# Patient Record
Sex: Male | Born: 1940 | Race: White | Hispanic: No | Marital: Married | Smoking: Never smoker
Health system: Southern US, Community
[De-identification: ages and names within clinical notes are randomized; demographics above are authoritative.]

## PROBLEM LIST (undated history)

## (undated) DIAGNOSIS — R350 Frequency of micturition: Secondary | ICD-10-CM

## (undated) DIAGNOSIS — T7840XA Allergy, unspecified, initial encounter: Secondary | ICD-10-CM

## (undated) DIAGNOSIS — M545 Low back pain, unspecified: Secondary | ICD-10-CM

## (undated) DIAGNOSIS — K219 Gastro-esophageal reflux disease without esophagitis: Secondary | ICD-10-CM

## (undated) DIAGNOSIS — K760 Fatty (change of) liver, not elsewhere classified: Secondary | ICD-10-CM

## (undated) DIAGNOSIS — G629 Polyneuropathy, unspecified: Secondary | ICD-10-CM

## (undated) DIAGNOSIS — C182 Malignant neoplasm of ascending colon: Principal | ICD-10-CM

## (undated) DIAGNOSIS — K5730 Diverticulosis of large intestine without perforation or abscess without bleeding: Secondary | ICD-10-CM

## (undated) DIAGNOSIS — J302 Other seasonal allergic rhinitis: Secondary | ICD-10-CM

## (undated) DIAGNOSIS — M1990 Unspecified osteoarthritis, unspecified site: Secondary | ICD-10-CM

## (undated) DIAGNOSIS — K8020 Calculus of gallbladder without cholecystitis without obstruction: Secondary | ICD-10-CM

## (undated) DIAGNOSIS — E291 Testicular hypofunction: Secondary | ICD-10-CM

## (undated) DIAGNOSIS — C801 Malignant (primary) neoplasm, unspecified: Secondary | ICD-10-CM

## (undated) DIAGNOSIS — H269 Unspecified cataract: Secondary | ICD-10-CM

## (undated) HISTORY — DX: Fatty (change of) liver, not elsewhere classified: K76.0

## (undated) HISTORY — PX: APPENDECTOMY: SHX54

## (undated) HISTORY — DX: Allergy, unspecified, initial encounter: T78.40XA

## (undated) HISTORY — DX: Malignant neoplasm of ascending colon: C18.2

## (undated) HISTORY — PX: COLON SURGERY: SHX602

## (undated) HISTORY — PX: LUMBAR LAMINECTOMY: SHX95

## (undated) HISTORY — PX: EYE SURGERY: SHX253

## (undated) HISTORY — DX: Diverticulosis of large intestine without perforation or abscess without bleeding: K57.30

## (undated) HISTORY — DX: Low back pain, unspecified: M54.50

## (undated) HISTORY — DX: Low back pain: M54.5

## (undated) HISTORY — DX: Gastro-esophageal reflux disease without esophagitis: K21.9

## (undated) HISTORY — PX: POLYPECTOMY: SHX149

## (undated) HISTORY — PX: CATARACT EXTRACTION: SUR2

## (undated) HISTORY — DX: Calculus of gallbladder without cholecystitis without obstruction: K80.20

## (undated) HISTORY — DX: Unspecified osteoarthritis, unspecified site: M19.90

## (undated) HISTORY — DX: Unspecified cataract: H26.9

## (undated) HISTORY — DX: Testicular hypofunction: E29.1

## (undated) HISTORY — DX: Polyneuropathy, unspecified: G62.9

## (undated) HISTORY — PX: TONSILLECTOMY: SUR1361

---

## 1964-07-18 HISTORY — PX: FOOT SURGERY: SHX648

## 1998-08-31 ENCOUNTER — Emergency Department (HOSPITAL_COMMUNITY): Admission: EM | Admit: 1998-08-31 | Discharge: 1998-08-31 | Payer: Self-pay | Admitting: Emergency Medicine

## 1998-09-01 ENCOUNTER — Encounter: Payer: Self-pay | Admitting: Emergency Medicine

## 1998-09-09 ENCOUNTER — Ambulatory Visit (HOSPITAL_COMMUNITY): Admission: RE | Admit: 1998-09-09 | Discharge: 1998-09-09 | Payer: Self-pay | Admitting: Gastroenterology

## 1998-09-09 ENCOUNTER — Encounter: Payer: Self-pay | Admitting: Gastroenterology

## 2000-02-24 ENCOUNTER — Encounter: Payer: Self-pay | Admitting: Internal Medicine

## 2000-02-24 ENCOUNTER — Ambulatory Visit (HOSPITAL_COMMUNITY): Admission: RE | Admit: 2000-02-24 | Discharge: 2000-02-24 | Payer: Self-pay | Admitting: Internal Medicine

## 2004-05-26 ENCOUNTER — Ambulatory Visit: Payer: Self-pay | Admitting: Internal Medicine

## 2004-06-23 ENCOUNTER — Ambulatory Visit: Payer: Self-pay | Admitting: Internal Medicine

## 2004-06-28 ENCOUNTER — Ambulatory Visit: Payer: Self-pay | Admitting: Internal Medicine

## 2004-07-26 ENCOUNTER — Ambulatory Visit: Payer: Self-pay | Admitting: Internal Medicine

## 2004-09-28 ENCOUNTER — Ambulatory Visit: Payer: Self-pay | Admitting: Internal Medicine

## 2004-10-05 ENCOUNTER — Ambulatory Visit: Payer: Self-pay | Admitting: Internal Medicine

## 2004-12-09 ENCOUNTER — Ambulatory Visit: Payer: Self-pay | Admitting: Internal Medicine

## 2004-12-29 ENCOUNTER — Ambulatory Visit: Payer: Self-pay | Admitting: Internal Medicine

## 2005-01-05 ENCOUNTER — Ambulatory Visit: Payer: Self-pay | Admitting: Internal Medicine

## 2005-03-10 ENCOUNTER — Ambulatory Visit: Payer: Self-pay | Admitting: Internal Medicine

## 2005-04-08 ENCOUNTER — Ambulatory Visit: Payer: Self-pay | Admitting: Internal Medicine

## 2005-05-10 ENCOUNTER — Ambulatory Visit: Payer: Self-pay | Admitting: Internal Medicine

## 2005-07-04 ENCOUNTER — Ambulatory Visit: Payer: Self-pay | Admitting: Internal Medicine

## 2005-07-19 ENCOUNTER — Ambulatory Visit: Payer: Self-pay | Admitting: Internal Medicine

## 2005-09-08 ENCOUNTER — Ambulatory Visit: Payer: Self-pay | Admitting: Internal Medicine

## 2005-10-25 ENCOUNTER — Ambulatory Visit: Payer: Self-pay | Admitting: Internal Medicine

## 2005-10-31 ENCOUNTER — Ambulatory Visit: Payer: Self-pay | Admitting: Internal Medicine

## 2005-11-21 ENCOUNTER — Ambulatory Visit: Payer: Self-pay | Admitting: Gastroenterology

## 2005-11-30 ENCOUNTER — Ambulatory Visit: Payer: Self-pay | Admitting: Gastroenterology

## 2006-03-02 ENCOUNTER — Ambulatory Visit: Payer: Self-pay | Admitting: Internal Medicine

## 2006-03-16 ENCOUNTER — Ambulatory Visit: Payer: Self-pay | Admitting: Internal Medicine

## 2006-04-04 ENCOUNTER — Ambulatory Visit: Payer: Self-pay | Admitting: Internal Medicine

## 2006-04-13 ENCOUNTER — Ambulatory Visit: Payer: Self-pay | Admitting: Internal Medicine

## 2006-05-19 ENCOUNTER — Ambulatory Visit: Payer: Self-pay | Admitting: Internal Medicine

## 2006-07-26 ENCOUNTER — Ambulatory Visit: Payer: Self-pay | Admitting: Internal Medicine

## 2006-10-27 ENCOUNTER — Ambulatory Visit: Payer: Self-pay | Admitting: Internal Medicine

## 2006-10-27 LAB — CONVERTED CEMR LAB
HCT: 43.1 % (ref 39.0–52.0)
Hemoglobin: 14.8 g/dL (ref 13.0–17.0)

## 2006-12-29 ENCOUNTER — Ambulatory Visit: Payer: Self-pay | Admitting: Internal Medicine

## 2006-12-29 LAB — CONVERTED CEMR LAB
ALT: 33 units/L (ref 0–40)
AST: 25 units/L (ref 0–37)
Bilirubin, Direct: 0.1 mg/dL (ref 0.0–0.3)
CO2: 31 meq/L (ref 19–32)
Calcium: 9.6 mg/dL (ref 8.4–10.5)
Chloride: 104 meq/L (ref 96–112)
Creatinine, Ser: 0.8 mg/dL (ref 0.4–1.5)
GFR calc non Af Amer: 104 mL/min
Glucose, Bld: 187 mg/dL — ABNORMAL HIGH (ref 70–99)
Hemoglobin: 15.2 g/dL (ref 13.0–17.0)
Hgb A1c MFr Bld: 7.5 % — ABNORMAL HIGH (ref 4.6–6.0)
TSH: 0.67 microintl units/mL (ref 0.35–5.50)
Total Bilirubin: 0.7 mg/dL (ref 0.3–1.2)
Vitamin B-12: 431 pg/mL (ref 211–911)

## 2007-02-16 DIAGNOSIS — E119 Type 2 diabetes mellitus without complications: Secondary | ICD-10-CM | POA: Insufficient documentation

## 2007-02-16 DIAGNOSIS — K5730 Diverticulosis of large intestine without perforation or abscess without bleeding: Secondary | ICD-10-CM | POA: Insufficient documentation

## 2007-02-16 DIAGNOSIS — K219 Gastro-esophageal reflux disease without esophagitis: Secondary | ICD-10-CM | POA: Insufficient documentation

## 2007-03-05 ENCOUNTER — Ambulatory Visit: Payer: Self-pay | Admitting: Internal Medicine

## 2007-03-05 LAB — CONVERTED CEMR LAB
Basophils Absolute: 0 10*3/uL (ref 0.0–0.1)
Eosinophils Absolute: 0.1 10*3/uL (ref 0.0–0.6)
Eosinophils Relative: 1.6 % (ref 0.0–5.0)
MCHC: 35.7 g/dL (ref 30.0–36.0)
MCV: 94.7 fL (ref 78.0–100.0)
Platelets: 242 10*3/uL (ref 150–400)
RBC: 4.54 M/uL (ref 4.22–5.81)
WBC: 5.9 10*3/uL (ref 4.5–10.5)

## 2007-05-07 ENCOUNTER — Encounter: Payer: Self-pay | Admitting: Internal Medicine

## 2007-05-07 ENCOUNTER — Ambulatory Visit: Payer: Self-pay | Admitting: Internal Medicine

## 2007-05-07 DIAGNOSIS — M545 Low back pain, unspecified: Secondary | ICD-10-CM | POA: Insufficient documentation

## 2007-05-07 DIAGNOSIS — M1990 Unspecified osteoarthritis, unspecified site: Secondary | ICD-10-CM | POA: Insufficient documentation

## 2007-05-07 LAB — CONVERTED CEMR LAB: Hemoglobin: 15.3 g/dL (ref 13.0–17.0)

## 2007-07-09 ENCOUNTER — Ambulatory Visit: Payer: Self-pay | Admitting: Internal Medicine

## 2007-07-09 DIAGNOSIS — G609 Hereditary and idiopathic neuropathy, unspecified: Secondary | ICD-10-CM | POA: Insufficient documentation

## 2007-07-10 LAB — CONVERTED CEMR LAB
Basophils Relative: 0.6 % (ref 0.0–1.0)
CO2: 32 meq/L (ref 19–32)
Calcium: 9.9 mg/dL (ref 8.4–10.5)
Chloride: 97 meq/L (ref 96–112)
Creatinine, Ser: 0.7 mg/dL (ref 0.4–1.5)
Eosinophils Relative: 1.1 % (ref 0.0–5.0)
GFR calc non Af Amer: 121 mL/min
Glucose, Bld: 98 mg/dL (ref 70–99)
Platelets: 243 10*3/uL (ref 150–400)
RBC: 4.91 M/uL (ref 4.22–5.81)
WBC: 7.5 10*3/uL (ref 4.5–10.5)

## 2007-10-09 ENCOUNTER — Ambulatory Visit: Payer: Self-pay | Admitting: Internal Medicine

## 2007-10-09 LAB — CONVERTED CEMR LAB: HCT: 47.1 % (ref 39.0–52.0)

## 2007-12-12 ENCOUNTER — Ambulatory Visit: Payer: Self-pay | Admitting: Internal Medicine

## 2007-12-14 LAB — CONVERTED CEMR LAB
BUN: 9 mg/dL (ref 6–23)
Eosinophils Relative: 0.6 % (ref 0.0–5.0)
GFR calc Af Amer: 125 mL/min
Glucose, Bld: 149 mg/dL — ABNORMAL HIGH (ref 70–99)
HCT: 48.6 % (ref 39.0–52.0)
Hemoglobin: 16.3 g/dL (ref 13.0–17.0)
Monocytes Absolute: 0.7 10*3/uL (ref 0.1–1.0)
Monocytes Relative: 11 % (ref 3.0–12.0)
Platelets: 280 10*3/uL (ref 150–400)
Potassium: 4.1 meq/L (ref 3.5–5.1)
WBC: 6.1 10*3/uL (ref 4.5–10.5)

## 2008-01-25 ENCOUNTER — Ambulatory Visit: Payer: Self-pay | Admitting: Internal Medicine

## 2008-01-25 LAB — CONVERTED CEMR LAB: HCT: 45.7 % (ref 39.0–52.0)

## 2008-05-02 ENCOUNTER — Ambulatory Visit: Payer: Self-pay | Admitting: Internal Medicine

## 2008-05-05 LAB — CONVERTED CEMR LAB
BUN: 7 mg/dL (ref 6–23)
Basophils Relative: 0.7 % (ref 0.0–3.0)
Calcium: 9.4 mg/dL (ref 8.4–10.5)
Creatinine, Ser: 0.6 mg/dL (ref 0.4–1.5)
Eosinophils Relative: 1.5 % (ref 0.0–5.0)
GFR calc Af Amer: 174 mL/min
Glucose, Bld: 177 mg/dL — ABNORMAL HIGH (ref 70–99)
HCT: 44.5 % (ref 39.0–52.0)
Hemoglobin: 15.8 g/dL (ref 13.0–17.0)
Monocytes Absolute: 0.6 10*3/uL (ref 0.1–1.0)
Monocytes Relative: 12.5 % — ABNORMAL HIGH (ref 3.0–12.0)
Neutro Abs: 2.5 10*3/uL (ref 1.4–7.7)
RBC: 4.63 M/uL (ref 4.22–5.81)
RDW: 11.5 % (ref 11.5–14.6)
TSH: 1.13 microintl units/mL (ref 0.35–5.50)
WBC: 5 10*3/uL (ref 4.5–10.5)

## 2008-07-21 ENCOUNTER — Ambulatory Visit: Payer: Self-pay | Admitting: Internal Medicine

## 2008-07-21 DIAGNOSIS — N529 Male erectile dysfunction, unspecified: Secondary | ICD-10-CM

## 2008-07-21 DIAGNOSIS — R109 Unspecified abdominal pain: Secondary | ICD-10-CM | POA: Insufficient documentation

## 2008-07-21 DIAGNOSIS — R21 Rash and other nonspecific skin eruption: Secondary | ICD-10-CM | POA: Insufficient documentation

## 2008-07-21 DIAGNOSIS — K5732 Diverticulitis of large intestine without perforation or abscess without bleeding: Secondary | ICD-10-CM | POA: Insufficient documentation

## 2008-07-22 ENCOUNTER — Ambulatory Visit: Payer: Self-pay | Admitting: Internal Medicine

## 2008-07-23 LAB — CONVERTED CEMR LAB
ALT: 102 units/L — ABNORMAL HIGH (ref 0–53)
AST: 79 units/L — ABNORMAL HIGH (ref 0–37)
Albumin: 4.6 g/dL (ref 3.5–5.2)
Alkaline Phosphatase: 60 units/L (ref 39–117)
BUN: 7 mg/dL (ref 6–23)
Bilirubin, Direct: 0.1 mg/dL (ref 0.0–0.3)
CO2: 29 meq/L (ref 19–32)
Eosinophils Relative: 0.6 % (ref 0.0–5.0)
GFR calc Af Amer: 146 mL/min
Glucose, Bld: 137 mg/dL — ABNORMAL HIGH (ref 70–99)
HCT: 47.8 % (ref 39.0–52.0)
Hemoglobin, Urine: NEGATIVE
Hemoglobin: 16.6 g/dL (ref 13.0–17.0)
Leukocytes, UA: NEGATIVE
Lymphocytes Relative: 27.3 % (ref 12.0–46.0)
Monocytes Absolute: 0.7 10*3/uL (ref 0.1–1.0)
Monocytes Relative: 9.9 % (ref 3.0–12.0)
Nitrite: NEGATIVE
Platelets: 229 10*3/uL (ref 150–400)
Potassium: 3.8 meq/L (ref 3.5–5.1)
Sodium: 139 meq/L (ref 135–145)
Total Protein, Urine: NEGATIVE mg/dL
Total Protein: 7.9 g/dL (ref 6.0–8.3)
WBC: 6.9 10*3/uL (ref 4.5–10.5)
pH: 5.5 (ref 5.0–8.0)

## 2008-07-28 ENCOUNTER — Telehealth: Payer: Self-pay | Admitting: Internal Medicine

## 2008-08-06 ENCOUNTER — Ambulatory Visit: Payer: Self-pay | Admitting: Endocrinology

## 2008-08-06 DIAGNOSIS — R945 Abnormal results of liver function studies: Secondary | ICD-10-CM

## 2008-08-06 LAB — CONVERTED CEMR LAB
ALT: 64 units/L — ABNORMAL HIGH (ref 0–53)
AST: 39 units/L — ABNORMAL HIGH (ref 0–37)
Albumin: 3.9 g/dL (ref 3.5–5.2)
Total Bilirubin: 1 mg/dL (ref 0.3–1.2)
Total Protein: 7.1 g/dL (ref 6.0–8.3)

## 2008-08-11 DIAGNOSIS — K589 Irritable bowel syndrome without diarrhea: Secondary | ICD-10-CM

## 2008-08-13 DIAGNOSIS — K760 Fatty (change of) liver, not elsewhere classified: Secondary | ICD-10-CM

## 2008-08-14 ENCOUNTER — Ambulatory Visit: Payer: Self-pay | Admitting: Gastroenterology

## 2008-08-15 ENCOUNTER — Encounter: Payer: Self-pay | Admitting: Gastroenterology

## 2008-08-22 ENCOUNTER — Ambulatory Visit (HOSPITAL_COMMUNITY): Admission: RE | Admit: 2008-08-22 | Discharge: 2008-08-22 | Payer: Self-pay | Admitting: Gastroenterology

## 2008-08-22 LAB — CONVERTED CEMR LAB
Amylase: 55 units/L (ref 27–131)
Ferritin: 615.4 ng/mL — ABNORMAL HIGH (ref 22.0–322.0)
Lipase: 20 units/L (ref 11.0–59.0)
Saturation Ratios: 62.3 % — ABNORMAL HIGH (ref 20.0–50.0)
Vitamin B-12: 840 pg/mL (ref 211–911)

## 2008-08-26 ENCOUNTER — Ambulatory Visit: Payer: Self-pay | Admitting: Internal Medicine

## 2008-08-26 ENCOUNTER — Ambulatory Visit: Payer: Self-pay | Admitting: Gastroenterology

## 2008-08-26 LAB — CONVERTED CEMR LAB: HCT: 47 % (ref 39.0–52.0)

## 2008-09-01 ENCOUNTER — Telehealth: Payer: Self-pay | Admitting: Internal Medicine

## 2008-09-24 ENCOUNTER — Telehealth: Payer: Self-pay | Admitting: Internal Medicine

## 2008-10-10 ENCOUNTER — Ambulatory Visit: Payer: Self-pay | Admitting: Internal Medicine

## 2008-10-14 LAB — CONVERTED CEMR LAB
ALT: 74 units/L — ABNORMAL HIGH (ref 0–53)
AST: 54 units/L — ABNORMAL HIGH (ref 0–37)
Albumin: 4 g/dL (ref 3.5–5.2)
Alkaline Phosphatase: 72 units/L (ref 39–117)
CO2: 33 meq/L — ABNORMAL HIGH (ref 19–32)
Chloride: 100 meq/L (ref 96–112)
GFR calc non Af Amer: 120.11 mL/min (ref >60)
Glucose, Bld: 174 mg/dL — ABNORMAL HIGH (ref 70–99)
Hemoglobin: 15.9 g/dL (ref 13.0–17.0)
Potassium: 4.4 meq/L (ref 3.5–5.1)
Sodium: 138 meq/L (ref 135–145)

## 2008-10-28 ENCOUNTER — Telehealth: Payer: Self-pay | Admitting: Internal Medicine

## 2008-11-20 ENCOUNTER — Encounter: Payer: Self-pay | Admitting: Gastroenterology

## 2009-01-13 ENCOUNTER — Encounter: Payer: Self-pay | Admitting: Internal Medicine

## 2009-01-14 ENCOUNTER — Ambulatory Visit: Payer: Self-pay | Admitting: Internal Medicine

## 2009-01-20 LAB — CONVERTED CEMR LAB
Basophils Absolute: 0 10*3/uL (ref 0.0–0.1)
Eosinophils Relative: 1.9 % (ref 0.0–5.0)
HCT: 45.5 % (ref 39.0–52.0)
Hemoglobin: 15.7 g/dL (ref 13.0–17.0)
Lymphs Abs: 1.8 10*3/uL (ref 0.7–4.0)
Monocytes Relative: 13.2 % — ABNORMAL HIGH (ref 3.0–12.0)
Neutro Abs: 2.5 10*3/uL (ref 1.4–7.7)
RDW: 11.8 % (ref 11.5–14.6)

## 2009-02-16 ENCOUNTER — Encounter: Payer: Self-pay | Admitting: Internal Medicine

## 2009-03-27 ENCOUNTER — Ambulatory Visit: Payer: Self-pay | Admitting: Internal Medicine

## 2009-03-27 LAB — CONVERTED CEMR LAB: Hemoglobin: 16.1 g/dL (ref 13.0–17.0)

## 2009-06-19 ENCOUNTER — Ambulatory Visit: Payer: Self-pay | Admitting: Internal Medicine

## 2009-06-19 LAB — CONVERTED CEMR LAB
AST: 24 units/L (ref 0–37)
Albumin: 3.9 g/dL (ref 3.5–5.2)
CO2: 32 meq/L (ref 19–32)
Chloride: 101 meq/L (ref 96–112)
Cholesterol: 190 mg/dL (ref 0–200)
HDL: 33.5 mg/dL — ABNORMAL LOW (ref >39.00)
LDL Cholesterol: 129 mg/dL — ABNORMAL HIGH (ref 0–99)
Sodium: 140 meq/L (ref 135–145)
Triglycerides: 140 mg/dL (ref 0.0–149.0)
VLDL: 28 mg/dL (ref 0.0–40.0)

## 2009-06-26 ENCOUNTER — Ambulatory Visit: Payer: Self-pay | Admitting: Internal Medicine

## 2009-06-26 LAB — CONVERTED CEMR LAB
Eosinophils Absolute: 0.1 10*3/uL (ref 0.0–0.7)
Eosinophils Relative: 2 % (ref 0.0–5.0)
HCT: 45.8 % (ref 39.0–52.0)
Lymphs Abs: 1.4 10*3/uL (ref 0.7–4.0)
MCHC: 34.4 g/dL (ref 30.0–36.0)
MCV: 97.5 fL (ref 78.0–100.0)
Monocytes Absolute: 0.7 10*3/uL (ref 0.1–1.0)
Neutrophils Relative %: 54.8 % (ref 43.0–77.0)
Platelets: 220 10*3/uL (ref 150.0–400.0)
WBC: 5 10*3/uL (ref 4.5–10.5)

## 2009-07-18 HISTORY — PX: HEMORRHOID SURGERY: SHX153

## 2009-09-10 ENCOUNTER — Telehealth: Payer: Self-pay | Admitting: Internal Medicine

## 2009-09-23 ENCOUNTER — Ambulatory Visit: Payer: Self-pay | Admitting: Internal Medicine

## 2009-09-23 LAB — CONVERTED CEMR LAB
Albumin: 4 g/dL (ref 3.5–5.2)
Alkaline Phosphatase: 76 units/L (ref 39–117)
BUN: 7 mg/dL (ref 6–23)
Calcium: 9.4 mg/dL (ref 8.4–10.5)
Eosinophils Relative: 1.2 % (ref 0.0–5.0)
GFR calc non Af Amer: 102.66 mL/min (ref >60)
Glucose, Bld: 150 mg/dL — ABNORMAL HIGH (ref 70–99)
HCT: 49.4 % (ref 39.0–52.0)
Hemoglobin: 16.3 g/dL (ref 13.0–17.0)
Lymphs Abs: 1.5 10*3/uL (ref 0.7–4.0)
MCV: 98.6 fL (ref 78.0–100.0)
Monocytes Relative: 10.2 % (ref 3.0–12.0)
Neutro Abs: 2.9 10*3/uL (ref 1.4–7.7)
Sodium: 140 meq/L (ref 135–145)
Total Bilirubin: 0.7 mg/dL (ref 0.3–1.2)
WBC: 5 10*3/uL (ref 4.5–10.5)

## 2009-09-24 ENCOUNTER — Ambulatory Visit: Payer: Self-pay | Admitting: Internal Medicine

## 2009-09-24 DIAGNOSIS — M26629 Arthralgia of temporomandibular joint, unspecified side: Secondary | ICD-10-CM | POA: Insufficient documentation

## 2009-11-09 ENCOUNTER — Telehealth: Payer: Self-pay | Admitting: Internal Medicine

## 2009-11-26 ENCOUNTER — Encounter: Payer: Self-pay | Admitting: Internal Medicine

## 2009-12-02 ENCOUNTER — Encounter: Payer: Self-pay | Admitting: Internal Medicine

## 2009-12-02 ENCOUNTER — Ambulatory Visit (HOSPITAL_COMMUNITY): Admission: RE | Admit: 2009-12-02 | Discharge: 2009-12-02 | Payer: Self-pay | Admitting: General Surgery

## 2009-12-10 ENCOUNTER — Encounter: Payer: Self-pay | Admitting: Internal Medicine

## 2009-12-16 ENCOUNTER — Encounter: Payer: Self-pay | Admitting: Internal Medicine

## 2009-12-17 ENCOUNTER — Telehealth: Payer: Self-pay | Admitting: Internal Medicine

## 2009-12-18 ENCOUNTER — Encounter: Payer: Self-pay | Admitting: Internal Medicine

## 2009-12-18 ENCOUNTER — Telehealth: Payer: Self-pay | Admitting: Internal Medicine

## 2010-01-08 ENCOUNTER — Ambulatory Visit: Payer: Self-pay | Admitting: Internal Medicine

## 2010-01-08 DIAGNOSIS — K649 Unspecified hemorrhoids: Secondary | ICD-10-CM | POA: Insufficient documentation

## 2010-01-08 LAB — CONVERTED CEMR LAB
Basophils Absolute: 0 10*3/uL (ref 0.0–0.1)
Eosinophils Absolute: 0.1 10*3/uL (ref 0.0–0.7)
HCT: 43.2 % (ref 39.0–52.0)
Hemoglobin: 15.1 g/dL (ref 13.0–17.0)
Lymphs Abs: 2.1 10*3/uL (ref 0.7–4.0)
MCHC: 35 g/dL (ref 30.0–36.0)
MCV: 95.4 fL (ref 78.0–100.0)
Monocytes Absolute: 0.5 10*3/uL (ref 0.1–1.0)
Monocytes Relative: 8.6 % (ref 3.0–12.0)
Neutro Abs: 3.1 10*3/uL (ref 1.4–7.7)
RDW: 12.2 % (ref 11.5–14.6)

## 2010-01-19 ENCOUNTER — Encounter: Payer: Self-pay | Admitting: Internal Medicine

## 2010-04-12 ENCOUNTER — Ambulatory Visit: Payer: Self-pay | Admitting: Internal Medicine

## 2010-04-13 LAB — CONVERTED CEMR LAB
ALT: 22 units/L (ref 0–53)
BUN: 8 mg/dL (ref 6–23)
Basophils Relative: 0.5 % (ref 0.0–3.0)
Bilirubin Urine: NEGATIVE
Chloride: 101 meq/L (ref 96–112)
Eosinophils Relative: 1.3 % (ref 0.0–5.0)
HCT: 46.2 % (ref 39.0–52.0)
Hemoglobin, Urine: NEGATIVE
Hgb A1c MFr Bld: 6.5 % (ref 4.6–6.5)
Ketones, ur: NEGATIVE mg/dL
Lymphs Abs: 2.2 10*3/uL (ref 0.7–4.0)
MCV: 96.9 fL (ref 78.0–100.0)
Monocytes Absolute: 0.7 10*3/uL (ref 0.1–1.0)
Platelets: 248 10*3/uL (ref 150.0–400.0)
Potassium: 5.6 meq/L — ABNORMAL HIGH (ref 3.5–5.1)
TSH: 0.56 microintl units/mL (ref 0.35–5.50)
Total Bilirubin: 0.8 mg/dL (ref 0.3–1.2)
Total Protein: 7.2 g/dL (ref 6.0–8.3)
Urine Glucose: NEGATIVE mg/dL
Urobilinogen, UA: 0.2 (ref 0.0–1.0)
VLDL: 30.2 mg/dL (ref 0.0–40.0)
WBC: 6 10*3/uL (ref 4.5–10.5)

## 2010-05-29 ENCOUNTER — Ambulatory Visit: Payer: Self-pay | Admitting: Family Medicine

## 2010-05-29 DIAGNOSIS — J018 Other acute sinusitis: Secondary | ICD-10-CM

## 2010-07-01 ENCOUNTER — Ambulatory Visit: Payer: Self-pay | Admitting: Internal Medicine

## 2010-07-14 ENCOUNTER — Ambulatory Visit: Payer: Self-pay | Admitting: Internal Medicine

## 2010-07-14 DIAGNOSIS — F4321 Adjustment disorder with depressed mood: Secondary | ICD-10-CM

## 2010-07-14 LAB — CONVERTED CEMR LAB
CO2: 32 meq/L (ref 19–32)
Calcium: 9.7 mg/dL (ref 8.4–10.5)
Creatinine, Ser: 0.7 mg/dL (ref 0.4–1.5)
Eosinophils Absolute: 0.1 10*3/uL (ref 0.0–0.7)
Glucose, Bld: 127 mg/dL — ABNORMAL HIGH (ref 70–99)
MCHC: 34.6 g/dL (ref 30.0–36.0)
MCV: 96.8 fL (ref 78.0–100.0)
Monocytes Absolute: 0.7 10*3/uL (ref 0.1–1.0)
Neutrophils Relative %: 51 % (ref 43.0–77.0)
Platelets: 239 10*3/uL (ref 150.0–400.0)
RDW: 12.3 % (ref 11.5–14.6)

## 2010-08-17 NOTE — Progress Notes (Signed)
Summary: test strip pa  Phone Note From Pharmacy   Summary of Call: PA request---test strips. LM with spouse to call back to office. Initial call taken by: Gabriel Erickson,  November 09, 2009 4:41 PM  Follow-up for Phone Call        Spoke with patient and insurance tells him that we need to do prior authorization. I am aware that they never let me do prior authorization for these. Patient is aware that I will put a log up front for pick up/completion and we will submit it with letter of appeal. Follow-up by: Gabriel Erickson,  November 10, 2009 11:26 AM

## 2010-08-17 NOTE — Progress Notes (Signed)
Summary: Test strip limitation  Phone Note From Pharmacy   Caller: [employer] [phone] Call For: ID:  [id number]  Summary of Call: Spoke with [employer] in regards to patient test strips. A PA cannot be done, the patient plan only covers 153 per 90 days. Any additional qty should be paid for out of pocket unless MD wants to try to appeal this decision. Please advise. Initial call taken by: Lucious Groves,  September 10, 2009 8:31 AM  Follow-up for Phone Call        Noted. Check CBG less often 1-2 times a day as needed Follow-up by: Tresa Garter MD,  September 10, 2009 12:26 PM  Additional Follow-up for Phone Call Additional follow up Details #1::        left message on machine to call back to office. Additional Follow-up by: Lucious Groves,  September 11, 2009 9:49 AM    Additional Follow-up for Phone Call Additional follow up Details #2::    [phone]. Pt returning [facility] call. Please call him back. Follow-up by: Verdell Face,  September 11, 2009 3:54 PM  Additional Follow-up for Phone Call Additional follow up Details #3:: Details for Additional Follow-up Action Taken: Patient notified. Patient tests two times a day and gets 50 strips for 25 days. Additional Follow-up by: Lucious Groves,  September 11, 2009 4:00 PM

## 2010-08-17 NOTE — Assessment & Plan Note (Signed)
Summary: COUGH/CONGESTION/RCD   Vital Signs:  Patient profile:   70 year old male Weight:      191 pounds Temp:     98.5 degrees F Pulse rate:   73 / minute BP sitting:   140 / 70  (left arm) Cuff size:   regular  Vitals Entered By: Lamar Sprinkles, CMA (May 29, 2010 11:50 AM) CC: sinus congestion, slight cough /SD   History of Present Illness: 70 yo with 2 days of nasal congestion, slight cough(non productive). No fevers or chills. No wheezing or SOB.  Throat a little irritated. Using Mucinex D but it causes urinary retention. Also using nasal sprays.    Current Medications (verified): 1)  Methadone Hcl 10 Mg  Tabs (Methadone Hcl) .... 2 Tabs By Mouth Qid As Needed Pain Please,  Fill On or After 07/08/10 2)  Diazepam 5 Mg Tabs (Diazepam) .Marland Kitchen.. 1 By Mouth By Mouth Three Times A Day As Needed Cramps or Insomnia 3)  Glimepiride 4 Mg Tabs (Glimepiride) .Marland Kitchen.. 1 By Mouth Bid 4)  Vitamin-B Complex   Tabs (B Complex Vitamins) .Marland Kitchen.. 1 Qd 5)  Vitamin D3 1000 Unit  Tabs (Cholecalciferol) .Marland Kitchen.. 1 Qd 6)  Onetouch Ultra Test  Strp (Glucose Blood) .Marland Kitchen.. 1 Two Times A Day Dx 250.00 Flucuating Cbgs 7)  Accu-Chek Softclix Lancets  Misc (Lancets) .... Two Times A Day 8)  Zantac 75 75 Mg Tabs (Ranitidine Hcl) .... Once Daily 9)  Triamcinolone Acetonide 0.5 % Crea (Triamcinolone Acetonide) .... Apply Bid To Affected Area 10)  Allegra-D Allergy & Congestion 180-240 Mg Xr24h-Tab (Fexofenadine-Pseudoephedrine) .Marland Kitchen.. 1 By Mouth Once Daily As Needed Allergy 11)  Azithromycin 250 Mg  Tabs (Azithromycin) .... 2 By  Mouth Today and Then 1 Daily For 4 Days 12)  Fluticasone Propionate 50 Mcg/act  Susp (Fluticasone Propionate) .... 2 Sprays Each Nostril Once Daily  Allergies (verified): 1)  ! * Viagria 2)  ! * Crestor 3)  ! * Duragesic 4)  ! 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 hypotension Diabetes mellitus, type II Diverticulosis, colon GERD hypogonalism Low back pain Osteoarthritis Peripheral neuropathy Constipation  Past Surgical History: Last updated: 01/08/2010 Lumbar laminectomy Appendectomy Surgery on left foot 1966 Tonsillectomy Hemorrhoidectomy 2011 Dr Purnell Shoemaker  Family History: Last updated: 06/26/2009 Family History Diabetes 1st degree relative Family History Hypertension Family History of Colon Cancer: uncle Sister cancer of biliary duct and tube he has a sister with hemochromatosis S liver ca  Social History: Last updated: 08/26/2008 Retired Married Never Smoked 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 ENT:  Complains of nasal congestion, postnasal drainage, sinus pressure, and sore throat. CV:  Denies chest pain or discomfort. Resp:  Complains of cough; denies shortness of breath, sputum productive, and wheezing.  Physical Exam  General:  Well developed, well nourished, no acute distress VSS, non toxic appearing. Ears:  wax B Nose:  slight erythema and edema. Sinuses NTTP Mouth:  Oral mucosa and oropharynx without lesions or exudates.  Teeth in good repair. Lungs:  Clear throughout to auscultation. Heart:  Regular rate and rhythm; no murmurs, rubs,  or bruits. Psych:  Alert and cooperative. Normal  mood and affect.   Impression & Recommendations:  Problem # 1:  OTHER ACUTE SINUSITIS (ICD-461.8) Assessment New Likely viral. Advised supportive care as per pt instructions. Refilled his flonas. Given printed rx for Zpack to fill if symptoms do not improve in next several days. His updated medication list for this problem includes:    Allegra-d Allergy & Congestion 180-240  Mg Xr24h-tab (Fexofenadine-pseudoephedrine) .Marland Kitchen... 1 by mouth once daily as needed allergy    Azithromycin 250 Mg Tabs (Azithromycin) .Marland Kitchen... 2 by  mouth today and then 1 daily for 4 days    Fluticasone Propionate 50 Mcg/act Susp (Fluticasone propionate) .Marland Kitchen... 2 sprays each nostril once daily  Complete Medication List: 1)  Methadone Hcl 10 Mg Tabs (Methadone hcl) .... 2 tabs by mouth qid as needed pain please,  fill on or after 07/08/10 2)  Diazepam 5 Mg Tabs (Diazepam) .Marland Kitchen.. 1 by mouth by mouth three times a day as needed cramps or insomnia 3)  Glimepiride 4 Mg Tabs (Glimepiride) .Marland Kitchen.. 1 by mouth bid 4)  Vitamin-b Complex Tabs (B complex vitamins) .Marland Kitchen.. 1 qd 5)  Vitamin D3 1000 Unit Tabs (Cholecalciferol) .Marland Kitchen.. 1 qd 6)  Onetouch Ultra Test Strp (Glucose blood) .Marland Kitchen.. 1 two times a day dx 250.00 flucuating cbgs 7)  Accu-chek Softclix Lancets Misc (Lancets) .... Two times a day 8)  Zantac 75 75 Mg Tabs (Ranitidine hcl) .... Once daily 9)  Triamcinolone Acetonide 0.5 % Crea (Triamcinolone acetonide) .... Apply bid to affected area 10)  Allegra-d Allergy & Congestion 180-240 Mg Xr24h-tab (Fexofenadine-pseudoephedrine) .Marland Kitchen.. 1 by mouth once daily as needed allergy 11)  Azithromycin 250 Mg Tabs (Azithromycin) .... 2 by  mouth today and then 1 daily for 4 days 12)  Fluticasone Propionate 50 Mcg/act Susp (Fluticasone propionate) .... 2 sprays each nostril once daily  Patient Instructions: 1)  Acute sinusitis symptoms for less than 7 days are not helped by antibiotics. Use warm moist compresses, saline rinses.  Fill your Zpack if no improvement in 5-7 days, sooner if increasing pain, fever, or new symptoms.  Prescriptions: FLUTICASONE PROPIONATE 50 MCG/ACT  SUSP (FLUTICASONE PROPIONATE) 2 sprays each nostril once daily  #1 vial x 3   Entered and Authorized by:   Ruthe Mannan MD   Signed by:   Ruthe Mannan MD on 05/29/2010   Method used:   [facility] to        [facility]  [id number] (retail)       [address]       [place], [place]  [address]       Ph: [phone] or [phone]       Fax: [phone]   RxID:   [id number] AZITHROMYCIN 250 MG  TABS (AZITHROMYCIN) 2 by  mouth today and then 1 daily for 4 days  #6 x 0   Entered and Authorized by:   Ruthe Mannan MD   Signed by:   Ruthe Mannan MD on 05/29/2010   Method used:   Print then Give to Patient   RxID:   [id number]    Orders Added: 1)  Est. Patient Level III [87564]

## 2010-08-17 NOTE — Assessment & Plan Note (Signed)
Summary: 3 MO ROV /NWS  #   Vital Signs:  Patient profile:   70 year old male Weight:      190 pounds BMI:     25.86 Temp:     97.6 degrees F oral Pulse rate:   93 / minute BP sitting:   156 / 80  (left arm)  Vitals Entered By: Tora Perches (September 24, 2009 10:35 AM) CC: f/u Is Patient Diabetic? Yes   Primary Care Provider:  Sula Soda, MD  CC:  f/u.  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 * Viagria 2)  ! * Crestor 3)  ! * Duragesic 4)  ! 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         Stressed, LBP.  Physical Exam  General:  Well developed, well nourished, no acute distress Head:  R TMJ tender Ears:  wax B Nose:  External nasal examination shows no deformity or inflammation. Nasal mucosa are pink and moist without lesions or exudates. Mouth:  Oral mucosa and oropharynx without lesions or exudates.  Teeth in good repair. Lungs:  Clear throughout to auscultation. Heart:  Regular rate and rhythm; no murmurs, rubs,  or bruits. Abdomen:  Soft, nontender and nondistended. No masses, hepatosplenomegaly or  hernias noted. Normal bowel sounds. Msk:  Lumbar-sacral spine is tender to palpation over paraspinal muscles and painfull with the ROM  Stiff LS spine Weak LE B Extremities:  No edema Neurologic:  Alert and  oriented x4 Skin:  7 mm erythematous papule on R prox buttock Psych:  Alert and cooperative. Normal mood and affect.   Impression & Recommendations:  Problem # 1:  TMJ PAIN (ICD-524.62) R Assessment New R tooth was broken. Soft food  Problem # 2:  DIABETES MELLITUS, TYPE II (ICD-250.00) Assessment: Unchanged  His updated medication list for this problem includes:    Glimepiride 4 Mg Tabs (Glimepiride) .Marland Kitchen... 1 by mouth bid  Labs Reviewed: Creat: 0.8 (09/23/2009)    Reviewed HgBA1c results: 7.3 (09/23/2009)  7.5 (06/19/2009)  Problem # 3:  OSTEOARTHRITIS (ICD-715.90) Assessment: Unchanged  His updated medication list for this problem includes:    Methadone Hcl 10 Mg Tabs (Methadone hcl) .Marland Kitchen... 2 tabs by mouth qid as needed pain please,  fill on or after 01/06/10  Problem # 4:  LOW BACK PAIN (ICD-724.2) Assessment: Unchanged  His updated medication list for this problem includes:    Methadone Hcl 10 Mg Tabs (Methadone hcl) .Marland Kitchen... 2 tabs by mouth qid as needed pain please,  fill on or after 01/06/10  Complete Medication List: 1)  Methadone Hcl 10 Mg Tabs (Methadone hcl) .... 2 tabs by mouth qid as needed pain please,  fill on or after 01/06/10 2)  Diazepam 5 Mg Tabs (Diazepam) .Marland KitchenMarland KitchenMarland Kitchen  1 by mouth by mouth three times a day as needed cramps or insomnia 3)  Glimepiride 4 Mg Tabs (Glimepiride) .Marland Kitchen.. 1 by mouth bid 4)  Vitamin-b Complex Tabs (B complex vitamins) .Marland Kitchen.. 1 qd 5)  Vitamin D3 1000 Unit Tabs (Cholecalciferol) .Marland Kitchen.. 1 qd 6)  Onetouch Ultra Test Strp (Glucose blood) .Marland Kitchen.. 1a day prn 7)  Accu-chek Softclix Lancets Misc (Lancets) .... Two times a day 8)  Zantac 75 75 Mg Tabs (Ranitidine hcl) .... Once daily 9)  Triamcinolone Acetonide 0.5 % Crea (Triamcinolone acetonide) ....  Apply bid to affected area  Patient Instructions: 1)  Please schedule a follow-up appointment in 3 months. 2)  BMP prior to visit, ICD-9: 3)  Hepatic Panel prior to visit, ICD-9: 4)  CBC w/ Diff prior to visit, ICD-9: 5)  Phlebotomy  995.20  250.00 6)  HbgA1C prior to visit, ICD-9: 7)  Irrigate ears Prescriptions: METHADONE HCL 10 MG  TABS (METHADONE HCL) 2 tabs by mouth qid as needed pain Please,  fill on or after 01/06/10  #240 x 0   Entered and Authorized by:   Tresa Garter MD   Signed by:   Tresa Garter MD on 09/24/2009   Method used:   Print then Give to Patient   RxID:   [id number] METHADONE HCL 10 MG  TABS (METHADONE HCL) 2 tabs by mouth qid as needed pain Please,  fill on or after 12/06/09  #240 x 0   Entered and Authorized by:   Tresa Garter MD   Signed by:   Tresa Garter MD on 09/24/2009   Method used:   Print then Give to Patient   RxID:   [id number] METHADONE HCL 10 MG  TABS (METHADONE HCL) 2 tabs by mouth qid as needed pain Please,  fill on or after 11/06/09  #240 x 0   Entered and Authorized by:   Tresa Garter MD   Signed by:   Tresa Garter MD on 09/24/2009   Method used:   Print then Give to Patient   RxID:   [id number]

## 2010-08-17 NOTE — Assessment & Plan Note (Signed)
Summary: 3 mo rov /nws  #   Vital Signs:  Patient profile:   70 year old male Height:      72 inches Weight:      180 pounds BMI:     24.50 O2 Sat:      95 % on Room air Temp:     97.5 degrees F oral Pulse rate:   70 / minute Resp:     16 per minute BP sitting:   160 / 88  (right arm) Cuff size:   regular  Vitals Entered By: Lanier Prude, CMA(AAMA) (January 08, 2010 11:19 AM)  O2 Flow:  Room air CC: 3 mo f/u Is Patient Diabetic? Yes Comments pt states he recently had hemorrhoidectomy by Dr. Purnell Shoemaker in May 2011.   Primary Care Provider:  Sula Soda, MD  CC:  3 mo f/u.  History of Present Illness: The patient presents for a follow up of hypertension, diabetes, hyperlipidemia, hemorroids - s/p surgery. C/o wt loss after hemorrhoid surgery  Current Medications (verified): 1)  Methadone Hcl 10 Mg  Tabs (Methadone Hcl) .... 2 Tabs By Mouth Qid As Needed Pain Please,  Fill On or After 01/06/10 2)  Diazepam 5 Mg Tabs (Diazepam) .Marland Kitchen.. 1 By Mouth By Mouth Three Times A Day As Needed Cramps or Insomnia 3)  Glimepiride 4 Mg Tabs (Glimepiride) .Marland Kitchen.. 1 By Mouth Bid 4)  Vitamin-B Complex   Tabs (B Complex Vitamins) .Marland Kitchen.. 1 Qd 5)  Vitamin D3 1000 Unit  Tabs (Cholecalciferol) .Marland Kitchen.. 1 Qd 6)  Onetouch Ultra Test  Strp (Glucose Blood) .Marland Kitchen.. 1 Two Times A Day Dx 250.00 Flucuating Cbgs 7)  Accu-Chek Softclix Lancets  Misc (Lancets) .... Two Times A Day 8)  Zantac 75 75 Mg Tabs (Ranitidine Hcl) .... Once Daily 9)  Triamcinolone Acetonide 0.5 % Crea (Triamcinolone Acetonide) .... Apply Bid To Affected Area  Allergies (verified): 1)  ! * Viagria 2)  ! * Crestor 3)  ! * Duragesic 4)  ! Penicillin 5)  Cipro  Past History:  Past Medical History: Last updated: 08/06/2008 hemochreuato-heterozygote Fatty liver Poss GS vs sludge hypotension Diabetes mellitus, type II Diverticulosis, colon GERD hypogonalism Low back pain Osteoarthritis Peripheral neuropathy Constipation  Past  Surgical History: Lumbar laminectomy Appendectomy Surgery on left foot 1966 Tonsillectomy Hemorrhoidectomy 2011 Dr Purnell Shoemaker  Review of Systems       The patient complains of weight loss.  The patient denies fever, hoarseness, peripheral edema, abdominal pain, and hematochezia.    Physical Exam  General:  Well developed, well nourished, no acute distress Nose:  External nasal examination shows no deformity or inflammation. Nasal mucosa are pink and moist without lesions or exudates. Mouth:  Oral mucosa and oropharynx without lesions or exudates.  Teeth in good repair. Lungs:  Clear throughout to auscultation. Heart:  Regular rate and rhythm; no murmurs, rubs,  or bruits. Abdomen:  Soft, nontender and nondistended. No masses, hepatosplenomegaly or hernias noted. Normal bowel sounds. Msk:  Lumbar-sacral spine is tender to palpation over paraspinal muscles and painfull with the ROM  Stiff LS spine Weak LE B Extremities:  No edema Neurologic:  Alert and  oriented x4 Skin:  7 mm erythematous papule on R prox buttock Psych:  Alert and cooperative. Normal mood and affect.   Impression & Recommendations:  Problem # 1:  HEMORRHOIDS (ICD-455.6) Assessment Comment Only s/p surgery - recovering  Problem # 2:  ABNORMAL LIVER FUNCTION TESTS (ICD-794.8) Assessment: Comment Only The labs were reviewed with the patient.  Problem # 3:  DIABETES MELLITUS, TYPE II (ICD-250.00) Assessment: Comment Only  His updated medication list for this problem includes:    Glimepiride 4 Mg Tabs (Glimepiride) .Marland Kitchen... 1 by mouth bid He had labs incl A1c w/Dr Purnell Shoemaker  Problem # 4:  LOW BACK PAIN (ICD-724.2) Assessment: Unchanged  His updated medication list for this problem includes:    Methadone Hcl 10 Mg Tabs (Methadone hcl) .Marland Kitchen... 2 tabs by mouth qid as needed pain please,  fill on or after 04/08/10  Complete Medication List: 1)  Methadone Hcl 10 Mg Tabs (Methadone hcl) .... 2 tabs by mouth qid as  needed pain please,  fill on or after 04/08/10 2)  Diazepam 5 Mg Tabs (Diazepam) .Marland Kitchen.. 1 by mouth by mouth three times a day as needed cramps or insomnia 3)  Glimepiride 4 Mg Tabs (Glimepiride) .Marland Kitchen.. 1 by mouth bid 4)  Vitamin-b Complex Tabs (B complex vitamins) .Marland Kitchen.. 1 qd 5)  Vitamin D3 1000 Unit Tabs (Cholecalciferol) .Marland Kitchen.. 1 qd 6)  Onetouch Ultra Test Strp (Glucose blood) .Marland Kitchen.. 1 two times a day dx 250.00 flucuating cbgs 7)  Accu-chek Softclix Lancets Misc (Lancets) .... Two times a day 8)  Zantac 75 75 Mg Tabs (Ranitidine hcl) .... Once daily 9)  Triamcinolone Acetonide 0.5 % Crea (Triamcinolone acetonide) .... Apply bid to affected area  Other Orders: Phlebotomy,Therapuetic ([address]) T-Phlebotomy, Therapuetic ([address]) TLB-CBC Platelet - w/Differential (85025-CBCD)  Patient Instructions: 1)  Please schedule a follow-up appointment in 3 months well w/labs v70.0  995.20 250.00 . 2)  HbgA1C prior to visit, ICD-9: 3)  Therapeutic phlebotomy Prescriptions: GLIMEPIRIDE 4 MG TABS (GLIMEPIRIDE) 1 by mouth bid  #60 Tablet x 12   Entered and Authorized by:   Tresa Garter MD   Signed by:   Tresa Garter MD on 01/08/2010   Method used:   Print then Give to Patient   RxID:   [id number] METHADONE HCL 10 MG  TABS (METHADONE HCL) 2 tabs by mouth qid as needed pain Please,  fill on or after 04/08/10  #240 x 0   Entered and Authorized by:   Tresa Garter MD   Signed by:   Tresa Garter MD on 01/08/2010   Method used:   Print then Give to Patient   RxID:   [id number] METHADONE HCL 10 MG  TABS (METHADONE HCL) 2 tabs by mouth qid as needed pain Please,  fill on or after 03/08/10  #240 x 0   Entered and Authorized by:   Tresa Garter MD   Signed by:   Tresa Garter MD on 01/08/2010   Method used:   Print then Give to Patient   RxID:   [id number] DIAZEPAM 5 MG TABS (DIAZEPAM) 1 by mouth by mouth three times a day as needed cramps or insomnia  #90 x 5    Entered and Authorized by:   Tresa Garter MD   Signed by:   Tresa Garter MD on 01/08/2010   Method used:   Print then Give to Patient   RxID:   [id number] METHADONE HCL 10 MG  TABS (METHADONE HCL) 2 tabs by mouth qid as needed pain Please,  fill on or after 02/05/10  #240 x 0   Entered and Authorized by:   Tresa Garter MD   Signed by:   Tresa Garter MD on 01/08/2010   Method used:   Print then Give to Patient   RxID:   [id number]

## 2010-08-17 NOTE — Letter (Signed)
Summary: [facility]   Imported By: Sherian Rein 02/16/2010 14:15:18  _____________________________________________________________________  External Attachment:    Type:   Image     Comment:   External Document

## 2010-08-17 NOTE — Letter (Signed)
Summary: [facility]   Imported By: Sherian Rein 12/18/2009 15:06:42  _____________________________________________________________________  External Attachment:    Type:   Image     Comment:   External Document

## 2010-08-17 NOTE — Progress Notes (Signed)
Summary: REFILL   Phone Note Call from Patient   Summary of Call: Patient is requesting a call regarding rx.  Initial call taken by: Lamar Sprinkles, CMA,  December 17, 2009 4:46 PM  Follow-up for Phone Call        Sent in rx  Follow-up by: Lamar Sprinkles, CMA,  December 17, 2009 4:46 PM    New/Updated Medications: ONETOUCH ULTRA TEST  STRP (GLUCOSE BLOOD) 1 two times a day Dx 250.00 flucuating cbgs Prescriptions: ONETOUCH ULTRA TEST  STRP (GLUCOSE BLOOD) 1 two times a day Dx 250.00 flucuating cbgs  #3 mth x 3   Entered by:   Lamar Sprinkles, CMA   Authorized by:   Tresa Garter MD   Signed by:   Lamar Sprinkles, CMA on 12/17/2009   Method used:   Electronically to        [facility]* (mail-order)       [address], [address] [place]  [address]       Ph: [phone]       Fax: [phone]   RxID:   [id number]

## 2010-08-17 NOTE — Medication Information (Signed)
Summary: [facility] / [name]        [facility] / [name]        Imported By: Lennie Odor 12/21/2009 17:09:28  _____________________________________________________________________  External Attachment:    Type:   Image     Comment:   External Document  Appended Document: [facility] / [name]       issue corrected.

## 2010-08-17 NOTE — Medication Information (Signed)
[facility]: [name]   Imported By: Lester [place] 12/24/2009 08:35:03  _____________________________________________________________________  External Attachment:    Type:   Image     Comment:   External Document

## 2010-08-17 NOTE — Progress Notes (Signed)
Summary: [name]  Phone Note Other Incoming   Summary of Call: Spoke with patient and confirmed that he does want to use [facility] for diabetes testing supplies. Initial call taken by: Lucious Groves,  December 18, 2009 3:41 PM

## 2010-08-17 NOTE — Assessment & Plan Note (Signed)
Summary: 3 MO ROV /NWS   Vital Signs:  Patient profile:   70 year old male Height:      72 inches Weight:      186 pounds BMI:     25.32 Temp:     97.3 degrees F oral Pulse rate:   88 / minute Pulse rhythm:   regular Resp:     16 per minute BP sitting:   140 / 78  (left arm) Cuff size:   regular  Vitals Entered By: Lanier Prude, CMA(AAMA) (April 12, 2010 1:18 PM) CC: 3 mo f/u Is Patient Diabetic? No   Primary Care Provider:  Sula Soda, MD  CC:  3 mo f/u.  History of Present Illness: The patient presents for a follow up of DM, hemochromatosis, back pain, anxiety, depression. The patient presents for a preventive health examination  Patient past medical history, social history, and family history reviewed in detail no significant changes.  Patient is physically active. Depression is negative and mood is good. Hearing is normal, and able to perform activities of daily living. Risk of falling is negligible and home safety has been reviewed and is appropriate. Patient has normal height, weight, and visual acuity. Patient has been counseled on age-appropriate routine health concerns for screening and prevention. Education, counseling done.   Preventive Screening-Counseling & Management  Alcohol-Tobacco     Alcohol type: occasional     Alcohol Counseling: not indicated; patient does not drink     Smoking Status: never  Caffeine-Diet-Exercise     Caffeine Counseling: not indicated; caffeine use is not excessive or problematic     Diet Counseling: not indicated; diet is assessed to be healthy     Nutrition Referrals: no     Does Patient Exercise: yes     Depression Counseling: not indicated; screening negative for depression  Hep-HIV-STD-Contraception     Hepatitis Risk: no risk noted  Safety-Violence-Falls     Seat Belt Use: yes      Sexual History:  currently monogamous.    Current Medications (verified): 1)  Methadone Hcl 10 Mg  Tabs (Methadone Hcl) .... 2  Tabs By Mouth Qid As Needed Pain Please,  Fill On or After 04/08/10 2)  Diazepam 5 Mg Tabs (Diazepam) .Marland Kitchen.. 1 By Mouth By Mouth Three Times A Day As Needed Cramps or Insomnia 3)  Glimepiride 4 Mg Tabs (Glimepiride) .Marland Kitchen.. 1 By Mouth Bid 4)  Vitamin-B Complex   Tabs (B Complex Vitamins) .Marland Kitchen.. 1 Qd 5)  Vitamin D3 1000 Unit  Tabs (Cholecalciferol) .Marland Kitchen.. 1 Qd 6)  Onetouch Ultra Test  Strp (Glucose Blood) .Marland Kitchen.. 1 Two Times A Day Dx 250.00 Flucuating Cbgs 7)  Accu-Chek Softclix Lancets  Misc (Lancets) .... Two Times A Day 8)  Zantac 75 75 Mg Tabs (Ranitidine Hcl) .... Once Daily 9)  Triamcinolone Acetonide 0.5 % Crea (Triamcinolone Acetonide) .... Apply Bid To Affected Area  Allergies (verified): 1)  ! * Viagria 2)  ! * Crestor 3)  ! * Duragesic 4)  ! Penicillin 5)  Cipro  Past History:  Past Medical History: Last updated: 08/06/2008 hemochreuato-heterozygote Fatty liver Poss GS vs sludge hypotension Diabetes mellitus, type II Diverticulosis, colon GERD hypogonalism Low back pain Osteoarthritis Peripheral neuropathy Constipation  Family History: Last updated: 06/26/2009 Family History Diabetes 1st degree relative Family History Hypertension Family History of Colon Cancer: uncle Sister cancer of biliary duct and tube he has a sister with hemochromatosis S liver ca  Social History: Last updated: 08/26/2008 Retired Married  Never Smoked Caring for ill mother at home and a sister with brain injury at a NH Alcohol Use - yes  Social History: Hepatitis Risk:  no risk noted Seat Belt Use:  yes Sexual History:  currently monogamous  Review of Systems       The patient complains of difficulty walking.  The patient denies anorexia, fever, weight loss, weight gain, vision loss, decreased hearing, hoarseness, chest pain, syncope, dyspnea on exertion, peripheral edema, prolonged cough, headaches, hemoptysis, abdominal pain, melena, hematochezia, severe indigestion/heartburn, hematuria,  incontinence, genital sores, muscle weakness, suspicious skin lesions, transient blindness, depression, unusual weight change, abnormal bleeding, enlarged lymph nodes, angioedema, and testicular masses.         LBP  Physical Exam  General:  Well developed, well nourished, no acute distress Head:  Normocephalic and atraumatic without obvious abnormalities. No apparent alopecia or balding. Eyes:  No corneal or conjunctival inflammation noted. EOMI. Perrla. Ears:  wax B Nose:  External nasal examination shows no deformity or inflammation. Nasal mucosa are pink and moist without lesions or exudates. Mouth:  Oral mucosa and oropharynx without lesions or exudates.  Teeth in good repair. Neck:  No deformities, masses, or tenderness noted. Lungs:  Clear throughout to auscultation. Heart:  Regular rate and rhythm; no murmurs, rubs,  or bruits. Abdomen:  Soft, nontender and nondistended. No masses, hepatosplenomegaly or hernias noted. Normal bowel sounds. Rectal:  No external abnormalities noted. Normal sphincter tone. No rectal masses or tenderness. G(-) Genitalia:  Testes bilaterally descended without nodularity, tenderness or masses. No scrotal masses or lesions. No penis lesions or urethral discharge. Prostate:  1+ enlarged.   Msk:  Lumbar-sacral spine is tender to palpation over paraspinal muscles and painfull with the ROM  Stiff LS spine Weak LE B Pulses:  R and L carotid,radial,femoral,dorsalis pedis and posterior tibial pulses are full and equal bilaterally Extremities:  No edema Neurologic:  Alert and  oriented x4 Skin:  clear Cervical Nodes:  No lymphadenopathy noted Inguinal Nodes:  No significant adenopathy Psych:  Alert and cooperative. Normal mood and affect.   Impression & Recommendations:  Problem # 1:  HEALTH MAINTENANCE EXAM (ICD-V70.0) Assessment New  Overall doing well, age appropriate education and counseling updated and referral for appropriate preventive services done  unless declined, immunizations up to date or declined, diet counseling done if overweight, urged to quit smoking if smokes, most recent labs reviewed and current ordered if appropriate, ecg reviewed or declined (interpretation per ECG scanned in the EMR if done); information regarding Medicare Preventation requirements given if appropriate.  Labs is pending  Had colon by Dr Jarold Motto He had a rectar by Dr R He had an EKG this year  Orders: Medicare -1st Annual Wellness Visit [phone])  Problem # 2:  LOW BACK PAIN (ICD-724.2) Assessment: Unchanged  His updated medication list for this problem includes:    Methadone Hcl 10 Mg Tabs (Methadone hcl) .Marland Kitchen... 2 tabs by mouth qid as needed pain please,  fill on or after 07/08/10  Problem # 3:  OSTEOARTHRITIS (ICD-715.90) Assessment: Unchanged  His updated medication list for this problem includes:    Methadone Hcl 10 Mg Tabs (Methadone hcl) .Marland Kitchen... 2 tabs by mouth qid as needed pain please,  fill on or after 07/08/10  Problem # 4:  ABNORMAL LIVER FUNCTION TESTS (ICD-794.8) Assessment: Comment Only The labs were reviewed with the patient.   Problem # 5:  DIABETES MELLITUS, TYPE II (ICD-250.00) Assessment: Improved  His updated medication list for this problem  includes:    Glimepiride 4 Mg Tabs (Glimepiride) .Marland Kitchen... 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 2 tabs by mouth qid as needed pain please,  fill on or after 07/08/10 2)  Diazepam 5 Mg Tabs (Diazepam) .Marland Kitchen.. 1 by mouth by mouth three times a day as needed cramps or insomnia 3)  Glimepiride 4 Mg Tabs (Glimepiride) .Marland Kitchen.. 1 by mouth bid 4)  Vitamin-b Complex Tabs (B complex vitamins) .Marland Kitchen.. 1 qd 5)  Vitamin D3 1000 Unit Tabs (Cholecalciferol) .Marland Kitchen.. 1 qd 6)   Onetouch Ultra Test Strp (Glucose blood) .Marland Kitchen.. 1 two times a day dx 250.00 flucuating cbgs 7)  Accu-chek Softclix Lancets Misc (Lancets) .... Two times a day 8)  Zantac 75 75 Mg Tabs (Ranitidine hcl) .... Once daily 9)  Triamcinolone Acetonide 0.5 % Crea (Triamcinolone acetonide) .... Apply bid to affected area 10)  Allegra-d Allergy & Congestion 180-240 Mg Xr24h-tab (Fexofenadine-pseudoephedrine) .Marland Kitchen.. 1 by mouth once daily as needed allergy  Other Orders: Pneumococcal Vaccine (04540) Admin 1st Vaccine (98119)  Patient Instructions: 1)  Please schedule a follow-up appointment in 3 months 2)  HbgA1C prior to visit, ICD-9:250.00 3)  A1c 4)  BMET 250.00 5)  ferritin 6)  Go on Youtube (www.youtube.com) and look up "piriformis stretch", "Ileopsoas stretch", "IT band stretch" and "gluteus stretch". See the anatomy and learn the symptoms.  You can try to self-diagnose. Do the stretches - it may help!  Prescriptions: ALLEGRA-D ALLERGY & CONGESTION 180-240 MG XR24H-TAB (FEXOFENADINE-PSEUDOEPHEDRINE) 1 by mouth once daily as needed allergy  #30 x 6   Entered and Authorized by:   Tresa Garter MD   Signed by:   Tresa Garter MD on 04/12/2010   Method used:   Print then Give to Patient   RxID:   [id number] METHADONE HCL 10 MG  TABS (METHADONE HCL) 2 tabs by mouth qid as needed pain Please,  fill on or after 07/08/10  #240 x 0   Entered and Authorized by:   Tresa Garter MD   Signed by:   Tresa Garter MD on 04/12/2010   Method used:   Print then Give to Patient   RxID:   [id number] METHADONE HCL 10 MG  TABS (METHADONE HCL) 2 tabs by mouth qid as needed pain Please,  fill on or after 06/08/10  #240 x 0   Entered and Authorized by:   Tresa Garter MD   Signed by:   Tresa Garter MD on 04/12/2010   Method used:   Print then Give to Patient   RxID:   [id number] METHADONE HCL 10 MG  TABS (METHADONE HCL) 2 tabs by mouth qid as needed pain Please,   fill on or after 05/08/10  #240 x 0   Entered and Authorized by:   Tresa Garter MD   Signed by:   Tresa Garter MD on 04/12/2010   Method used:   Print then Give to Patient   RxID:   [id number]    Immunizations Administered:  Pneumonia Vaccine:    Vaccine Type: Pneumovax    Site: left deltoid    Mfr: Merck    Dose: 0.5 ml    Route: IM    Given by: Lanier Prude, CMA(AAMA)    Exp. Date: 09/30/2011  Lot #: [id number]    VIS given: 06/22/09 version given April 12, 2010.    Contraindications/Deferment of Procedures/Staging:    Test/Procedure: FLU VAX    Reason for deferment: patient declined

## 2010-08-19 NOTE — Assessment & Plan Note (Signed)
Summary: 3 MO ROV /NWS  #   Vital Signs:  Patient profile:   71 year old male Height:      72 inches Weight:      190 pounds BMI:     25.86 Temp:     97.4 degrees F oral Pulse rate:   74 / minute Pulse rhythm:   regular Resp:     16 per minute BP sitting:   142 / 84  (left arm) Cuff size:   regular  Vitals Entered By: Lanier Prude, CMA(AAMA) (July 14, 2010 1:27 PM) CC: 3 mo f/u  Is Patient Diabetic? Yes Comments pt recently lost his youngest brother. He has been having some depression since finding out his brother drowned earlier this month.   Primary Care Provider:  Sula Soda, MD  CC:  3 mo f/u .  History of Present Illness: The patient presents for a follow up of hypertension, diabetes, hyperlipidemia His 17 yo brother died on 2023/07/04   Current Medications (verified): 1)  Methadone Hcl 10 Mg  Tabs (Methadone Hcl) .... 2 Tabs By Mouth Qid As Needed Pain Please,  Fill On or After 07/08/10 2)  Diazepam 5 Mg Tabs (Diazepam) .Marland Kitchen.. 1 By Mouth By Mouth Three Times A Day As Needed Cramps or Insomnia 3)  Glimepiride 4 Mg Tabs (Glimepiride) .Marland Kitchen.. 1 By Mouth Bid 4)  Vitamin-B Complex   Tabs (B Complex Vitamins) .Marland Kitchen.. 1 Qd 5)  Vitamin D3 1000 Unit  Tabs (Cholecalciferol) .Marland Kitchen.. 1 Qd 6)  Onetouch Ultra Test  Strp (Glucose Blood) .Marland Kitchen.. 1 Two Times A Day Dx 250.00 Flucuating Cbgs 7)  Accu-Chek Softclix Lancets  Misc (Lancets) .... Two Times A Day 8)  Zantac 75 75 Mg Tabs (Ranitidine Hcl) .... Once Daily 9)  Triamcinolone Acetonide 0.5 % Crea (Triamcinolone Acetonide) .... Apply Bid To Affected Area 10)  Allegra-D Allergy & Congestion 180-240 Mg Xr24h-Tab (Fexofenadine-Pseudoephedrine) .Marland Kitchen.. 1 By Mouth Once Daily As Needed Allergy 11)  Fluticasone Propionate 50 Mcg/act  Susp (Fluticasone Propionate) .... 2 Sprays Each Nostril Once Daily  Allergies (verified): 1)  ! * Viagria 2)  ! * Crestor 3)  ! * Duragesic 4)  ! Penicillin 5)  Cipro  Past History:  Past Medical  History: Last updated: 08/06/2008 hemochreuato-heterozygote Fatty liver Poss GS vs sludge hypotension Diabetes mellitus, type II Diverticulosis, colon GERD hypogonalism Low back pain Osteoarthritis Peripheral neuropathy Constipation  Social History: Last updated: 08/26/2008 Retired Married Never Smoked Caring for ill mother at home and a sister with brain injury at a NH Alcohol Use - yes  Review of Systems       The patient complains of difficulty walking and depression.  The patient denies chest pain and abdominal pain.         LBP  Physical Exam  General:  Well developed, well nourished, no acute distress VSS, non toxic appearing. Head:  Normocephalic and atraumatic without obvious abnormalities. No apparent alopecia or balding. Eyes:  No corneal or conjunctival inflammation noted. EOMI. Perrla. Funduscopic exam benign, without hemorrhages, exudates or papilledema. Vision grossly normal. Nose:  slight erythema and edema. Sinuses NTTP Mouth:  Oral mucosa and oropharynx without lesions or exudates.  Teeth in good repair. Neck:  No deformities, masses, or tenderness noted. Lungs:  Clear throughout to auscultation. Heart:  Regular rate and rhythm; no murmurs, rubs,  or bruits. Abdomen:  Soft, nontender and nondistended. No masses, hepatosplenomegaly or hernias noted. Normal bowel sounds. Msk:  Lumbar-sacral spine is tender  to palpation over paraspinal muscles and painfull with the ROM  Stiff LS spine Weak LE B Extremities:  No edema Neurologic:  Alert and  oriented x4 Skin:  clear Psych:  Alert and cooperative. Normal mood and affect.   Impression & Recommendations:  Problem # 1:  DIABETES MELLITUS, TYPE II (ICD-250.00) Assessment Unchanged  His updated medication list for this problem includes:    Glimepiride 4 Mg Tabs (Glimepiride) .Marland Kitchen... 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 Phlebot today - s/p  Problem # 3:  PERIPHERAL NEUROPATHY (ICD-356.9) Assessment: Unchanged On the regimen of medicine(s) reflected in the chart    Problem # 4:  FATTY LIVER DISEASE (ICD-571.8) Assessment: Comment Only  Problem # 5:  LOW BACK PAIN (ICD-724.2) Assessment: Unchanged  His updated medication list for this problem includes:    Methadone Hcl 10 Mg Tabs (Methadone hcl) .Marland Kitchen... 2 tabs by mouth qid as needed pain please,  fill on or after 10/07/10  Problem # 6:  GRIEF REACTION (ICD-309.0) Assessment: New Discussed  Complete Medication List: 1)  Methadone Hcl 10 Mg Tabs (Methadone hcl) .... 2 tabs by mouth qid as needed pain please,  fill on or after 10/07/10 2)  Diazepam 5 Mg Tabs (Diazepam) .Marland Kitchen.. 1 by mouth by mouth three times a day as needed cramps or insomnia 3)  Glimepiride 4 Mg Tabs (Glimepiride) .Marland Kitchen.. 1 by mouth bid 4)  Vitamin-b Complex Tabs (B complex vitamins) .Marland Kitchen.. 1 qd 5)  Vitamin D3 1000 Unit Tabs (Cholecalciferol) .Marland Kitchen.. 1 qd 6)  Onetouch Ultra Test Strp (Glucose blood) .Marland Kitchen.. 1 two times a day dx 250.00 flucuating cbgs 7)  Accu-chek Softclix Lancets Misc (Lancets) .... Two times a day 8)  Zantac 75 75 Mg Tabs (Ranitidine hcl) .... Once daily 9)  Triamcinolone Acetonide 0.5 % Crea (Triamcinolone acetonide) .... Apply bid to affected area 10)  Allegra-d Allergy & Congestion 180-240 Mg Xr24h-tab (Fexofenadine-pseudoephedrine) .Marland Kitchen.. 1 by mouth once daily as needed allergy 11)  Fluticasone Propionate 50 Mcg/act Susp (Fluticasone propionate) .... 2 sprays each nostril once daily  Patient Instructions: 1)  Please schedule a follow-up appointment in 3 months. 2)  BMP prior to visit, ICD-9: 3)  Hepatic Panel prior to visit, ICD-9: 4)  CBC w/ Diff prior to visit, ICD-9: 5)  HbgA1C prior to visit, ICD-9:250.00  Prescriptions: METHADONE HCL 10 MG  TABS  (METHADONE HCL) 2 tabs by mouth qid as needed pain Please,  fill on or after 10/07/10  #240 x 0   Entered and Authorized by:   Tresa Garter MD   Signed by:   Tresa Garter MD on 07/14/2010   Method used:   Print then Give to Patient   RxID:   [id number] METHADONE HCL 10 MG  TABS (METHADONE HCL) 2 tabs by mouth qid as needed pain Please,  fill on or after 222/12  #240 x 0   Entered and Authorized by:   Tresa Garter MD   Signed by:   Tresa Garter MD on 07/14/2010   Method used:   Print then Give to Patient   RxID:   [id number] METHADONE HCL 10 MG  TABS (METHADONE HCL) 2 tabs by mouth qid as needed pain Please,  fill on or after 08/08/10  #240 x 0   Entered and Authorized by:  Tresa Garter MD   Signed by:   Tresa Garter MD on 07/14/2010   Method used:   Print then Give to Patient   RxID:   [id number]    Orders Added: 1)  Est. Patient Level IV [10272]

## 2010-08-20 NOTE — Letter (Signed)
Summary: [facility]   Imported By: Sherian Rein 01/20/2010 11:48:33  _____________________________________________________________________  External Attachment:    Type:   Image     Comment:   External Document

## 2010-10-04 LAB — COMPREHENSIVE METABOLIC PANEL
AST: 25 U/L (ref 0–37)
BUN: 8 mg/dL (ref 6–23)
CO2: 29 mEq/L (ref 19–32)
Chloride: 102 mEq/L (ref 96–112)
Creatinine, Ser: 0.81 mg/dL (ref 0.4–1.5)
GFR calc Af Amer: >60 mL/min (ref >60)
GFR calc non Af Amer: >60 mL/min (ref >60)
Total Bilirubin: 1.1 mg/dL (ref 0.3–1.2)

## 2010-10-04 LAB — CBC
HCT: 49.3 % (ref 39.0–52.0)
MCV: 96.8 fL (ref 78.0–100.0)
RBC: 5.09 MIL/uL (ref 4.22–5.81)
WBC: 6.4 10*3/uL (ref 4.0–10.5)

## 2010-10-04 LAB — GLUCOSE, CAPILLARY: Glucose-Capillary: 167 mg/dL — ABNORMAL HIGH (ref 70–99)

## 2010-10-04 LAB — DIFFERENTIAL
Basophils Absolute: 0 10*3/uL (ref 0.0–0.1)
Basophils Relative: 0 % (ref 0–1)
Eosinophils Relative: 1 % (ref 0–5)
Lymphocytes Relative: 29 % (ref 12–46)

## 2010-10-08 ENCOUNTER — Other Ambulatory Visit: Payer: Self-pay | Admitting: *Deleted

## 2010-10-08 DIAGNOSIS — E119 Type 2 diabetes mellitus without complications: Secondary | ICD-10-CM

## 2010-10-14 ENCOUNTER — Other Ambulatory Visit (INDEPENDENT_AMBULATORY_CARE_PROVIDER_SITE_OTHER): Payer: Medicare Other

## 2010-10-14 DIAGNOSIS — E119 Type 2 diabetes mellitus without complications: Secondary | ICD-10-CM

## 2010-10-14 LAB — CBC WITH DIFFERENTIAL/PLATELET
Basophils Absolute: 0 10*3/uL (ref 0.0–0.1)
Basophils Relative: 0.4 % (ref 0.0–3.0)
Eosinophils Absolute: 0.1 10*3/uL (ref 0.0–0.7)
HCT: 46.8 % (ref 39.0–52.0)
Hemoglobin: 16.3 g/dL (ref 13.0–17.0)
Lymphs Abs: 1.9 10*3/uL (ref 0.7–4.0)
MCHC: 34.8 g/dL (ref 30.0–36.0)
MCV: 98 fl (ref 78.0–100.0)
Monocytes Absolute: 0.8 10*3/uL (ref 0.1–1.0)
Neutro Abs: 4.3 10*3/uL (ref 1.4–7.7)
RBC: 4.78 Mil/uL (ref 4.22–5.81)
RDW: 12.7 % (ref 11.5–14.6)

## 2010-10-14 LAB — HEPATIC FUNCTION PANEL
Alkaline Phosphatase: 66 U/L (ref 39–117)
Bilirubin, Direct: 0.1 mg/dL (ref 0.0–0.3)
Total Bilirubin: 0.5 mg/dL (ref 0.3–1.2)
Total Protein: 7.5 g/dL (ref 6.0–8.3)

## 2010-10-14 LAB — BASIC METABOLIC PANEL
CO2: 30 mEq/L (ref 19–32)
Calcium: 9.7 mg/dL (ref 8.4–10.5)
Chloride: 104 mEq/L (ref 96–112)
Creatinine, Ser: 0.8 mg/dL (ref 0.4–1.5)
Sodium: 141 mEq/L (ref 135–145)

## 2010-10-18 ENCOUNTER — Ambulatory Visit (INDEPENDENT_AMBULATORY_CARE_PROVIDER_SITE_OTHER): Payer: Medicare Other | Admitting: Internal Medicine

## 2010-10-18 ENCOUNTER — Encounter: Payer: Self-pay | Admitting: Internal Medicine

## 2010-10-18 DIAGNOSIS — M545 Low back pain, unspecified: Secondary | ICD-10-CM

## 2010-10-18 DIAGNOSIS — K219 Gastro-esophageal reflux disease without esophagitis: Secondary | ICD-10-CM

## 2010-10-18 DIAGNOSIS — E119 Type 2 diabetes mellitus without complications: Secondary | ICD-10-CM

## 2010-10-18 DIAGNOSIS — F4321 Adjustment disorder with depressed mood: Secondary | ICD-10-CM

## 2010-10-18 MED ORDER — METHADONE HCL 10 MG PO TABS: 20.0000 mg | ORAL_TABLET | Freq: Four times a day (QID) | ORAL | Status: DC | PRN

## 2010-10-18 MED ORDER — GLIMEPIRIDE 4 MG PO TABS: 4.0000 mg | ORAL_TABLET | Freq: Two times a day (BID) | ORAL | Status: DC

## 2010-10-18 NOTE — Assessment & Plan Note (Signed)
Cont Rx 

## 2010-10-18 NOTE — Patient Instructions (Signed)
Go to our Lab in 4-6 wks to have a phlebotomy

## 2010-10-18 NOTE — Assessment & Plan Note (Signed)
No changes in A1c - cont Rx Lab Results  Component Value Date   HGBA1C 7.2* 10/14/2010

## 2010-10-18 NOTE — Assessment & Plan Note (Signed)
Chronic - will renew his meds

## 2010-10-18 NOTE — Progress Notes (Signed)
  Subjective:    Patient ID: Gabriel Erickson, male    DOB: [birth year], 70 y.o.   MRN: [id number]  HPI    The patient is here to follow up on chronic depression, anxiety, headaches and chronic moderate LBP and LE pain symptoms controlled with medicines, diet and exercise. He has been grieving.  Review of Systems  Constitutional: Negative for fever and activity change.  HENT: Negative for sneezing and neck pain.   Eyes: Negative for discharge.  Respiratory: Negative for cough.   Cardiovascular: Negative for chest pain.  Gastrointestinal: Negative for abdominal distention.  Genitourinary: Negative for dysuria.  Musculoskeletal: Positive for back pain and gait problem. Negative for joint swelling.  Neurological: Negative for dizziness and weakness.  Hematological: Negative for adenopathy.  Psychiatric/Behavioral: Positive for sleep disturbance and dysphoric mood (grieving). Negative for suicidal ideas.       Objective:   Physical Exam  Constitutional: He is oriented to person, place, and time. He appears well-developed and well-nourished. No distress.  HENT:  Mouth/Throat: No oropharyngeal exudate.  Eyes: Pupils are equal, round, and reactive to light.  Neck: No JVD present.  Cardiovascular: Normal rate.   No murmur heard. Pulmonary/Chest: He exhibits no tenderness.  Abdominal: He exhibits no distension. There is no tenderness.  Musculoskeletal: He exhibits tenderness.       LS is tender with ROM and decreased strength in B LEs  Neurological: He is alert and oriented to person, place, and time. He displays abnormal reflex. No cranial nerve deficit. He exhibits normal muscle tone. Coordination abnormal.  Skin: Skin is warm.  Psychiatric:       Depressed and grieving         Lab Results  Component Value Date   WBC 7.2 10/14/2010   HGB 16.3 10/14/2010   HCT 46.8 10/14/2010   PLT 254.0 10/14/2010   CHOL 208* 04/12/2010   TRIG 151.0* 04/12/2010   HDL 36.10* 04/12/2010   LDLDIRECT  142.3 04/12/2010   ALT 27 10/14/2010   AST 25 10/14/2010   NA 141 10/14/2010   K 4.7 10/14/2010   CL 104 10/14/2010   CREATININE 0.8 10/14/2010   BUN 9 10/14/2010   CO2 30 10/14/2010   TSH 0.56 04/12/2010   PSA 1.19 04/12/2010   HGBA1C 7.2* 10/14/2010    Assessment & Plan:  DIABETES MELLITUS, TYPE II No changes in A1c - cont Rx Lab Results  Component Value Date   HGBA1C 7.2* 10/14/2010     GERD Cont Rx  GRIEF REACTION No better. Discussed in detail. >45 min Hemochromatosis, hereditary RTC 1 mo for phlebotomy We will check testost later  LOW BACK PAIN Chronic - will renew his meds - Methadone  Potential benefits of a long term opioids use as well as potential risks (i.e. addiction risk, apnea etc) and complications (i.e. somnolence, constipation and others) were explained to the patient and were aknowledged.

## 2010-10-18 NOTE — Assessment & Plan Note (Signed)
RTC 1 mo for phlebotomy

## 2010-10-18 NOTE — Assessment & Plan Note (Signed)
No better. Discussed.

## 2011-01-01 ENCOUNTER — Encounter: Payer: Self-pay | Admitting: Family Medicine

## 2011-01-01 ENCOUNTER — Ambulatory Visit (INDEPENDENT_AMBULATORY_CARE_PROVIDER_SITE_OTHER): Payer: Medicare Other | Admitting: Family Medicine

## 2011-01-01 VITALS — BP 138/70 | HR 68 | Temp 97.8°F | Wt 192.1 lb

## 2011-01-01 DIAGNOSIS — H6691 Otitis media, unspecified, right ear: Secondary | ICD-10-CM | POA: Insufficient documentation

## 2011-01-01 DIAGNOSIS — H6690 Otitis media, unspecified, unspecified ear: Secondary | ICD-10-CM

## 2011-01-01 MED ORDER — AZITHROMYCIN 250 MG PO TABS: ORAL_TABLET | ORAL | Status: AC

## 2011-01-01 NOTE — Patient Instructions (Signed)
Drink lots of fluids  Get some rest this weekend  Tylenol for fever if you need it  Take the zpack for ear infection Follow up if worse or not feeling better

## 2011-01-01 NOTE — Assessment & Plan Note (Signed)
With viral uri  tx with zpak  Offered cough med - pt declined Disc sympt care F/u if not imp next week or call if worse

## 2011-01-01 NOTE — Progress Notes (Signed)
Subjective:    Patient ID: Gabriel Erickson, male    DOB: July 30, 1940, 70 y.o.   MRN: [id number]  HPI Here for R ear and coughing spells - worse at night  Started as some general malaise and aches (? May have had a fever and not known it )  He tried using an ear wax kit -- got out wax and a little blood  Hearing is still not great   Some stuffiness - when he blows - gets some blood out of R nostil  Mucous is clear to cloudy  Some cough prod -- clear to little green   Non smoker No asthma   Took homeopathic cough remedy - helped a little- ? Name of it   Patient Active Problem List  Diagnoses  . DIABETES MELLITUS, TYPE II  . Hemochromatosis, hereditary  . PERIPHERAL NEUROPATHY  . HEMORRHOIDS  . OTHER ACUTE SINUSITIS  . TMJ PAIN  . GERD  . DIVERTICULOSIS, COLON  . DIVERTICULITIS  . IBS  . FATTY LIVER DISEASE  . ERECTILE DYSFUNCTION  . OSTEOARTHRITIS  . LOW BACK PAIN  . RASH AND OTHER NONSPECIFIC SKIN ERUPTION  . ABDOMINAL PAIN  . ABNORMAL LIVER FUNCTION TESTS  . GRIEF REACTION  . Right otitis media   Past Medical History  Diagnosis Date  . Fatty liver   . Gallstones     Possible vs sludge  . Hypotension   . Diabetes mellitus   . Diverticulosis of colon   . GERD (gastroesophageal reflux disease)   . Hypogonadism male   . LBP (low back pain)   . Osteoarthritis   . Peripheral neuropathy   . Constipation    Past Surgical History  Procedure Date  . Lumbar laminectomy   . Appendectomy   . Foot surgery 1966    Left  . Tonsillectomy   . Hemorrhoid surgery 2011    [facility]   History  Substance Use Topics  . Smoking status: Never Smoker   . Smokeless tobacco: Not on file  . Alcohol Use: No   Family History  Problem Relation Age of Onset  . Cancer Sister     Liver, biliary duct & tube  . Hemochromatosis Sister   . Heart disease Mother    Allergies  Allergen Reactions  . Ciprofloxacin     REACTION: ? throat discomfort  . Penicillins   . Rosuvastatin      Current Outpatient Prescriptions on File Prior to Visit  Medication Sig Dispense Refill  . ACCU-CHEK SOFTCLIX LANCETS lancets by Other route 2 (two) times daily. Use as instructed       . b complex vitamins tablet Take 1 tablet by mouth daily.        . Cholecalciferol 1000 UNITS tablet Take 1,000 Units by mouth daily.        . diazepam (VALIUM) 5 MG tablet Take 5 mg by mouth 3 (three) times daily as needed. For cramps or insomnia       . fexofenadine-pseudoephedrine (ALLEGRA-D 24) 180-240 MG per 24 hr tablet Take 1 tablet by mouth daily. As needed for allergies       . fluticasone (FLONASE) 50 MCG/ACT nasal spray 2 sprays by Nasal route daily.        Marland Kitchen glucose blood (ONE TOUCH ULTRA TEST) test strip 1 each by Other route 2 (two) times daily. Dx 250.00 fluctuating blood sugar       . methadone (DOLOPHINE) 10 MG tablet Take 2 tablets (20 mg total)  by mouth 4 (four) times daily as needed. For pain - FILL ON OR AFTER 12/07/2010  240 tablet  0  . ranitidine (ZANTAC) 75 MG tablet Take 75 mg by mouth daily.        Marland Kitchen glimepiride (AMARYL) 4 MG tablet Take 1 tablet (4 mg total) by mouth 2 (two) times daily.  60 tablet  11  . triamcinolone (KENALOG) 0.5 % cream Apply topically 2 (two) times daily as needed.             Review of Systems Review of Systems  Constitutional: Negative for fever, appetite change,  and unexpected weight change. pos for fatigue  Eyes: Negative for pain and visual disturbance.  ENT pos for ear pain and st and sinus congestion Respiratory:pos for cough/ neg for sob  Cardiovascular: Negative.for cp or palp   Gastrointestinal: Negative for nausea, diarrhea and constipation.  Genitourinary: Negative for urgency and frequency.  Skin: Negative for pallor. no rash Neurological: Negative for weakness, light-headedness, numbness and headaches.  Hematological: Negative for adenopathy. Does not bruise/bleed easily.  Psychiatric/Behavioral: Negative for dysphoric mood. The patient  is not nervous/anxious.          Objective:   Physical Exam  Constitutional: He appears well-developed and well-nourished. No distress.  HENT:  Head: Normocephalic and atraumatic.  Left Ear: External ear normal.  Mouth/Throat: Oropharynx is clear and moist.       R ear/ TM- erythema with effusion and mild bulging No sinus tenderness Nares are congested and injected     Eyes: Conjunctivae and EOM are normal. Pupils are equal, round, and reactive to light. Right eye exhibits no discharge. Left eye exhibits no discharge.  Neck: Normal range of motion. Neck supple. No thyromegaly present.  Cardiovascular: Normal rate, regular rhythm and normal heart sounds.   Pulmonary/Chest: Effort normal and breath sounds normal. No respiratory distress. He has no wheezes. He has no rales.  Lymphadenopathy:    He has no cervical adenopathy.  Skin: Skin is warm and dry. No rash noted.  Psychiatric: He has a normal mood and affect.          Assessment & Plan:

## 2011-01-13 ENCOUNTER — Telehealth: Payer: Self-pay | Admitting: *Deleted

## 2011-01-13 ENCOUNTER — Other Ambulatory Visit (INDEPENDENT_AMBULATORY_CARE_PROVIDER_SITE_OTHER): Payer: Medicare Other

## 2011-01-13 DIAGNOSIS — E119 Type 2 diabetes mellitus without complications: Secondary | ICD-10-CM

## 2011-01-13 LAB — HEPATIC FUNCTION PANEL
ALT: 26 U/L (ref 0–53)
Albumin: 4.2 g/dL (ref 3.5–5.2)
Bilirubin, Direct: 0.1 mg/dL (ref 0.0–0.3)
Total Protein: 7.1 g/dL (ref 6.0–8.3)

## 2011-01-13 LAB — CBC WITH DIFFERENTIAL/PLATELET
Eosinophils Absolute: 0.1 10*3/uL (ref 0.0–0.7)
Eosinophils Relative: 1.6 % (ref 0.0–5.0)
HCT: 44.2 % (ref 39.0–52.0)
Lymphs Abs: 2.1 10*3/uL (ref 0.7–4.0)
MCHC: 34.8 g/dL (ref 30.0–36.0)
MCV: 97.7 fl (ref 78.0–100.0)
Monocytes Absolute: 0.6 10*3/uL (ref 0.1–1.0)
Platelets: 229 10*3/uL (ref 150.0–400.0)
WBC: 5.7 10*3/uL (ref 4.5–10.5)

## 2011-01-13 LAB — IBC PANEL
Iron: 243 ug/dL — ABNORMAL HIGH (ref 42–165)
Transferrin: 224.6 mg/dL (ref 212.0–360.0)

## 2011-01-13 LAB — HEMOGLOBIN A1C: Hgb A1c MFr Bld: 7.6 % — ABNORMAL HIGH (ref 4.6–6.5)

## 2011-01-13 NOTE — Telephone Encounter (Signed)
Gabriel Erickson takes glimepiride up to twice daily and checks blood sugar twice daily. He must check fasting cbg and second cbg is to be taken in the afternoon - if elevated he is to take the second glimepiride. This regimen has kept his blood sugar stable for some time now.

## 2011-01-25 ENCOUNTER — Ambulatory Visit (INDEPENDENT_AMBULATORY_CARE_PROVIDER_SITE_OTHER): Payer: Medicare Other | Admitting: Internal Medicine

## 2011-01-25 ENCOUNTER — Encounter: Payer: Self-pay | Admitting: Internal Medicine

## 2011-01-25 DIAGNOSIS — M545 Low back pain: Secondary | ICD-10-CM

## 2011-01-25 DIAGNOSIS — H6691 Otitis media, unspecified, right ear: Secondary | ICD-10-CM

## 2011-01-25 DIAGNOSIS — H6690 Otitis media, unspecified, unspecified ear: Secondary | ICD-10-CM

## 2011-01-25 DIAGNOSIS — E119 Type 2 diabetes mellitus without complications: Secondary | ICD-10-CM

## 2011-01-25 DIAGNOSIS — R945 Abnormal results of liver function studies: Secondary | ICD-10-CM

## 2011-01-25 MED ORDER — METHADONE HCL 10 MG PO TABS: 20.0000 mg | ORAL_TABLET | Freq: Four times a day (QID) | ORAL | Status: DC | PRN

## 2011-01-25 MED ORDER — LINAGLIPTIN 5 MG PO TABS: 5.0000 mg | ORAL_TABLET | Freq: Every day | ORAL | Status: DC

## 2011-01-25 NOTE — Assessment & Plan Note (Signed)
Better w/abx

## 2011-01-25 NOTE — Assessment & Plan Note (Signed)
On Methadone x long term

## 2011-01-25 NOTE — Assessment & Plan Note (Signed)
Cont w/Rx 

## 2011-01-25 NOTE — Assessment & Plan Note (Signed)
On Rx 

## 2011-01-25 NOTE — Progress Notes (Signed)
  Subjective:    Patient ID: Gabriel Erickson, male    DOB: 19-Feb-1941, 69 y.o.   MRN: [id number]  HPI  The patient presents for a follow-up of  chronic hypertension, chronic dyslipidemia, hemochromatosis, type 2 diabetes controlled with medicines    Review of Systems  Constitutional: Negative for appetite change, fatigue and unexpected weight change.  HENT: Negative for nosebleeds, congestion, sore throat, sneezing, trouble swallowing and neck pain.   Eyes: Negative for itching and visual disturbance.  Respiratory: Negative for cough.   Cardiovascular: Negative for chest pain, palpitations and leg swelling.  Gastrointestinal: Negative for nausea, diarrhea, blood in stool and abdominal distention.  Genitourinary: Negative for frequency and hematuria.  Musculoskeletal: Positive for back pain, arthralgias and gait problem. Negative for joint swelling.  Skin: Negative for rash.  Neurological: Negative for dizziness, tremors, speech difficulty and weakness.  Psychiatric/Behavioral: Negative for suicidal ideas, sleep disturbance, dysphoric mood and agitation. The patient is not nervous/anxious.        Sad       Objective:   Physical Exam  Constitutional: He is oriented to person, place, and time. He appears well-developed and well-nourished. No distress.  HENT:  Head: Normocephalic and atraumatic.  Right Ear: External ear normal.  Left Ear: External ear normal.  Nose: Nose normal.  Mouth/Throat: Oropharynx is clear and moist. No oropharyngeal exudate.  Eyes: Conjunctivae and EOM are normal. Pupils are equal, round, and reactive to light. Right eye exhibits no discharge. Left eye exhibits no discharge. No scleral icterus.  Neck: Normal range of motion. Neck supple. No JVD present. No tracheal deviation present. No thyromegaly present.  Cardiovascular: Normal rate, regular rhythm, normal heart sounds and intact distal pulses.  Exam reveals no gallop and no friction rub.   No murmur  heard. Pulmonary/Chest: Effort normal and breath sounds normal. No stridor. No respiratory distress. He has no wheezes. He has no rales. He exhibits no tenderness.  Abdominal: Soft. Bowel sounds are normal. He exhibits no distension and no mass. There is no tenderness. There is no rebound and no guarding.  Genitourinary: Rectum normal, prostate normal and penis normal. Guaiac negative stool. No penile tenderness.  Musculoskeletal: Normal range of motion. He exhibits tenderness (LS is tender). He exhibits no edema.  Lymphadenopathy:    He has no cervical adenopathy.  Neurological: He is alert and oriented to person, place, and time. He has normal reflexes. No cranial nerve deficit. He exhibits normal muscle tone. Coordination normal.  Skin: Skin is warm and dry. No rash noted. He is not diaphoretic. No erythema. No pallor.  Psychiatric: His behavior is normal. Judgment and thought content normal.        Lab Results  Component Value Date   WBC 5.7 01/13/2011   HGB 15.4 01/13/2011   HCT 44.2 01/13/2011   PLT 229.0 01/13/2011   CHOL 208* 04/12/2010   TRIG 151.0* 04/12/2010   HDL 36.10* 04/12/2010   LDLDIRECT 142.3 04/12/2010   ALT 26 01/13/2011   AST 26 01/13/2011   NA 141 10/14/2010   K 4.7 10/14/2010   CL 104 10/14/2010   CREATININE 0.8 10/14/2010   BUN 9 10/14/2010   CO2 30 10/14/2010   TSH 0.56 04/12/2010   PSA 1.19 04/12/2010   HGBA1C 7.6* 01/13/2011     Assessment & Plan:

## 2011-02-14 ENCOUNTER — Other Ambulatory Visit (INDEPENDENT_AMBULATORY_CARE_PROVIDER_SITE_OTHER): Payer: Medicare Other

## 2011-02-14 ENCOUNTER — Telehealth: Payer: Self-pay

## 2011-02-14 LAB — CBC WITH DIFFERENTIAL/PLATELET
Eosinophils Absolute: 0.1 10*3/uL (ref 0.0–0.7)
Eosinophils Relative: 1.6 % (ref 0.0–5.0)
Lymphocytes Relative: 36.2 % (ref 12.0–46.0)
MCHC: 33.7 g/dL (ref 30.0–36.0)
MCV: 97.7 fl (ref 78.0–100.0)
Monocytes Absolute: 0.7 10*3/uL (ref 0.1–1.0)
Neutrophils Relative %: 49.6 % (ref 43.0–77.0)
Platelets: 263 10*3/uL (ref 150.0–400.0)
WBC: 5.6 10*3/uL (ref 4.5–10.5)

## 2011-02-14 NOTE — Telephone Encounter (Signed)
Spoke w/Dr Yetta Barre, [place] for therapeutic phlebotomy per

## 2011-02-14 NOTE — Telephone Encounter (Signed)
Patient walked into the lab requesting order for lab.

## 2011-04-27 ENCOUNTER — Other Ambulatory Visit (INDEPENDENT_AMBULATORY_CARE_PROVIDER_SITE_OTHER): Payer: Medicare Other

## 2011-04-27 DIAGNOSIS — E119 Type 2 diabetes mellitus without complications: Secondary | ICD-10-CM

## 2011-04-27 LAB — CBC WITH DIFFERENTIAL/PLATELET
Basophils Absolute: 0 10*3/uL (ref 0.0–0.1)
Basophils Relative: 0.5 % (ref 0.0–3.0)
Eosinophils Absolute: 0.1 10*3/uL (ref 0.0–0.7)
MCHC: 33.9 g/dL (ref 30.0–36.0)
MCV: 97.8 fl (ref 78.0–100.0)
Monocytes Absolute: 0.8 10*3/uL (ref 0.1–1.0)
Neutrophils Relative %: 49.5 % (ref 43.0–77.0)
Platelets: 246 10*3/uL (ref 150.0–400.0)
RDW: 12.4 % (ref 11.5–14.6)

## 2011-04-27 LAB — COMPREHENSIVE METABOLIC PANEL
ALT: 26 U/L (ref 0–53)
AST: 25 U/L (ref 0–37)
Albumin: 4.5 g/dL (ref 3.5–5.2)
Alkaline Phosphatase: 67 U/L (ref 39–117)
Chloride: 104 mEq/L (ref 96–112)
Potassium: 4.1 mEq/L (ref 3.5–5.1)
Sodium: 141 mEq/L (ref 135–145)
Total Protein: 7.8 g/dL (ref 6.0–8.3)

## 2011-04-27 LAB — HEMOGLOBIN A1C: Hgb A1c MFr Bld: 6.7 % — ABNORMAL HIGH (ref 4.6–6.5)

## 2011-05-09 ENCOUNTER — Ambulatory Visit (INDEPENDENT_AMBULATORY_CARE_PROVIDER_SITE_OTHER): Payer: Medicare Other | Admitting: Internal Medicine

## 2011-05-09 ENCOUNTER — Encounter: Payer: Self-pay | Admitting: Internal Medicine

## 2011-05-09 VITALS — BP 140/72 | HR 88 | Temp 98.0°F | Resp 16 | Wt 191.0 lb

## 2011-05-09 DIAGNOSIS — E119 Type 2 diabetes mellitus without complications: Secondary | ICD-10-CM

## 2011-05-09 DIAGNOSIS — K219 Gastro-esophageal reflux disease without esophagitis: Secondary | ICD-10-CM

## 2011-05-09 DIAGNOSIS — R945 Abnormal results of liver function studies: Secondary | ICD-10-CM

## 2011-05-09 DIAGNOSIS — M545 Low back pain: Secondary | ICD-10-CM

## 2011-05-09 MED ORDER — METHADONE HCL 10 MG PO TABS: 20.0000 mg | ORAL_TABLET | Freq: Four times a day (QID) | ORAL | Status: DC | PRN

## 2011-05-09 NOTE — Assessment & Plan Note (Signed)
Chronic - on phlebotomies (had it 1 wk ago)

## 2011-05-09 NOTE — Assessment & Plan Note (Signed)
Improved

## 2011-05-09 NOTE — Assessment & Plan Note (Signed)
Continue with current prescription therapy as reflected on the Med list.  

## 2011-05-09 NOTE — Assessment & Plan Note (Signed)
Continue with current prescription therapy as reflected on the Med list. Better 

## 2011-05-09 NOTE — Progress Notes (Signed)
  Subjective:    Patient ID: Gabriel Erickson, male    DOB: Oct 15, 1940, 70 y.o.   MRN: [id number]  HPI   The patient presents for a follow-up of  Chronic LBP hypertension, chronic dyslipidemia, type 2 diabetes controlled with medicines F/u hemochromatosis  Wt Readings from Last 3 Encounters:  05/09/11 191 lb (86.637 kg)  01/25/11 195 lb (88.451 kg)  01/01/11 192 lb 1.9 oz (87.145 kg)      Review of Systems  Constitutional: Negative for appetite change, fatigue and unexpected weight change.  HENT: Negative for nosebleeds, congestion, sore throat, sneezing, trouble swallowing and neck pain.   Eyes: Negative for itching and visual disturbance.  Respiratory: Negative for cough.   Cardiovascular: Negative for chest pain, palpitations and leg swelling.  Gastrointestinal: Negative for nausea, diarrhea, blood in stool and abdominal distention.  Genitourinary: Negative for frequency and hematuria.  Musculoskeletal: Positive for back pain and gait problem. Negative for joint swelling.  Skin: Negative for rash.  Neurological: Negative for dizziness, tremors, speech difficulty and weakness.  Psychiatric/Behavioral: Negative for sleep disturbance, dysphoric mood and agitation. The patient is not nervous/anxious.        Objective:   Physical Exam  Constitutional: He is oriented to person, place, and time. He appears well-developed.  HENT:  Mouth/Throat: Oropharynx is clear and moist.  Eyes: Conjunctivae are normal. Pupils are equal, round, and reactive to light.  Neck: Normal range of motion. No JVD present. No thyromegaly present.  Cardiovascular: Normal rate, regular rhythm, normal heart sounds and intact distal pulses.  Exam reveals no gallop and no friction rub.   No murmur heard. Pulmonary/Chest: Effort normal and breath sounds normal. No respiratory distress. He has no wheezes. He has no rales. He exhibits no tenderness.  Abdominal: Soft. Bowel sounds are normal. He exhibits no distension  and no mass. There is no tenderness. There is no rebound and no guarding.  Musculoskeletal: Normal range of motion. He exhibits tenderness (LS is tender). He exhibits no edema.  Lymphadenopathy:    He has no cervical adenopathy.  Neurological: He is alert and oriented to person, place, and time. He has normal reflexes. No cranial nerve deficit. He exhibits normal muscle tone. Coordination normal.  Skin: Skin is warm and dry. No rash noted.  Psychiatric: He has a normal mood and affect. His behavior is normal. Judgment and thought content normal.    Lab Results  Component Value Date   WBC 6.9 04/27/2011   HGB 15.9 04/27/2011   HCT 46.7 04/27/2011   PLT 246.0 04/27/2011   GLUCOSE 108* 04/27/2011   CHOL 208* 04/12/2010   TRIG 151.0* 04/12/2010   HDL 36.10* 04/12/2010   LDLDIRECT 142.3 04/12/2010   LDLCALC 129* 06/19/2009   ALT 26 04/27/2011   AST 25 04/27/2011   NA 141 04/27/2011   K 4.1 04/27/2011   CL 104 04/27/2011   CREATININE 0.8 04/27/2011   BUN 10 04/27/2011   CO2 31 04/27/2011   TSH 0.56 04/12/2010   PSA 1.19 04/12/2010   HGBA1C 6.7* 04/27/2011         Assessment & Plan:

## 2011-08-09 ENCOUNTER — Ambulatory Visit: Payer: Medicare Other | Admitting: Internal Medicine

## 2011-08-10 ENCOUNTER — Other Ambulatory Visit (INDEPENDENT_AMBULATORY_CARE_PROVIDER_SITE_OTHER): Payer: Medicare Other

## 2011-08-10 DIAGNOSIS — K219 Gastro-esophageal reflux disease without esophagitis: Secondary | ICD-10-CM

## 2011-08-10 DIAGNOSIS — M545 Low back pain: Secondary | ICD-10-CM

## 2011-08-10 DIAGNOSIS — E119 Type 2 diabetes mellitus without complications: Secondary | ICD-10-CM

## 2011-08-10 DIAGNOSIS — R945 Abnormal results of liver function studies: Secondary | ICD-10-CM

## 2011-08-10 LAB — HEPATIC FUNCTION PANEL
ALT: 29 U/L (ref 0–53)
Bilirubin, Direct: 0.1 mg/dL (ref 0.0–0.3)
Total Bilirubin: 0.6 mg/dL (ref 0.3–1.2)

## 2011-08-10 LAB — CBC WITH DIFFERENTIAL/PLATELET
Basophils Absolute: 0 10*3/uL (ref 0.0–0.1)
Eosinophils Absolute: 0.1 10*3/uL (ref 0.0–0.7)
Hemoglobin: 14.9 g/dL (ref 13.0–17.0)
Lymphocytes Relative: 33.6 % (ref 12.0–46.0)
MCHC: 34.5 g/dL (ref 30.0–36.0)
Neutro Abs: 2.7 10*3/uL (ref 1.4–7.7)
Platelets: 225 10*3/uL (ref 150.0–400.0)
RDW: 12.7 % (ref 11.5–14.6)

## 2011-08-10 LAB — HEMOGLOBIN A1C: Hgb A1c MFr Bld: 6.8 % — ABNORMAL HIGH (ref 4.6–6.5)

## 2011-08-10 LAB — BASIC METABOLIC PANEL
Calcium: 9.3 mg/dL (ref 8.4–10.5)
Chloride: 101 mEq/L (ref 96–112)
Creatinine, Ser: 0.8 mg/dL (ref 0.4–1.5)

## 2011-08-19 ENCOUNTER — Encounter: Payer: Self-pay | Admitting: Internal Medicine

## 2011-08-19 ENCOUNTER — Ambulatory Visit (INDEPENDENT_AMBULATORY_CARE_PROVIDER_SITE_OTHER): Payer: Medicare Other | Admitting: Internal Medicine

## 2011-08-19 DIAGNOSIS — E119 Type 2 diabetes mellitus without complications: Secondary | ICD-10-CM

## 2011-08-19 DIAGNOSIS — G609 Hereditary and idiopathic neuropathy, unspecified: Secondary | ICD-10-CM

## 2011-08-19 DIAGNOSIS — M545 Low back pain: Secondary | ICD-10-CM

## 2011-08-19 DIAGNOSIS — R945 Abnormal results of liver function studies: Secondary | ICD-10-CM

## 2011-08-19 MED ORDER — DIAZEPAM 5 MG PO TABS: 5.0000 mg | ORAL_TABLET | Freq: Three times a day (TID) | ORAL | Status: DC | PRN

## 2011-08-19 MED ORDER — METHADONE HCL 10 MG PO TABS: 20.0000 mg | ORAL_TABLET | Freq: Four times a day (QID) | ORAL | Status: DC | PRN

## 2011-08-19 MED ORDER — GLIMEPIRIDE 4 MG PO TABS: 4.0000 mg | ORAL_TABLET | Freq: Two times a day (BID) | ORAL | Status: DC

## 2011-08-19 NOTE — Assessment & Plan Note (Signed)
Due to radiculopathy and DM continue with current prescription therapy as reflected on the Med list.

## 2011-08-19 NOTE — Progress Notes (Signed)
  Subjective:    Patient ID: Gabriel Erickson, male    DOB: Apr 14, 1941, 71 y.o.   MRN: [id number]  HPI  The patient presents for a follow-up of  Chronic LBP, hypertension, chronic dyslipidemia, type 2 diabetes controlled with medicines    Review of Systems  Constitutional: Negative for appetite change, fatigue and unexpected weight change.  HENT: Negative for nosebleeds, congestion, sore throat, sneezing, trouble swallowing and neck pain.   Eyes: Negative for itching and visual disturbance.  Respiratory: Negative for cough.   Cardiovascular: Negative for chest pain, palpitations and leg swelling.  Gastrointestinal: Negative for nausea, diarrhea, blood in stool and abdominal distention.  Genitourinary: Negative for frequency and hematuria.  Musculoskeletal: Negative for back pain, joint swelling and gait problem.  Skin: Negative for rash.  Neurological: Negative for dizziness, tremors, speech difficulty and weakness.  Psychiatric/Behavioral: Negative for suicidal ideas, sleep disturbance, dysphoric mood and agitation. The patient is not nervous/anxious.        Objective:   Physical Exam  Constitutional: He is oriented to person, place, and time. He appears well-developed. No distress.  HENT:  Mouth/Throat: Oropharynx is clear and moist.  Eyes: Conjunctivae are normal. Pupils are equal, round, and reactive to light.  Neck: Normal range of motion. No JVD present. No thyromegaly present.  Cardiovascular: Normal rate, regular rhythm, normal heart sounds and intact distal pulses.  Exam reveals no gallop and no friction rub.   No murmur heard. Pulmonary/Chest: Effort normal and breath sounds normal. No respiratory distress. He has no wheezes. He has no rales. He exhibits no tenderness.  Abdominal: Soft. Bowel sounds are normal. He exhibits no distension and no mass. There is no tenderness. There is no rebound and no guarding.  Musculoskeletal: Normal range of motion. He exhibits tenderness  (LS). He exhibits no edema.  Lymphadenopathy:    He has no cervical adenopathy.  Neurological: He is alert and oriented to person, place, and time. He displays abnormal reflex. No cranial nerve deficit. He exhibits normal muscle tone. Coordination abnormal.  Skin: Skin is warm and dry. No rash noted.  Psychiatric: He has a normal mood and affect. His behavior is normal. Judgment and thought content normal.          Assessment & Plan:

## 2011-08-19 NOTE — Assessment & Plan Note (Signed)
Continue with current prescription therapy as reflected on the Med list.  

## 2011-08-19 NOTE — Assessment & Plan Note (Signed)
Continue with current phlebotomies.

## 2011-11-16 ENCOUNTER — Ambulatory Visit (INDEPENDENT_AMBULATORY_CARE_PROVIDER_SITE_OTHER): Payer: Medicare Other | Admitting: Internal Medicine

## 2011-11-16 ENCOUNTER — Other Ambulatory Visit (INDEPENDENT_AMBULATORY_CARE_PROVIDER_SITE_OTHER): Payer: Medicare Other

## 2011-11-16 ENCOUNTER — Other Ambulatory Visit: Payer: Self-pay | Admitting: *Deleted

## 2011-11-16 ENCOUNTER — Encounter: Payer: Self-pay | Admitting: Internal Medicine

## 2011-11-16 VITALS — BP 150/80 | HR 100 | Temp 98.2°F | Resp 16 | Wt 189.0 lb

## 2011-11-16 DIAGNOSIS — E119 Type 2 diabetes mellitus without complications: Secondary | ICD-10-CM

## 2011-11-16 DIAGNOSIS — R945 Abnormal results of liver function studies: Secondary | ICD-10-CM

## 2011-11-16 DIAGNOSIS — K219 Gastro-esophageal reflux disease without esophagitis: Secondary | ICD-10-CM

## 2011-11-16 DIAGNOSIS — M545 Low back pain, unspecified: Secondary | ICD-10-CM

## 2011-11-16 DIAGNOSIS — K7689 Other specified diseases of liver: Secondary | ICD-10-CM

## 2011-11-16 DIAGNOSIS — IMO0001 Reserved for inherently not codable concepts without codable children: Secondary | ICD-10-CM

## 2011-11-16 LAB — CBC WITH DIFFERENTIAL/PLATELET
Basophils Relative: 0.5 % (ref 0.0–3.0)
Eosinophils Relative: 1 % (ref 0.0–5.0)
HCT: 47.3 % (ref 39.0–52.0)
Lymphs Abs: 1.7 10*3/uL (ref 0.7–4.0)
MCV: 96.5 fl (ref 78.0–100.0)
Monocytes Absolute: 0.7 10*3/uL (ref 0.1–1.0)
Neutro Abs: 3.3 10*3/uL (ref 1.4–7.7)
RBC: 4.9 Mil/uL (ref 4.22–5.81)
WBC: 5.8 10*3/uL (ref 4.5–10.5)

## 2011-11-16 LAB — HEPATIC FUNCTION PANEL
AST: 23 U/L (ref 0–37)
Alkaline Phosphatase: 70 U/L (ref 39–117)
Bilirubin, Direct: 0.1 mg/dL (ref 0.0–0.3)
Total Protein: 7 g/dL (ref 6.0–8.3)

## 2011-11-16 LAB — BASIC METABOLIC PANEL
CO2: 31 mEq/L (ref 19–32)
Calcium: 9.6 mg/dL (ref 8.4–10.5)
GFR: 92.59 mL/min (ref >60.00)
Glucose, Bld: 152 mg/dL — ABNORMAL HIGH (ref 70–99)
Potassium: 4.3 mEq/L (ref 3.5–5.1)
Sodium: 140 mEq/L (ref 135–145)

## 2011-11-16 MED ORDER — METHADONE HCL 10 MG PO TABS: 20.0000 mg | ORAL_TABLET | Freq: Four times a day (QID) | ORAL | Status: DC | PRN

## 2011-11-16 NOTE — Progress Notes (Signed)
Patient ID: Gabriel Erickson, male   DOB: 28-Mar-1941, 71 y.o.   MRN: [id number]  Subjective:    Patient ID: Gabriel Erickson, male    DOB: May 07, 1941, 71 y.o.   MRN: [id number]  HPI  The patient presents for a follow-up of  Chronic LBP, hypertension, chronic dyslipidemia, type 2 diabetes controlled with medicines  BP Readings from Last 3 Encounters:  11/16/11 150/80  08/19/11 130/72  05/09/11 140/72   Wt Readings from Last 3 Encounters:  11/16/11 189 lb (85.73 kg)  08/19/11 194 lb (87.998 kg)  05/09/11 191 lb (86.637 kg)      Review of Systems  Constitutional: Negative for appetite change, fatigue and unexpected weight change.  HENT: Negative for nosebleeds, congestion, sore throat, sneezing, trouble swallowing and neck pain.   Eyes: Negative for itching and visual disturbance.  Respiratory: Negative for cough.   Cardiovascular: Negative for chest pain, palpitations and leg swelling.  Gastrointestinal: Negative for nausea, diarrhea, blood in stool and abdominal distention.  Genitourinary: Negative for frequency and hematuria.  Musculoskeletal: Negative for back pain, joint swelling and gait problem.  Skin: Negative for rash.  Neurological: Negative for dizziness, tremors, speech difficulty and weakness.  Psychiatric/Behavioral: Negative for suicidal ideas, sleep disturbance, dysphoric mood and agitation. The patient is not nervous/anxious.        Objective:   Physical Exam  Constitutional: He is oriented to person, place, and time. He appears well-developed. No distress.  HENT:  Mouth/Throat: Oropharynx is clear and moist.  Eyes: Conjunctivae are normal. Pupils are equal, round, and reactive to light.  Neck: Normal range of motion. No JVD present. No thyromegaly present.  Cardiovascular: Normal rate, regular rhythm, normal heart sounds and intact distal pulses.  Exam reveals no gallop and no friction rub.   No murmur heard. Pulmonary/Chest: Effort normal and breath sounds normal. No  respiratory distress. He has no wheezes. He has no rales. He exhibits no tenderness.  Abdominal: Soft. Bowel sounds are normal. He exhibits no distension and no mass. There is no tenderness. There is no rebound and no guarding.  Musculoskeletal: Normal range of motion. He exhibits tenderness (LS). He exhibits no edema.  Lymphadenopathy:    He has no cervical adenopathy.  Neurological: He is alert and oriented to person, place, and time. He displays abnormal reflex. No cranial nerve deficit. He exhibits normal muscle tone. Coordination abnormal.  Skin: Skin is warm and dry. No rash noted.  Psychiatric: He has a normal mood and affect. His behavior is normal. Judgment and thought content normal.   Lab Results  Component Value Date   WBC 5.8 11/16/2011   HGB 16.1 11/16/2011   HCT 47.3 11/16/2011   PLT 238.0 11/16/2011   GLUCOSE 152* 11/16/2011   CHOL 208* 04/12/2010   TRIG 151.0* 04/12/2010   HDL 36.10* 04/12/2010   LDLDIRECT 142.3 04/12/2010   LDLCALC 129* 06/19/2009   ALT 23 11/16/2011   AST 23 11/16/2011   NA 140 11/16/2011   K 4.3 11/16/2011   CL 103 11/16/2011   CREATININE 0.9 11/16/2011   BUN 8 11/16/2011   CO2 31 11/16/2011   TSH 0.56 04/12/2010   PSA 1.19 04/12/2010   HGBA1C 6.5 11/16/2011          Assessment & Plan:

## 2011-11-16 NOTE — Assessment & Plan Note (Signed)
Chronic - on phlebotomies 

## 2011-11-16 NOTE — Assessment & Plan Note (Signed)
Chronic and severe   Potential benefits of a long term opioids use as well as potential risks (i.e. addiction risk, apnea etc) and complications (i.e. Somnolence, constipation and others) were explained to the patient and were aknowledged.  Continue with current prescription therapy as reflected on the Med list.  

## 2011-11-16 NOTE — Assessment & Plan Note (Signed)
Watching labs 

## 2011-11-16 NOTE — Assessment & Plan Note (Signed)
Continue with current prescription therapy as reflected on the Med list.  

## 2011-12-31 ENCOUNTER — Encounter: Payer: Self-pay | Admitting: Family Medicine

## 2011-12-31 ENCOUNTER — Ambulatory Visit (INDEPENDENT_AMBULATORY_CARE_PROVIDER_SITE_OTHER): Payer: Medicare Other | Admitting: Family Medicine

## 2011-12-31 VITALS — BP 128/82 | HR 65 | Temp 97.5°F | Wt 189.0 lb

## 2011-12-31 DIAGNOSIS — L237 Allergic contact dermatitis due to plants, except food: Secondary | ICD-10-CM

## 2011-12-31 DIAGNOSIS — L255 Unspecified contact dermatitis due to plants, except food: Secondary | ICD-10-CM

## 2011-12-31 MED ORDER — FLUOCINONIDE 0.05 % EX CREA: TOPICAL_CREAM | Freq: Two times a day (BID) | CUTANEOUS | Status: AC

## 2011-12-31 NOTE — Assessment & Plan Note (Signed)
Use lidex if not improved with current TAC 0.5% cream. F/u prn. Wouldn't use oral steroids due to DM2.  D/w pt.

## 2011-12-31 NOTE — Patient Instructions (Addendum)
Use the lidex (not on your face) if the triamcinolone doesn't help. . Take care.

## 2011-12-31 NOTE — Progress Notes (Signed)
He has likely poison [place] exposure.  Has TAC 0.5% cream to use, it helps some.  He has a new spot on his chest and wanted it evaluated.    Meds, vitals, and allergies reviewed.   ROS: See HPI.  Otherwise, noncontributory.  nad rrr ctab Skin with irregular blanching red maculopapular rash in nondermatomal distribution on L arm and L chest wall.

## 2012-01-04 ENCOUNTER — Telehealth: Payer: Self-pay | Admitting: Internal Medicine

## 2012-01-04 NOTE — Telephone Encounter (Signed)
Caller: Lacinda Axon; PCP: Plotnikov, Alex; CB#: [phone];  Call regarding Poison [place] Exposure -. Cream Not Working; follows OV 12/31/11, states now has new onset of rash that is spreading, to arms, hands, ankles, abdomen, chest, looks like blisters, denies drainage, afebrile. Symptoms reviewed Poison [place] Exposure, pt requesting more RX, prefers Prednisone either injection or PO. Please call.

## 2012-01-05 ENCOUNTER — Telehealth: Payer: Self-pay | Admitting: Internal Medicine

## 2012-01-05 MED ORDER — PREDNISONE (PAK) 10 MG PO TABS: 10.0000 mg | ORAL_TABLET | ORAL | Status: AC

## 2012-01-05 NOTE — Telephone Encounter (Signed)
pred pak x 6d taper - erx done - OV if unimproved

## 2012-01-05 NOTE — Telephone Encounter (Signed)
Caller: Lacinda Axon; PCP: Plotnikov, Alex; CB#: [phone]; 2nd call regarding Rash/Hives;  Spreading, red, itching, blistering rash; suspects poison [place] exposure after removing weeds.  Rash on L arm, hand, abdomen, side of chest, ankles.  Onset: 12/26/11.  Afebrile.  FBS 130.  Seen 12/31/11. Rx Lidex cream not working.  Using Benadryl prn.  Asking for new oral RX to be called in for itching. Continues to have new areas of break-outs. Advised to see MD within 24 hrs for symptoms not improved or worsening after 3 days of home treatment per Poison [place], [place] or [place] Exposure.   Declines appt d/t distance; requests different RX for itching, including Prednisone,  be called to [facility] Haagensen [phone]. Willing to test blood sugar QID.  Informed Dr Posey Rea is out of the office 01/05/12.  Info noted and sent to MD and [facility] for follow up.   OFFICE PLEASE CALL BACK ASAP 01/05/12.

## 2012-01-05 NOTE — Telephone Encounter (Signed)
Error

## 2012-01-06 NOTE — Telephone Encounter (Signed)
Pt informed

## 2012-02-06 ENCOUNTER — Ambulatory Visit (INDEPENDENT_AMBULATORY_CARE_PROVIDER_SITE_OTHER): Payer: Medicare Other | Admitting: Internal Medicine

## 2012-02-06 ENCOUNTER — Other Ambulatory Visit (INDEPENDENT_AMBULATORY_CARE_PROVIDER_SITE_OTHER): Payer: Medicare Other

## 2012-02-06 ENCOUNTER — Encounter: Payer: Self-pay | Admitting: Internal Medicine

## 2012-02-06 VITALS — BP 140/92 | HR 88 | Temp 98.2°F | Resp 16 | Wt 187.0 lb

## 2012-02-06 DIAGNOSIS — E119 Type 2 diabetes mellitus without complications: Secondary | ICD-10-CM

## 2012-02-06 DIAGNOSIS — M545 Low back pain: Secondary | ICD-10-CM

## 2012-02-06 DIAGNOSIS — K7689 Other specified diseases of liver: Secondary | ICD-10-CM

## 2012-02-06 DIAGNOSIS — G609 Hereditary and idiopathic neuropathy, unspecified: Secondary | ICD-10-CM

## 2012-02-06 DIAGNOSIS — K1120 Sialoadenitis, unspecified: Secondary | ICD-10-CM

## 2012-02-06 DIAGNOSIS — B029 Zoster without complications: Secondary | ICD-10-CM | POA: Insufficient documentation

## 2012-02-06 LAB — HEMOGLOBIN A1C: Hgb A1c MFr Bld: 7 % — ABNORMAL HIGH (ref 4.6–6.5)

## 2012-02-06 LAB — HEPATIC FUNCTION PANEL
ALT: 28 U/L (ref 0–53)
AST: 23 U/L (ref 0–37)
Albumin: 4 g/dL (ref 3.5–5.2)
Alkaline Phosphatase: 67 U/L (ref 39–117)
Total Protein: 7.2 g/dL (ref 6.0–8.3)

## 2012-02-06 LAB — BASIC METABOLIC PANEL
Calcium: 9.6 mg/dL (ref 8.4–10.5)
Creatinine, Ser: 0.8 mg/dL (ref 0.4–1.5)
GFR: 96.35 mL/min (ref >60.00)

## 2012-02-06 LAB — CBC WITH DIFFERENTIAL/PLATELET
Basophils Relative: 0.4 % (ref 0.0–3.0)
HCT: 46.1 % (ref 39.0–52.0)
Hemoglobin: 15.8 g/dL (ref 13.0–17.0)
Lymphocytes Relative: 32 % (ref 12.0–46.0)
Lymphs Abs: 1.9 10*3/uL (ref 0.7–4.0)
Monocytes Relative: 14.5 % — ABNORMAL HIGH (ref 3.0–12.0)
Neutro Abs: 3.1 10*3/uL (ref 1.4–7.7)
RBC: 4.74 Mil/uL (ref 4.22–5.81)

## 2012-02-06 MED ORDER — METHYLPREDNISOLONE ACETATE 80 MG/ML IJ SUSP
120.0000 mg | Freq: Once | INTRAMUSCULAR | Status: AC
  Administered 2012-02-06: 120 mg via INTRAMUSCULAR

## 2012-02-06 MED ORDER — METHADONE HCL 10 MG PO TABS: 20.0000 mg | ORAL_TABLET | Freq: Four times a day (QID) | ORAL | Status: DC | PRN

## 2012-02-06 MED ORDER — ACYCLOVIR 800 MG PO TABS: 800.0000 mg | ORAL_TABLET | Freq: Every day | ORAL | Status: AC

## 2012-02-06 NOTE — Progress Notes (Signed)
   Subjective:    Patient ID: Gabriel Erickson, male    DOB: 11/27/1940, 71 y.o.   MRN: [id number]  Rash Pertinent negatives include no congestion, cough, diarrhea, fatigue or sore throat.   C/o L face pain and swelling since Sat, HA L  The patient presents for a follow-up of  Chronic LBP, hypertension, chronic dyslipidemia, type 2 diabetes controlled with medicines  BP Readings from Last 3 Encounters:  02/06/12 140/92  12/31/11 128/82  11/16/11 150/80   Wt Readings from Last 3 Encounters:  02/06/12 187 lb (84.823 kg)  12/31/11 189 lb (85.73 kg)  11/16/11 189 lb (85.73 kg)      Review of Systems  Constitutional: Negative for appetite change, fatigue and unexpected weight change.  HENT: Negative for nosebleeds, congestion, sore throat, sneezing, trouble swallowing and neck pain.   Eyes: Negative for itching and visual disturbance.  Respiratory: Negative for cough.   Cardiovascular: Negative for chest pain, palpitations and leg swelling.  Gastrointestinal: Negative for nausea, diarrhea, blood in stool and abdominal distention.  Genitourinary: Negative for frequency and hematuria.  Musculoskeletal: Negative for back pain, joint swelling and gait problem.  Skin: Positive for rash.  Neurological: Negative for dizziness, tremors, speech difficulty and weakness.  Psychiatric/Behavioral: Negative for suicidal ideas, disturbed wake/sleep cycle, dysphoric mood and agitation. The patient is not nervous/anxious.        Objective:   Physical Exam  Constitutional: He is oriented to person, place, and time. He appears well-developed. No distress.  HENT:  Mouth/Throat: Oropharynx is clear and moist.  Eyes: Conjunctivae are normal. Pupils are equal, round, and reactive to light.  Neck: Normal range of motion. No JVD present. No thyromegaly present.  Cardiovascular: Normal rate, regular rhythm, normal heart sounds and intact distal pulses.  Exam reveals no gallop and no friction rub.   No  murmur heard. Pulmonary/Chest: Effort normal and breath sounds normal. No respiratory distress. He has no wheezes. He has no rales. He exhibits no tenderness.  Abdominal: Soft. Bowel sounds are normal. He exhibits no distension and no mass. There is no tenderness. There is no rebound and no guarding.  Musculoskeletal: Normal range of motion. He exhibits tenderness (LS). He exhibits no edema.  Lymphadenopathy:    He has no cervical adenopathy.  Neurological: He is alert and oriented to person, place, and time. He displays abnormal reflex. No cranial nerve deficit. He exhibits normal muscle tone. Coordination abnormal.  Skin: Skin is warm and dry. No rash noted.  Psychiatric: He has a normal mood and affect. His behavior is normal. Judgment and thought content normal.  L parotid is swollen and tender L forehead w/eryth patch w/blisters   Lab Results  Component Value Date   WBC 6.0 02/06/2012   HGB 15.8 02/06/2012   HCT 46.1 02/06/2012   PLT 230.0 02/06/2012   GLUCOSE 119* 02/06/2012   CHOL 208* 04/12/2010   TRIG 151.0* 04/12/2010   HDL 36.10* 04/12/2010   LDLDIRECT 142.3 04/12/2010   LDLCALC 129* 06/19/2009   ALT 28 02/06/2012   AST 23 02/06/2012   NA 140 02/06/2012   K 4.1 02/06/2012   CL 103 02/06/2012   CREATININE 0.8 02/06/2012   BUN 10 02/06/2012   CO2 30 02/06/2012   TSH 0.56 04/12/2010   PSA 1.19 04/12/2010   HGBA1C 7.0* 02/06/2012          Assessment & Plan:

## 2012-02-07 ENCOUNTER — Encounter: Payer: Self-pay | Admitting: Internal Medicine

## 2012-02-07 NOTE — Assessment & Plan Note (Signed)
Continue with current prescription therapy as reflected on the Med list.  

## 2012-02-07 NOTE — Assessment & Plan Note (Signed)
Start Acyclovir

## 2012-02-07 NOTE — Assessment & Plan Note (Signed)
7/13 L - likely viral, associated w/herpes POC [place] - swelling, no abscess Treat H zoster Depo 120 mg IM

## 2012-02-22 ENCOUNTER — Ambulatory Visit: Payer: Medicare Other | Admitting: Internal Medicine

## 2012-05-15 ENCOUNTER — Other Ambulatory Visit (INDEPENDENT_AMBULATORY_CARE_PROVIDER_SITE_OTHER): Payer: Medicare Other

## 2012-05-15 ENCOUNTER — Ambulatory Visit (INDEPENDENT_AMBULATORY_CARE_PROVIDER_SITE_OTHER): Payer: Medicare Other | Admitting: Internal Medicine

## 2012-05-15 ENCOUNTER — Encounter: Payer: Self-pay | Admitting: Internal Medicine

## 2012-05-15 VITALS — BP 148/80 | HR 84 | Temp 98.3°F | Resp 16 | Wt 187.0 lb

## 2012-05-15 DIAGNOSIS — K219 Gastro-esophageal reflux disease without esophagitis: Secondary | ICD-10-CM

## 2012-05-15 DIAGNOSIS — M545 Low back pain: Secondary | ICD-10-CM

## 2012-05-15 DIAGNOSIS — B029 Zoster without complications: Secondary | ICD-10-CM

## 2012-05-15 DIAGNOSIS — R945 Abnormal results of liver function studies: Secondary | ICD-10-CM

## 2012-05-15 DIAGNOSIS — E119 Type 2 diabetes mellitus without complications: Secondary | ICD-10-CM

## 2012-05-15 DIAGNOSIS — K1120 Sialoadenitis, unspecified: Secondary | ICD-10-CM

## 2012-05-15 LAB — CBC WITH DIFFERENTIAL/PLATELET
Basophils Absolute: 0 10*3/uL (ref 0.0–0.1)
Hemoglobin: 16 g/dL (ref 13.0–17.0)
Lymphocytes Relative: 26.1 % (ref 12.0–46.0)
Monocytes Relative: 10.7 % (ref 3.0–12.0)
Platelets: 252 10*3/uL (ref 150.0–400.0)
RDW: 12.3 % (ref 11.5–14.6)

## 2012-05-15 LAB — BASIC METABOLIC PANEL
CO2: 31 mEq/L (ref 19–32)
Chloride: 102 mEq/L (ref 96–112)
Potassium: 4.3 mEq/L (ref 3.5–5.1)
Sodium: 138 mEq/L (ref 135–145)

## 2012-05-15 LAB — HEPATIC FUNCTION PANEL
ALT: 25 U/L (ref 0–53)
Alkaline Phosphatase: 66 U/L (ref 39–117)
Bilirubin, Direct: 0.1 mg/dL (ref 0.0–0.3)
Total Protein: 7.3 g/dL (ref 6.0–8.3)

## 2012-05-15 LAB — HEMOGLOBIN A1C: Hgb A1c MFr Bld: 6.2 % (ref 4.6–6.5)

## 2012-05-15 MED ORDER — DIAZEPAM 5 MG PO TABS: 5.0000 mg | ORAL_TABLET | Freq: Three times a day (TID) | ORAL | Status: DC | PRN

## 2012-05-15 MED ORDER — METHADONE HCL 10 MG PO TABS: 20.0000 mg | ORAL_TABLET | Freq: Four times a day (QID) | ORAL | Status: DC | PRN

## 2012-05-15 MED ORDER — GLIMEPIRIDE 4 MG PO TABS: 4.0000 mg | ORAL_TABLET | Freq: Two times a day (BID) | ORAL | Status: DC

## 2012-05-15 NOTE — Progress Notes (Signed)
   Subjective:    Patient ID: Gabriel Erickson, male    DOB: July 09, 1941, 71 y.o.   MRN: [id number]  HPI   The patient presents for a follow-up of  Chronic LBP, hypertension, chronic dyslipidemia, type 2 diabetes controlled with medicines  BP Readings from Last 3 Encounters:  05/15/12 148/80  02/06/12 140/92  12/31/11 128/82   Wt Readings from Last 3 Encounters:  05/15/12 187 lb (84.823 kg)  02/06/12 187 lb (84.823 kg)  12/31/11 189 lb (85.73 kg)      Review of Systems  Constitutional: Negative for appetite change and unexpected weight change.  HENT: Negative for nosebleeds, sneezing, trouble swallowing and neck pain.   Eyes: Negative for itching and visual disturbance.  Cardiovascular: Negative for chest pain, palpitations and leg swelling.  Gastrointestinal: Negative for nausea, blood in stool and abdominal distention.  Genitourinary: Negative for frequency and hematuria.  Musculoskeletal: Negative for back pain, joint swelling and gait problem.  Neurological: Negative for dizziness, tremors, speech difficulty and weakness.  Psychiatric/Behavioral: Negative for suicidal ideas, disturbed wake/sleep cycle, dysphoric mood and agitation. The patient is not nervous/anxious.        Objective:   Physical Exam  Constitutional: He is oriented to person, place, and time. He appears well-developed. No distress.  HENT:  Mouth/Throat: Oropharynx is clear and moist.  Eyes: Conjunctivae normal are normal. Pupils are equal, round, and reactive to light.  Neck: Normal range of motion. No JVD present. No thyromegaly present.  Cardiovascular: Normal rate, regular rhythm, normal heart sounds and intact distal pulses.  Exam reveals no gallop and no friction rub.   No murmur heard. Pulmonary/Chest: Effort normal and breath sounds normal. No respiratory distress. He has no wheezes. He has no rales. He exhibits no tenderness.  Abdominal: Soft. Bowel sounds are normal. He exhibits no distension and no  mass. There is no tenderness. There is no rebound and no guarding.  Musculoskeletal: Normal range of motion. He exhibits tenderness (LS). He exhibits no edema.  Lymphadenopathy:    He has no cervical adenopathy.  Neurological: He is alert and oriented to person, place, and time. He displays abnormal reflex. No cranial nerve deficit. He exhibits normal muscle tone. Coordination abnormal.  Skin: Skin is warm and dry. No rash noted.  Psychiatric: He has a normal mood and affect. His behavior is normal. Judgment and thought content normal.     Lab Results  Component Value Date   WBC 5.9 05/15/2012   HGB 16.0 05/15/2012   HCT 47.5 05/15/2012   PLT 252.0 05/15/2012   GLUCOSE 144* 05/15/2012   CHOL 208* 04/12/2010   TRIG 151.0* 04/12/2010   HDL 36.10* 04/12/2010   LDLDIRECT 142.3 04/12/2010   LDLCALC 129* 06/19/2009   ALT 25 05/15/2012   AST 24 05/15/2012   NA 138 05/15/2012   K 4.3 05/15/2012   CL 102 05/15/2012   CREATININE 0.8 05/15/2012   BUN 8 05/15/2012   CO2 31 05/15/2012   TSH 0.56 04/12/2010   PSA 1.19 04/12/2010   HGBA1C 6.2 05/15/2012          Assessment & Plan:

## 2012-05-15 NOTE — Assessment & Plan Note (Signed)
Continue with current prescription therapy as reflected on the Med list.  

## 2012-05-15 NOTE — Assessment & Plan Note (Signed)
Resolved

## 2012-05-15 NOTE — Assessment & Plan Note (Addendum)
Continue with current prescription therapy as reflected on the Med list. PT/ Ortho cons was offered - declined - doing well

## 2012-08-20 ENCOUNTER — Other Ambulatory Visit (INDEPENDENT_AMBULATORY_CARE_PROVIDER_SITE_OTHER): Payer: 59

## 2012-08-20 ENCOUNTER — Encounter: Payer: Self-pay | Admitting: Internal Medicine

## 2012-08-20 ENCOUNTER — Other Ambulatory Visit: Payer: Self-pay | Admitting: *Deleted

## 2012-08-20 ENCOUNTER — Ambulatory Visit (INDEPENDENT_AMBULATORY_CARE_PROVIDER_SITE_OTHER): Payer: 59 | Admitting: Internal Medicine

## 2012-08-20 VITALS — BP 140/72 | HR 72 | Temp 97.7°F | Resp 16 | Wt 185.0 lb

## 2012-08-20 DIAGNOSIS — E119 Type 2 diabetes mellitus without complications: Secondary | ICD-10-CM

## 2012-08-20 DIAGNOSIS — K219 Gastro-esophageal reflux disease without esophagitis: Secondary | ICD-10-CM

## 2012-08-20 DIAGNOSIS — M545 Low back pain: Secondary | ICD-10-CM

## 2012-08-20 DIAGNOSIS — G609 Hereditary and idiopathic neuropathy, unspecified: Secondary | ICD-10-CM

## 2012-08-20 DIAGNOSIS — K7689 Other specified diseases of liver: Secondary | ICD-10-CM

## 2012-08-20 LAB — CBC WITH DIFFERENTIAL/PLATELET
Basophils Absolute: 0 10*3/uL (ref 0.0–0.1)
Eosinophils Relative: 1.2 % (ref 0.0–5.0)
HCT: 44.3 % (ref 39.0–52.0)
Lymphocytes Relative: 30.1 % (ref 12.0–46.0)
Monocytes Relative: 12.2 % — ABNORMAL HIGH (ref 3.0–12.0)
Neutrophils Relative %: 56.1 % (ref 43.0–77.0)
Platelets: 227 10*3/uL (ref 150.0–400.0)
RDW: 12.4 % (ref 11.5–14.6)
WBC: 5.7 10*3/uL (ref 4.5–10.5)

## 2012-08-20 LAB — HEPATIC FUNCTION PANEL
ALT: 31 U/L (ref 0–53)
AST: 29 U/L (ref 0–37)
Alkaline Phosphatase: 70 U/L (ref 39–117)
Bilirubin, Direct: 0.1 mg/dL (ref 0.0–0.3)
Total Bilirubin: 0.7 mg/dL (ref 0.3–1.2)

## 2012-08-20 LAB — BASIC METABOLIC PANEL
BUN: 9 mg/dL (ref 6–23)
CO2: 30 mEq/L (ref 19–32)
Calcium: 9.4 mg/dL (ref 8.4–10.5)
GFR: 107.98 mL/min (ref >60.00)
Glucose, Bld: 137 mg/dL — ABNORMAL HIGH (ref 70–99)

## 2012-08-20 MED ORDER — METHADONE HCL 10 MG PO TABS: 20.0000 mg | ORAL_TABLET | Freq: Four times a day (QID) | ORAL | Status: DC | PRN

## 2012-08-20 MED ORDER — GABAPENTIN 100 MG PO CAPS: 100.0000 mg | ORAL_CAPSULE | Freq: Three times a day (TID) | ORAL | Status: DC | PRN

## 2012-08-20 NOTE — Assessment & Plan Note (Signed)
Chronic - on phlebotomies

## 2012-08-20 NOTE — Assessment & Plan Note (Signed)
Added Gabapentin

## 2012-08-20 NOTE — Assessment & Plan Note (Signed)
Continue with current prescription therapy as reflected on the Med list. Labs  

## 2012-08-20 NOTE — Assessment & Plan Note (Signed)
Continue with current prescription therapy as reflected on the Med list. 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

## 2012-08-20 NOTE — Assessment & Plan Note (Signed)
Continue with current prescription therapy as reflected on the Med list.  

## 2012-08-20 NOTE — Progress Notes (Signed)
  Subjective:    HPI   The patient presents for a follow-up of  Chronic LBP, hypertension, chronic dyslipidemia, type 2 diabetes controlled with medicines   BP Readings from Last 3 Encounters:  08/20/12 140/72  05/15/12 148/80  02/06/12 140/92   Wt Readings from Last 3 Encounters:  08/20/12 185 lb (83.915 kg)  05/15/12 187 lb (84.823 kg)  02/06/12 187 lb (84.823 kg)      Review of Systems  Constitutional: Negative for appetite change and unexpected weight change.  HENT: Negative for nosebleeds, sneezing, trouble swallowing and neck pain.   Eyes: Negative for itching and visual disturbance.  Cardiovascular: Negative for chest pain, palpitations and leg swelling.  Gastrointestinal: Negative for nausea, blood in stool and abdominal distention.  Genitourinary: Negative for frequency and hematuria.  Musculoskeletal: Negative for back pain, joint swelling and gait problem.  Neurological: Negative for dizziness, tremors, speech difficulty and weakness.  Psychiatric/Behavioral: Negative for suicidal ideas, sleep disturbance, dysphoric mood and agitation. The patient is not nervous/anxious.        Objective:   Physical Exam  Constitutional: He is oriented to person, place, and time. He appears well-developed. No distress.  HENT:  Mouth/Throat: Oropharynx is clear and moist.  Eyes: Conjunctivae normal are normal. Pupils are equal, round, and reactive to light.  Neck: Normal range of motion. No JVD present. No thyromegaly present.  Cardiovascular: Normal rate, regular rhythm, normal heart sounds and intact distal pulses.  Exam reveals no gallop and no friction rub.   No murmur heard. Pulmonary/Chest: Effort normal and breath sounds normal. No respiratory distress. He has no wheezes. He has no rales. He exhibits no tenderness.  Abdominal: Soft. Bowel sounds are normal. He exhibits no distension and no mass. There is no tenderness. There is no rebound and no guarding.   Musculoskeletal: Normal range of motion. He exhibits tenderness (LS). He exhibits no edema.  Lymphadenopathy:    He has no cervical adenopathy.  Neurological: He is alert and oriented to person, place, and time. He displays abnormal reflex. No cranial nerve deficit. He exhibits normal muscle tone. Coordination abnormal.  Skin: Skin is warm and dry. No rash noted.  Psychiatric: He has a normal mood and affect. His behavior is normal. Judgment and thought content normal.     Lab Results  Component Value Date   WBC 5.9 05/15/2012   HGB 16.0 05/15/2012   HCT 47.5 05/15/2012   PLT 252.0 05/15/2012   GLUCOSE 144* 05/15/2012   CHOL 208* 04/12/2010   TRIG 151.0* 04/12/2010   HDL 36.10* 04/12/2010   LDLDIRECT 142.3 04/12/2010   LDLCALC 129* 06/19/2009   ALT 25 05/15/2012   AST 24 05/15/2012   NA 138 05/15/2012   K 4.3 05/15/2012   CL 102 05/15/2012   CREATININE 0.8 05/15/2012   BUN 8 05/15/2012   CO2 31 05/15/2012   TSH 0.56 04/12/2010   PSA 1.19 04/12/2010   HGBA1C 6.2 05/15/2012          Assessment & Plan:

## 2012-11-19 ENCOUNTER — Ambulatory Visit: Payer: 59 | Admitting: Internal Medicine

## 2012-11-20 ENCOUNTER — Other Ambulatory Visit (INDEPENDENT_AMBULATORY_CARE_PROVIDER_SITE_OTHER): Payer: 59

## 2012-11-20 ENCOUNTER — Encounter: Payer: Self-pay | Admitting: Internal Medicine

## 2012-11-20 ENCOUNTER — Other Ambulatory Visit: Payer: Self-pay | Admitting: Internal Medicine

## 2012-11-20 ENCOUNTER — Ambulatory Visit (INDEPENDENT_AMBULATORY_CARE_PROVIDER_SITE_OTHER): Payer: 59 | Admitting: Internal Medicine

## 2012-11-20 DIAGNOSIS — E119 Type 2 diabetes mellitus without complications: Secondary | ICD-10-CM

## 2012-11-20 DIAGNOSIS — G609 Hereditary and idiopathic neuropathy, unspecified: Secondary | ICD-10-CM

## 2012-11-20 DIAGNOSIS — K219 Gastro-esophageal reflux disease without esophagitis: Secondary | ICD-10-CM

## 2012-11-20 DIAGNOSIS — M545 Low back pain, unspecified: Secondary | ICD-10-CM

## 2012-11-20 LAB — HEPATIC FUNCTION PANEL
ALT: 23 U/L (ref 0–53)
Total Bilirubin: 0.7 mg/dL (ref 0.3–1.2)

## 2012-11-20 LAB — BASIC METABOLIC PANEL
GFR: 97.47 mL/min (ref >60.00)
Glucose, Bld: 104 mg/dL — ABNORMAL HIGH (ref 70–99)
Potassium: 4.4 mEq/L (ref 3.5–5.1)
Sodium: 138 mEq/L (ref 135–145)

## 2012-11-20 LAB — HEMOGLOBIN A1C: Hgb A1c MFr Bld: 6.3 % (ref 4.6–6.5)

## 2012-11-20 LAB — CBC
Platelets: 235 10*3/uL (ref 150.0–400.0)
RBC: 4.89 Mil/uL (ref 4.22–5.81)

## 2012-11-20 MED ORDER — METHADONE HCL 10 MG PO TABS: 20.0000 mg | ORAL_TABLET | Freq: Four times a day (QID) | ORAL | Status: DC | PRN

## 2012-11-20 MED ORDER — DIAZEPAM 5 MG PO TABS: 5.0000 mg | ORAL_TABLET | Freq: Three times a day (TID) | ORAL | Status: DC | PRN

## 2012-11-20 NOTE — Progress Notes (Signed)
Patient ID: Gabriel Erickson, male   DOB: 06-18-1941, 72 y.o.   MRN: [id number]  Subjective:    HPI   The patient presents for a follow-up of  Chronic LBP, hypertension, chronic dyslipidemia, type 2 diabetes controlled with medicines   BP Readings from Last 3 Encounters:  11/20/12 130/80  08/20/12 140/72  05/15/12 148/80   Wt Readings from Last 3 Encounters:  11/20/12 184 lb (83.462 kg)  08/20/12 185 lb (83.915 kg)  05/15/12 187 lb (84.823 kg)      Review of Systems  Constitutional: Negative for appetite change and unexpected weight change.  HENT: Negative for nosebleeds, sneezing, trouble swallowing and neck pain.   Eyes: Negative for itching and visual disturbance.  Cardiovascular: Negative for chest pain, palpitations and leg swelling.  Gastrointestinal: Negative for nausea, blood in stool and abdominal distention.  Genitourinary: Negative for frequency and hematuria.  Musculoskeletal: Negative for back pain, joint swelling and gait problem.  Neurological: Negative for dizziness, tremors, speech difficulty and weakness.  Psychiatric/Behavioral: Negative for suicidal ideas, sleep disturbance, dysphoric mood and agitation. The patient is not nervous/anxious.        Objective:   Physical Exam  Constitutional: He is oriented to person, place, and time. He appears well-developed. No distress.  HENT:  Mouth/Throat: Oropharynx is clear and moist.  Eyes: Conjunctivae are normal. Pupils are equal, round, and reactive to light.  Neck: Normal range of motion. No JVD present. No thyromegaly present.  Cardiovascular: Normal rate, regular rhythm, normal heart sounds and intact distal pulses.  Exam reveals no gallop and no friction rub.   No murmur heard. Pulmonary/Chest: Effort normal and breath sounds normal. No respiratory distress. He has no wheezes. He has no rales. He exhibits no tenderness.  Abdominal: Soft. Bowel sounds are normal. He exhibits no distension and no mass. There is  no tenderness. There is no rebound and no guarding.  Musculoskeletal: Normal range of motion. He exhibits tenderness (LS). He exhibits no edema.  Lymphadenopathy:    He has no cervical adenopathy.  Neurological: He is alert and oriented to person, place, and time. He displays abnormal reflex. No cranial nerve deficit. He exhibits normal muscle tone. Coordination abnormal.  Skin: Skin is warm and dry. No rash noted.  Psychiatric: He has a normal mood and affect. His behavior is normal. Judgment and thought content normal.     Lab Results  Component Value Date   WBC 8.1 11/20/2012   HGB 16.3 11/20/2012   HCT 46.0 11/20/2012   PLT 235.0 11/20/2012   GLUCOSE 104* 11/20/2012   CHOL 208* 04/12/2010   TRIG 151.0* 04/12/2010   HDL 36.10* 04/12/2010   LDLDIRECT 142.3 04/12/2010   LDLCALC 129* 06/19/2009   ALT 23 11/20/2012   AST 24 11/20/2012   NA 138 11/20/2012   K 4.4 11/20/2012   CL 103 11/20/2012   CREATININE 0.8 11/20/2012   BUN 8 11/20/2012   CO2 31 11/20/2012   TSH 0.56 04/12/2010   PSA 1.19 04/12/2010   HGBA1C 6.3 11/20/2012          Assessment & Plan:

## 2012-11-20 NOTE — Assessment & Plan Note (Signed)
Chronic - on phlebotomies

## 2012-11-20 NOTE — Assessment & Plan Note (Signed)
Continue with current prescription therapy as reflected on the Med list. Labs q 3-4 mo

## 2013-02-20 ENCOUNTER — Ambulatory Visit (INDEPENDENT_AMBULATORY_CARE_PROVIDER_SITE_OTHER): Payer: 59 | Admitting: Internal Medicine

## 2013-02-20 ENCOUNTER — Other Ambulatory Visit (INDEPENDENT_AMBULATORY_CARE_PROVIDER_SITE_OTHER): Payer: 59

## 2013-02-20 ENCOUNTER — Encounter: Payer: Self-pay | Admitting: Internal Medicine

## 2013-02-20 VITALS — BP 140/68 | HR 72 | Temp 98.3°F | Resp 16 | Wt 186.0 lb

## 2013-02-20 DIAGNOSIS — K219 Gastro-esophageal reflux disease without esophagitis: Secondary | ICD-10-CM

## 2013-02-20 DIAGNOSIS — E119 Type 2 diabetes mellitus without complications: Secondary | ICD-10-CM

## 2013-02-20 DIAGNOSIS — M545 Low back pain: Secondary | ICD-10-CM

## 2013-02-20 DIAGNOSIS — G609 Hereditary and idiopathic neuropathy, unspecified: Secondary | ICD-10-CM

## 2013-02-20 LAB — BASIC METABOLIC PANEL
BUN: 9 mg/dL (ref 6–23)
Chloride: 104 mEq/L (ref 96–112)
Glucose, Bld: 103 mg/dL — ABNORMAL HIGH (ref 70–99)
Potassium: 4.4 mEq/L (ref 3.5–5.1)

## 2013-02-20 LAB — CBC
Hemoglobin: 15.4 g/dL (ref 13.0–17.0)
MCHC: 33.7 g/dL (ref 30.0–36.0)
MCV: 96.4 fl (ref 78.0–100.0)
Platelets: 224 10*3/uL (ref 150.0–400.0)

## 2013-02-20 LAB — HEPATIC FUNCTION PANEL
ALT: 23 U/L (ref 0–53)
Albumin: 4.1 g/dL (ref 3.5–5.2)
Total Protein: 7.3 g/dL (ref 6.0–8.3)

## 2013-02-20 MED ORDER — METHADONE HCL 10 MG PO TABS: 20.0000 mg | ORAL_TABLET | Freq: Four times a day (QID) | ORAL | Status: DC | PRN

## 2013-02-20 MED ORDER — GLIMEPIRIDE 4 MG PO TABS: 4.0000 mg | ORAL_TABLET | Freq: Two times a day (BID) | ORAL | Status: DC

## 2013-02-20 MED ORDER — FLUTICASONE PROPIONATE 50 MCG/ACT NA SUSP: 2.0000 | Freq: Every day | NASAL | Status: DC

## 2013-02-20 MED ORDER — DIAZEPAM 5 MG PO TABS: 5.0000 mg | ORAL_TABLET | Freq: Two times a day (BID) | ORAL | Status: DC | PRN

## 2013-02-20 NOTE — Assessment & Plan Note (Signed)
Continue with current prescription therapy as reflected on the Med list.  

## 2013-02-20 NOTE — Progress Notes (Signed)
   Subjective:    HPI   The patient presents for a follow-up of  Chronic LBP, hypertension, hemochromatosis, chronic dyslipidemia, type 2 diabetes controlled with medicines   BP Readings from Last 3 Encounters:  02/20/13 140/68  11/20/12 130/80  08/20/12 140/72   Wt Readings from Last 3 Encounters:  02/20/13 186 lb (84.369 kg)  11/20/12 184 lb (83.462 kg)  08/20/12 185 lb (83.915 kg)      Review of Systems  Constitutional: Negative for appetite change and unexpected weight change.  HENT: Negative for nosebleeds, sneezing, trouble swallowing and neck pain.   Eyes: Negative for itching and visual disturbance.  Cardiovascular: Negative for chest pain, palpitations and leg swelling.  Gastrointestinal: Negative for nausea, blood in stool and abdominal distention.  Genitourinary: Negative for frequency and hematuria.  Musculoskeletal: Negative for back pain, joint swelling and gait problem.  Neurological: Negative for dizziness, tremors, speech difficulty and weakness.  Psychiatric/Behavioral: Negative for suicidal ideas, sleep disturbance, dysphoric mood and agitation. The patient is not nervous/anxious.        Objective:   Physical Exam  Constitutional: He is oriented to person, place, and time. He appears well-developed. No distress.  HENT:  Mouth/Throat: Oropharynx is clear and moist.  Eyes: Conjunctivae are normal. Pupils are equal, round, and reactive to light.  Neck: Normal range of motion. No JVD present. No thyromegaly present.  Cardiovascular: Normal rate, regular rhythm, normal heart sounds and intact distal pulses.  Exam reveals no gallop and no friction rub.   No murmur heard. Pulmonary/Chest: Effort normal and breath sounds normal. No respiratory distress. He has no wheezes. He has no rales. He exhibits no tenderness.  Abdominal: Soft. Bowel sounds are normal. He exhibits no distension and no mass. There is no tenderness. There is no rebound and no guarding.   Musculoskeletal: Normal range of motion. He exhibits tenderness (LS). He exhibits no edema.  Lymphadenopathy:    He has no cervical adenopathy.  Neurological: He is alert and oriented to person, place, and time. He displays abnormal reflex. No cranial nerve deficit. He exhibits normal muscle tone. Coordination abnormal.  Skin: Skin is warm and dry. No rash noted.  Psychiatric: He has a normal mood and affect. His behavior is normal. Judgment and thought content normal.     Lab Results  Component Value Date   WBC 5.6 02/20/2013   HGB 15.4 02/20/2013   HCT 45.7 02/20/2013   PLT 224.0 02/20/2013   GLUCOSE 103* 02/20/2013   CHOL 208* 04/12/2010   TRIG 151.0* 04/12/2010   HDL 36.10* 04/12/2010   LDLDIRECT 142.3 04/12/2010   LDLCALC 129* 06/19/2009   ALT 23 02/20/2013   AST 23 02/20/2013   NA 140 02/20/2013   K 4.4 02/20/2013   CL 104 02/20/2013   CREATININE 0.8 02/20/2013   BUN 9 02/20/2013   CO2 31 02/20/2013   TSH 0.56 04/12/2010   PSA 1.19 04/12/2010   HGBA1C 6.6* 02/20/2013          Assessment & Plan:

## 2013-02-20 NOTE — Assessment & Plan Note (Signed)
Chronic - on phlebotomies

## 2013-05-24 ENCOUNTER — Encounter: Payer: Self-pay | Admitting: Internal Medicine

## 2013-05-24 ENCOUNTER — Ambulatory Visit (INDEPENDENT_AMBULATORY_CARE_PROVIDER_SITE_OTHER): Payer: 59 | Admitting: Internal Medicine

## 2013-05-24 ENCOUNTER — Other Ambulatory Visit (INDEPENDENT_AMBULATORY_CARE_PROVIDER_SITE_OTHER): Payer: 59

## 2013-05-24 VITALS — BP 130/84 | HR 72 | Temp 97.5°F | Resp 16 | Wt 182.0 lb

## 2013-05-24 DIAGNOSIS — K219 Gastro-esophageal reflux disease without esophagitis: Secondary | ICD-10-CM

## 2013-05-24 DIAGNOSIS — M545 Low back pain, unspecified: Secondary | ICD-10-CM

## 2013-05-24 DIAGNOSIS — E119 Type 2 diabetes mellitus without complications: Secondary | ICD-10-CM

## 2013-05-24 DIAGNOSIS — G609 Hereditary and idiopathic neuropathy, unspecified: Secondary | ICD-10-CM

## 2013-05-24 LAB — CBC
HCT: 45 % (ref 39.0–52.0)
Hemoglobin: 15.6 g/dL (ref 13.0–17.0)
MCHC: 34.7 g/dL (ref 30.0–36.0)
MCV: 95.2 fl (ref 78.0–100.0)
Platelets: 236 10*3/uL (ref 150.0–400.0)
RBC: 4.72 Mil/uL (ref 4.22–5.81)
RDW: 12.7 % (ref 11.5–14.6)
WBC: 6.1 10*3/uL (ref 4.5–10.5)

## 2013-05-24 LAB — HEPATIC FUNCTION PANEL
Albumin: 3.8 g/dL (ref 3.5–5.2)
Bilirubin, Direct: 0.1 mg/dL (ref 0.0–0.3)
Total Protein: 7.1 g/dL (ref 6.0–8.3)

## 2013-05-24 LAB — BASIC METABOLIC PANEL
Chloride: 101 mEq/L (ref 96–112)
GFR: 112.86 mL/min (ref >60.00)
Potassium: 4.1 mEq/L (ref 3.5–5.1)
Sodium: 138 mEq/L (ref 135–145)

## 2013-05-24 LAB — HEMOGLOBIN A1C: Hgb A1c MFr Bld: 6.4 % (ref 4.6–6.5)

## 2013-05-24 MED ORDER — METHADONE HCL 10 MG PO TABS: 20.0000 mg | ORAL_TABLET | Freq: Four times a day (QID) | ORAL | Status: DC | PRN

## 2013-05-24 NOTE — Assessment & Plan Note (Signed)
Continue with current prescription therapy as reflected on the Med list.  

## 2013-05-24 NOTE — Assessment & Plan Note (Signed)
Continue with current prescription therapy as reflected on the Med list. Off [place] [facility]

## 2013-05-24 NOTE — Progress Notes (Signed)
   Subjective:    HPI   The patient presents for a follow-up of  Chronic LBP, hypertension, hemochromatosis, chronic dyslipidemia, type 2 diabetes controlled with medicines   BP Readings from Last 3 Encounters:  05/24/13 130/84  02/20/13 140/68  11/20/12 130/80   Wt Readings from Last 3 Encounters:  05/24/13 182 lb (82.555 kg)  02/20/13 186 lb (84.369 kg)  11/20/12 184 lb (83.462 kg)      Review of Systems  Constitutional: Negative for appetite change and unexpected weight change.  HENT: Negative for nosebleeds, sneezing and trouble swallowing.   Eyes: Negative for itching and visual disturbance.  Cardiovascular: Negative for chest pain, palpitations and leg swelling.  Gastrointestinal: Negative for nausea, blood in stool and abdominal distention.  Genitourinary: Negative for frequency and hematuria.  Musculoskeletal: Negative for back pain, gait problem, joint swelling and neck pain.  Neurological: Negative for dizziness, tremors, speech difficulty and weakness.  Psychiatric/Behavioral: Negative for suicidal ideas, sleep disturbance, dysphoric mood and agitation. The patient is not nervous/anxious.        Objective:   Physical Exam  Constitutional: He is oriented to person, place, and time. He appears well-developed. No distress.  HENT:  Mouth/Throat: Oropharynx is clear and moist.  Eyes: Conjunctivae are normal. Pupils are equal, round, and reactive to light.  Neck: Normal range of motion. No JVD present. No thyromegaly present.  Cardiovascular: Normal rate, regular rhythm, normal heart sounds and intact distal pulses.  Exam reveals no gallop and no friction rub.   No murmur heard. Pulmonary/Chest: Effort normal and breath sounds normal. No respiratory distress. He has no wheezes. He has no rales. He exhibits no tenderness.  Abdominal: Soft. Bowel sounds are normal. He exhibits no distension and no mass. There is no tenderness. There is no rebound and no guarding.   Musculoskeletal: Normal range of motion. He exhibits tenderness (LS). He exhibits no edema.  Lymphadenopathy:    He has no cervical adenopathy.  Neurological: He is alert and oriented to person, place, and time. He displays abnormal reflex. No cranial nerve deficit. He exhibits normal muscle tone. Coordination abnormal.  Skin: Skin is warm and dry. No rash noted.  Psychiatric: He has a normal mood and affect. His behavior is normal. Judgment and thought content normal.     Lab Results  Component Value Date   WBC 6.1 05/24/2013   HGB 15.6 05/24/2013   HCT 45.0 05/24/2013   PLT 236.0 05/24/2013   GLUCOSE 101* 05/24/2013   CHOL 208* 04/12/2010   TRIG 151.0* 04/12/2010   HDL 36.10* 04/12/2010   LDLDIRECT 142.3 04/12/2010   LDLCALC 129* 06/19/2009   ALT 24 05/24/2013   AST 24 05/24/2013   NA 138 05/24/2013   K 4.1 05/24/2013   CL 101 05/24/2013   CREATININE 0.7 05/24/2013   BUN 7 05/24/2013   CO2 33* 05/24/2013   TSH 0.56 04/12/2010   PSA 1.19 04/12/2010   HGBA1C 6.4 05/24/2013          Assessment & Plan:

## 2013-05-24 NOTE — Progress Notes (Signed)
Pre visit review using our clinic review tool, if applicable. No additional management support is needed unless otherwise documented below in the visit note. 

## 2013-08-24 ENCOUNTER — Other Ambulatory Visit: Payer: Self-pay | Admitting: Internal Medicine

## 2013-08-26 ENCOUNTER — Encounter: Payer: Self-pay | Admitting: Internal Medicine

## 2013-08-26 ENCOUNTER — Ambulatory Visit (INDEPENDENT_AMBULATORY_CARE_PROVIDER_SITE_OTHER): Payer: 59 | Admitting: Internal Medicine

## 2013-08-26 ENCOUNTER — Other Ambulatory Visit (INDEPENDENT_AMBULATORY_CARE_PROVIDER_SITE_OTHER): Payer: 59

## 2013-08-26 VITALS — BP 140/78 | HR 72 | Temp 97.4°F | Resp 16 | Wt 186.0 lb

## 2013-08-26 DIAGNOSIS — M545 Low back pain, unspecified: Secondary | ICD-10-CM

## 2013-08-26 DIAGNOSIS — E119 Type 2 diabetes mellitus without complications: Secondary | ICD-10-CM

## 2013-08-26 DIAGNOSIS — G609 Hereditary and idiopathic neuropathy, unspecified: Secondary | ICD-10-CM

## 2013-08-26 DIAGNOSIS — M1990 Unspecified osteoarthritis, unspecified site: Secondary | ICD-10-CM

## 2013-08-26 LAB — BASIC METABOLIC PANEL
BUN: 9 mg/dL (ref 6–23)
CALCIUM: 9.7 mg/dL (ref 8.4–10.5)
CHLORIDE: 106 meq/L (ref 96–112)
CO2: 28 mEq/L (ref 19–32)
Creatinine, Ser: 0.8 mg/dL (ref 0.4–1.5)
GFR: 100.03 mL/min (ref >60.00)
Glucose, Bld: 192 mg/dL — ABNORMAL HIGH (ref 70–99)
POTASSIUM: 4.1 meq/L (ref 3.5–5.1)
SODIUM: 142 meq/L (ref 135–145)

## 2013-08-26 LAB — HEPATIC FUNCTION PANEL
ALBUMIN: 4.1 g/dL (ref 3.5–5.2)
ALT: 25 U/L (ref 0–53)
AST: 23 U/L (ref 0–37)
Alkaline Phosphatase: 59 U/L (ref 39–117)
BILIRUBIN DIRECT: 0 mg/dL (ref 0.0–0.3)
TOTAL PROTEIN: 7.6 g/dL (ref 6.0–8.3)
Total Bilirubin: 0.5 mg/dL (ref 0.3–1.2)

## 2013-08-26 LAB — HEMOGLOBIN A1C: Hgb A1c MFr Bld: 6.9 % — ABNORMAL HIGH (ref 4.6–6.5)

## 2013-08-26 LAB — CBC
HEMATOCRIT: 47.5 % (ref 39.0–52.0)
HEMOGLOBIN: 15.8 g/dL (ref 13.0–17.0)
MCHC: 33.3 g/dL (ref 30.0–36.0)
MCV: 97.5 fl (ref 78.0–100.0)
PLATELETS: 242 10*3/uL (ref 150.0–400.0)
RBC: 4.88 Mil/uL (ref 4.22–5.81)
RDW: 12.4 % (ref 11.5–14.6)
WBC: 5.8 10*3/uL (ref 4.5–10.5)

## 2013-08-26 MED ORDER — METHADONE HCL 10 MG PO TABS: 20.0000 mg | ORAL_TABLET | Freq: Four times a day (QID) | ORAL | Status: DC | PRN

## 2013-08-26 MED ORDER — DIAZEPAM 5 MG PO TABS: 5.0000 mg | ORAL_TABLET | Freq: Two times a day (BID) | ORAL | Status: DC | PRN

## 2013-08-26 NOTE — Assessment & Plan Note (Signed)
Continue with current prescription therapy as reflected on the Med list.  

## 2013-08-26 NOTE — Progress Notes (Signed)
   Subjective:    HPI   The patient presents for a follow-up of  Chronic LBP, hypertension, hemochromatosis, chronic dyslipidemia, type 2 diabetes controlled with medicines   BP Readings from Last 3 Encounters:  08/26/13 140/78  05/24/13 130/84  02/20/13 140/68   Wt Readings from Last 3 Encounters:  08/26/13 186 lb (84.369 kg)  05/24/13 182 lb (82.555 kg)  02/20/13 186 lb (84.369 kg)      Review of Systems  Constitutional: Negative for appetite change and unexpected weight change.  HENT: Negative for nosebleeds, sneezing and trouble swallowing.   Eyes: Negative for itching and visual disturbance.  Cardiovascular: Negative for chest pain, palpitations and leg swelling.  Gastrointestinal: Negative for nausea, blood in stool and abdominal distention.  Genitourinary: Negative for frequency and hematuria.  Musculoskeletal: Negative for back pain, gait problem, joint swelling and neck pain.  Neurological: Negative for dizziness, tremors, speech difficulty and weakness.  Psychiatric/Behavioral: Negative for suicidal ideas, sleep disturbance, dysphoric mood and agitation. The patient is not nervous/anxious.        Objective:   Physical Exam  Constitutional: He is oriented to person, place, and time. He appears well-developed. No distress.  HENT:  Mouth/Throat: Oropharynx is clear and moist.  Eyes: Conjunctivae are normal. Pupils are equal, round, and reactive to light.  Neck: Normal range of motion. No JVD present. No thyromegaly present.  Cardiovascular: Normal rate, regular rhythm, normal heart sounds and intact distal pulses.  Exam reveals no gallop and no friction rub.   No murmur heard. Pulmonary/Chest: Effort normal and breath sounds normal. No respiratory distress. He has no wheezes. He has no rales. He exhibits no tenderness.  Abdominal: Soft. Bowel sounds are normal. He exhibits no distension and no mass. There is no tenderness. There is no rebound and no guarding.   Musculoskeletal: Normal range of motion. He exhibits tenderness (LS). He exhibits no edema.  Lymphadenopathy:    He has no cervical adenopathy.  Neurological: He is alert and oriented to person, place, and time. He displays abnormal reflex. No cranial nerve deficit. He exhibits normal muscle tone. Coordination abnormal.  Skin: Skin is warm and dry. No rash noted.  Psychiatric: He has a normal mood and affect. His behavior is normal. Judgment and thought content normal.     Lab Results  Component Value Date   WBC 6.1 05/24/2013   HGB 15.6 05/24/2013   HCT 45.0 05/24/2013   PLT 236.0 05/24/2013   GLUCOSE 101* 05/24/2013   CHOL 208* 04/12/2010   TRIG 151.0* 04/12/2010   HDL 36.10* 04/12/2010   LDLDIRECT 142.3 04/12/2010   LDLCALC 129* 06/19/2009   ALT 24 05/24/2013   AST 24 05/24/2013   NA 138 05/24/2013   K 4.1 05/24/2013   CL 101 05/24/2013   CREATININE 0.7 05/24/2013   BUN 7 05/24/2013   CO2 33* 05/24/2013   TSH 0.56 04/12/2010   PSA 1.19 04/12/2010   HGBA1C 6.4 05/24/2013          Assessment & Plan:

## 2013-08-26 NOTE — Progress Notes (Signed)
Pre visit review using our clinic review tool, if applicable. No additional management support is needed unless otherwise documented below in the visit note. 

## 2013-11-25 ENCOUNTER — Other Ambulatory Visit (INDEPENDENT_AMBULATORY_CARE_PROVIDER_SITE_OTHER): Payer: 59

## 2013-11-25 ENCOUNTER — Ambulatory Visit (INDEPENDENT_AMBULATORY_CARE_PROVIDER_SITE_OTHER): Payer: 59 | Admitting: Internal Medicine

## 2013-11-25 ENCOUNTER — Encounter: Payer: Self-pay | Admitting: Internal Medicine

## 2013-11-25 ENCOUNTER — Other Ambulatory Visit: Payer: Self-pay | Admitting: *Deleted

## 2013-11-25 DIAGNOSIS — M545 Low back pain, unspecified: Secondary | ICD-10-CM

## 2013-11-25 DIAGNOSIS — E119 Type 2 diabetes mellitus without complications: Secondary | ICD-10-CM

## 2013-11-25 DIAGNOSIS — K219 Gastro-esophageal reflux disease without esophagitis: Secondary | ICD-10-CM

## 2013-11-25 DIAGNOSIS — G609 Hereditary and idiopathic neuropathy, unspecified: Secondary | ICD-10-CM

## 2013-11-25 LAB — BASIC METABOLIC PANEL
BUN: 7 mg/dL (ref 6–23)
CO2: 28 mEq/L (ref 19–32)
Calcium: 9.4 mg/dL (ref 8.4–10.5)
Chloride: 104 mEq/L (ref 96–112)
Creatinine, Ser: 0.8 mg/dL (ref 0.4–1.5)
GFR: 102.88 mL/min (ref >60.00)
Glucose, Bld: 86 mg/dL (ref 70–99)
Potassium: 4 mEq/L (ref 3.5–5.1)
SODIUM: 138 meq/L (ref 135–145)

## 2013-11-25 LAB — HEPATIC FUNCTION PANEL
ALT: 23 U/L (ref 0–53)
AST: 26 U/L (ref 0–37)
Albumin: 3.8 g/dL (ref 3.5–5.2)
Alkaline Phosphatase: 60 U/L (ref 39–117)
BILIRUBIN DIRECT: 0.1 mg/dL (ref 0.0–0.3)
Total Bilirubin: 0.7 mg/dL (ref 0.2–1.2)
Total Protein: 7.3 g/dL (ref 6.0–8.3)

## 2013-11-25 LAB — HEMOGLOBIN AND HEMATOCRIT, BLOOD
HCT: 45.2 % (ref 39.0–52.0)
Hemoglobin: 15.3 g/dL (ref 13.0–17.0)

## 2013-11-25 MED ORDER — METHADONE HCL 10 MG PO TABS: 20.0000 mg | ORAL_TABLET | Freq: Four times a day (QID) | ORAL | Status: DC | PRN

## 2013-11-25 MED ORDER — DIAZEPAM 5 MG PO TABS: 5.0000 mg | ORAL_TABLET | Freq: Two times a day (BID) | ORAL | Status: DC | PRN

## 2013-11-25 NOTE — Progress Notes (Signed)
Pre visit review using our clinic review tool, if applicable. No additional management support is needed unless otherwise documented below in the visit note. 

## 2013-11-25 NOTE — Assessment & Plan Note (Signed)
Continue with current prescription therapy as reflected on the Med list.  

## 2013-11-25 NOTE — Assessment & Plan Note (Addendum)
Continue with current phlebotomy schedule. Keep Hct <45%

## 2013-11-25 NOTE — Progress Notes (Signed)
   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 Keep Hct <45%  BP Readings from Last 3 Encounters:  11/25/13 148/72  08/26/13 140/78  05/24/13 130/84   Wt Readings from Last 3 Encounters:  11/25/13 185 lb (83.915 kg)  08/26/13 186 lb (84.369 kg)  05/24/13 182 lb (82.555 kg)      Review of Systems  Constitutional: Negative for appetite change and unexpected weight change.  HENT: Negative for nosebleeds, sneezing and trouble swallowing.   Eyes: Negative for itching and visual disturbance.  Cardiovascular: Negative for chest pain, palpitations and leg swelling.  Gastrointestinal: Negative for nausea, blood in stool and abdominal distention.  Genitourinary: Negative for frequency and hematuria.  Musculoskeletal: Negative for back pain, gait problem, joint swelling and neck pain.  Neurological: Negative for dizziness, tremors, speech difficulty and weakness.  Psychiatric/Behavioral: Negative for suicidal ideas, sleep disturbance, dysphoric mood and agitation. The patient is not nervous/anxious.        Objective:   Physical Exam  Constitutional: He is oriented to person, place, and time. He appears well-developed. No distress.  HENT:  Mouth/Throat: Oropharynx is clear and moist.  Eyes: Conjunctivae are normal. Pupils are equal, round, and reactive to light.  Neck: Normal range of motion. No JVD present. No thyromegaly present.  Cardiovascular: Normal rate, regular rhythm, normal heart sounds and intact distal pulses.  Exam reveals no gallop and no friction rub.   No murmur heard. Pulmonary/Chest: Effort normal and breath sounds normal. No respiratory distress. He has no wheezes. He has no rales. He exhibits no tenderness.  Abdominal: Soft. Bowel sounds are normal. He exhibits no distension and no mass. There is no tenderness.  There is no rebound and no guarding.  Musculoskeletal: Normal range of motion. He exhibits tenderness (LS). He exhibits no edema.  Lymphadenopathy:    He has no cervical adenopathy.  Neurological: He is alert and oriented to person, place, and time. He displays abnormal reflex. No cranial nerve deficit. He exhibits normal muscle tone. Coordination abnormal.  Skin: Skin is warm and dry. No rash noted.  Psychiatric: He has a normal mood and affect. His behavior is normal. Judgment and thought content normal.     Lab Results  Component Value Date   WBC 5.8 08/26/2013   HGB 15.3 11/25/2013   HCT 45.2 11/25/2013   PLT 242.0 08/26/2013   GLUCOSE 86 11/25/2013   CHOL 208* 04/12/2010   TRIG 151.0* 04/12/2010   HDL 36.10* 04/12/2010   LDLDIRECT 142.3 04/12/2010   LDLCALC 129* 06/19/2009   ALT 23 11/25/2013   AST 26 11/25/2013   NA 138 11/25/2013   K 4.0 11/25/2013   CL 104 11/25/2013   CREATININE 0.8 11/25/2013   BUN 7 11/25/2013   CO2 28 11/25/2013   TSH 0.56 04/12/2010   PSA 1.19 04/12/2010   HGBA1C 6.9* 08/26/2013          Assessment & Plan:

## 2014-01-20 ENCOUNTER — Telehealth: Payer: Self-pay

## 2014-01-20 DIAGNOSIS — E119 Type 2 diabetes mellitus without complications: Secondary | ICD-10-CM

## 2014-01-20 NOTE — Telephone Encounter (Signed)
Diabetic bundle- lipid ordered 

## 2014-02-25 ENCOUNTER — Other Ambulatory Visit (INDEPENDENT_AMBULATORY_CARE_PROVIDER_SITE_OTHER): Payer: 59

## 2014-02-25 ENCOUNTER — Encounter: Payer: Self-pay | Admitting: Internal Medicine

## 2014-02-25 ENCOUNTER — Ambulatory Visit (INDEPENDENT_AMBULATORY_CARE_PROVIDER_SITE_OTHER): Payer: 59 | Admitting: Internal Medicine

## 2014-02-25 VITALS — BP 171/81 | HR 87 | Temp 98.1°F | Wt 184.0 lb

## 2014-02-25 DIAGNOSIS — M5440 Lumbago with sciatica, unspecified side: Secondary | ICD-10-CM

## 2014-02-25 DIAGNOSIS — M545 Low back pain, unspecified: Secondary | ICD-10-CM

## 2014-02-25 DIAGNOSIS — E119 Type 2 diabetes mellitus without complications: Secondary | ICD-10-CM

## 2014-02-25 DIAGNOSIS — G609 Hereditary and idiopathic neuropathy, unspecified: Secondary | ICD-10-CM

## 2014-02-25 DIAGNOSIS — M5430 Sciatica, unspecified side: Secondary | ICD-10-CM

## 2014-02-25 DIAGNOSIS — K219 Gastro-esophageal reflux disease without esophagitis: Secondary | ICD-10-CM

## 2014-02-25 LAB — CBC WITH DIFFERENTIAL/PLATELET
BASOS ABS: 0 10*3/uL (ref 0.0–0.1)
Basophils Relative: 0.4 % (ref 0.0–3.0)
EOS ABS: 0.1 10*3/uL (ref 0.0–0.7)
Eosinophils Relative: 0.8 % (ref 0.0–5.0)
HCT: 43.2 % (ref 39.0–52.0)
Hemoglobin: 14.6 g/dL (ref 13.0–17.0)
LYMPHS PCT: 23.6 % (ref 12.0–46.0)
Lymphs Abs: 1.5 10*3/uL (ref 0.7–4.0)
MCHC: 34 g/dL (ref 30.0–36.0)
MCV: 91.6 fl (ref 78.0–100.0)
MONO ABS: 0.7 10*3/uL (ref 0.1–1.0)
Monocytes Relative: 11 % (ref 3.0–12.0)
NEUTROS PCT: 64.2 % (ref 43.0–77.0)
Neutro Abs: 4.1 10*3/uL (ref 1.4–7.7)
PLATELETS: 248 10*3/uL (ref 150.0–400.0)
RBC: 4.71 Mil/uL (ref 4.22–5.81)
RDW: 13.5 % (ref 11.5–15.5)
WBC: 6.4 10*3/uL (ref 4.0–10.5)

## 2014-02-25 LAB — HEPATIC FUNCTION PANEL
ALBUMIN: 3.8 g/dL (ref 3.5–5.2)
ALK PHOS: 64 U/L (ref 39–117)
ALT: 26 U/L (ref 0–53)
AST: 31 U/L (ref 0–37)
Bilirubin, Direct: 0.1 mg/dL (ref 0.0–0.3)
TOTAL PROTEIN: 7.4 g/dL (ref 6.0–8.3)
Total Bilirubin: 0.7 mg/dL (ref 0.2–1.2)

## 2014-02-25 LAB — BASIC METABOLIC PANEL
BUN: 9 mg/dL (ref 6–23)
CO2: 27 mEq/L (ref 19–32)
CREATININE: 0.9 mg/dL (ref 0.4–1.5)
Calcium: 9.6 mg/dL (ref 8.4–10.5)
Chloride: 98 mEq/L (ref 96–112)
GFR: 88.45 mL/min (ref >60.00)
Glucose, Bld: 169 mg/dL — ABNORMAL HIGH (ref 70–99)
Potassium: 4.2 mEq/L (ref 3.5–5.1)
SODIUM: 136 meq/L (ref 135–145)

## 2014-02-25 LAB — LIPID PANEL
CHOLESTEROL: 199 mg/dL (ref 0–200)
HDL: 43 mg/dL (ref >39)
LDL Cholesterol: 108 mg/dL — ABNORMAL HIGH (ref 0–99)
TRIGLYCERIDES: 239 mg/dL — AB (ref <150)
Total CHOL/HDL Ratio: 4.6 Ratio
VLDL: 48 mg/dL — ABNORMAL HIGH (ref 0–40)

## 2014-02-25 LAB — HEMOGLOBIN A1C: Hgb A1c MFr Bld: 7 % — ABNORMAL HIGH (ref 4.6–6.5)

## 2014-02-25 MED ORDER — METHADONE HCL 10 MG PO TABS: 20.0000 mg | ORAL_TABLET | Freq: Four times a day (QID) | ORAL | Status: DC | PRN

## 2014-02-25 MED ORDER — GLIMEPIRIDE 4 MG PO TABS: 4.0000 mg | ORAL_TABLET | Freq: Two times a day (BID) | ORAL | Status: DC

## 2014-02-25 MED ORDER — DIAZEPAM 5 MG PO TABS: 5.0000 mg | ORAL_TABLET | Freq: Two times a day (BID) | ORAL | Status: DC | PRN

## 2014-02-25 NOTE — Progress Notes (Signed)
Pre visit review using our clinic review tool, if applicable. No additional management support is needed unless otherwise documented below in the visit note. 

## 2014-02-25 NOTE — Progress Notes (Signed)
   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 Keeping Hct <45%  BP Readings from Last 3 Encounters:  02/25/14 171/81  11/25/13 148/72  08/26/13 140/78   Wt Readings from Last 3 Encounters:  02/25/14 184 lb (83.462 kg)  11/25/13 185 lb (83.915 kg)  08/26/13 186 lb (84.369 kg)      Review of Systems  Constitutional: Negative for appetite change and unexpected weight change.  HENT: Negative for nosebleeds, sneezing and trouble swallowing.   Eyes: Negative for itching and visual disturbance.  Cardiovascular: Negative for chest pain, palpitations and leg swelling.  Gastrointestinal: Negative for nausea, blood in stool and abdominal distention.  Genitourinary: Negative for frequency and hematuria.  Musculoskeletal: Negative for back pain, gait problem, joint swelling and neck pain.  Neurological: Negative for dizziness, tremors, speech difficulty and weakness.  Psychiatric/Behavioral: Negative for suicidal ideas, sleep disturbance, dysphoric mood and agitation. The patient is not nervous/anxious.        Objective:   Physical Exam  Constitutional: He is oriented to person, place, and time. He appears well-developed. No distress.  HENT:  Mouth/Throat: Oropharynx is clear and moist.  Eyes: Conjunctivae are normal. Pupils are equal, round, and reactive to light.  Neck: Normal range of motion. No JVD present. No thyromegaly present.  Cardiovascular: Normal rate, regular rhythm, normal heart sounds and intact distal pulses.  Exam reveals no gallop and no friction rub.   No murmur heard. Pulmonary/Chest: Effort normal and breath sounds normal. No respiratory distress. He has no wheezes. He has no rales. He exhibits no tenderness.  Abdominal: Soft. Bowel sounds are normal. He exhibits no distension and no mass. There is no  tenderness. There is no rebound and no guarding.  Musculoskeletal: Normal range of motion. He exhibits tenderness (LS). He exhibits no edema.  Lymphadenopathy:    He has no cervical adenopathy.  Neurological: He is alert and oriented to person, place, and time. He displays abnormal reflex. No cranial nerve deficit. He exhibits normal muscle tone. Coordination abnormal.  Skin: Skin is warm and dry. No rash noted.  Psychiatric: He has a normal mood and affect. His behavior is normal. Judgment and thought content normal.     Lab Results  Component Value Date   WBC 5.8 08/26/2013   HGB 15.3 11/25/2013   HCT 45.2 11/25/2013   PLT 242.0 08/26/2013   GLUCOSE 86 11/25/2013   CHOL 208* 04/12/2010   TRIG 151.0* 04/12/2010   HDL 36.10* 04/12/2010   LDLDIRECT 142.3 04/12/2010   LDLCALC 129* 06/19/2009   ALT 23 11/25/2013   AST 26 11/25/2013   NA 138 11/25/2013   K 4.0 11/25/2013   CL 104 11/25/2013   CREATININE 0.8 11/25/2013   BUN 7 11/25/2013   CO2 28 11/25/2013   TSH 0.56 04/12/2010   PSA 1.19 04/12/2010   HGBA1C 6.9* 08/26/2013          Assessment & Plan:

## 2014-03-02 NOTE — Assessment & Plan Note (Signed)
Continue with current prescription therapy as reflected on the Med list.  

## 2014-03-02 NOTE — Assessment & Plan Note (Signed)
Chronic - on phlebotomies Keep Hct <45% 

## 2014-05-01 ENCOUNTER — Telehealth: Payer: Self-pay

## 2014-05-01 NOTE — Telephone Encounter (Signed)
Left a message for call back.  Called patient regarding diabetic eye exam.  When patient calls back please ask:  Have you had a recent (2014-2015) eye exam?    Date of Exam?  Where?    

## 2014-05-10 ENCOUNTER — Telehealth: Payer: Self-pay

## 2014-05-10 NOTE — Telephone Encounter (Signed)
LVM for pt to call back.    RE: time change to 10:30 for 11/13 appointment.

## 2014-05-12 ENCOUNTER — Telehealth: Payer: Self-pay | Admitting: Internal Medicine

## 2014-05-12 NOTE — Telephone Encounter (Signed)
Patient is confused on appt time.  Please call back.

## 2014-05-26 ENCOUNTER — Ambulatory Visit (INDEPENDENT_AMBULATORY_CARE_PROVIDER_SITE_OTHER): Payer: 59 | Admitting: Internal Medicine

## 2014-05-26 ENCOUNTER — Other Ambulatory Visit (INDEPENDENT_AMBULATORY_CARE_PROVIDER_SITE_OTHER): Payer: 59

## 2014-05-26 ENCOUNTER — Encounter: Payer: Self-pay | Admitting: Internal Medicine

## 2014-05-26 VITALS — BP 140/82 | HR 90 | Temp 98.8°F | Ht 72.0 in | Wt 184.0 lb

## 2014-05-26 DIAGNOSIS — Z23 Encounter for immunization: Secondary | ICD-10-CM

## 2014-05-26 DIAGNOSIS — M545 Low back pain, unspecified: Secondary | ICD-10-CM

## 2014-05-26 DIAGNOSIS — M5440 Lumbago with sciatica, unspecified side: Secondary | ICD-10-CM

## 2014-05-26 DIAGNOSIS — G609 Hereditary and idiopathic neuropathy, unspecified: Secondary | ICD-10-CM

## 2014-05-26 DIAGNOSIS — E119 Type 2 diabetes mellitus without complications: Secondary | ICD-10-CM

## 2014-05-26 DIAGNOSIS — K219 Gastro-esophageal reflux disease without esophagitis: Secondary | ICD-10-CM

## 2014-05-26 DIAGNOSIS — R945 Abnormal results of liver function studies: Secondary | ICD-10-CM

## 2014-05-26 DIAGNOSIS — Z0000 Encounter for general adult medical examination without abnormal findings: Secondary | ICD-10-CM

## 2014-05-26 DIAGNOSIS — N320 Bladder-neck obstruction: Secondary | ICD-10-CM

## 2014-05-26 LAB — HEPATIC FUNCTION PANEL
ALK PHOS: 59 U/L (ref 39–117)
ALT: 23 U/L (ref 0–53)
AST: 26 U/L (ref 0–37)
Albumin: 3.4 g/dL — ABNORMAL LOW (ref 3.5–5.2)
BILIRUBIN DIRECT: 0.1 mg/dL (ref 0.0–0.3)
TOTAL PROTEIN: 7.5 g/dL (ref 6.0–8.3)
Total Bilirubin: 0.5 mg/dL (ref 0.2–1.2)

## 2014-05-26 LAB — CBC WITH DIFFERENTIAL/PLATELET
Basophils Absolute: 0 10*3/uL (ref 0.0–0.1)
Basophils Relative: 0.4 % (ref 0.0–3.0)
EOS ABS: 0.1 10*3/uL (ref 0.0–0.7)
Eosinophils Relative: 1.2 % (ref 0.0–5.0)
HCT: 43.6 % (ref 39.0–52.0)
Hemoglobin: 14.4 g/dL (ref 13.0–17.0)
Lymphocytes Relative: 24.9 % (ref 12.0–46.0)
Lymphs Abs: 1.7 10*3/uL (ref 0.7–4.0)
MCHC: 33 g/dL (ref 30.0–36.0)
MCV: 88.5 fl (ref 78.0–100.0)
MONOS PCT: 10 % (ref 3.0–12.0)
Monocytes Absolute: 0.7 10*3/uL (ref 0.1–1.0)
NEUTROS PCT: 63.5 % (ref 43.0–77.0)
Neutro Abs: 4.4 10*3/uL (ref 1.4–7.7)
Platelets: 278 10*3/uL (ref 150.0–400.0)
RBC: 4.92 Mil/uL (ref 4.22–5.81)
RDW: 13.4 % (ref 11.5–15.5)
WBC: 6.9 10*3/uL (ref 4.0–10.5)

## 2014-05-26 LAB — BASIC METABOLIC PANEL WITH GFR
BUN: 8 mg/dL (ref 6–23)
CO2: 29 meq/L (ref 19–32)
Calcium: 9.7 mg/dL (ref 8.4–10.5)
Chloride: 102 meq/L (ref 96–112)
Creatinine, Ser: 0.8 mg/dL (ref 0.4–1.5)
GFR: 99.81 mL/min
Glucose, Bld: 166 mg/dL — ABNORMAL HIGH (ref 70–99)
Potassium: 4.4 meq/L (ref 3.5–5.1)
Sodium: 137 meq/L (ref 135–145)

## 2014-05-26 LAB — HEMOGLOBIN A1C: HEMOGLOBIN A1C: 7.3 % — AB (ref 4.6–6.5)

## 2014-05-26 MED ORDER — METHADONE HCL 10 MG PO TABS: 20.0000 mg | ORAL_TABLET | Freq: Four times a day (QID) | ORAL | Status: DC | PRN

## 2014-05-26 NOTE — Assessment & Plan Note (Signed)
Chronic, related to hemochromatosis Worse Continue with current prescription therapy as reflected on the Med list.

## 2014-05-26 NOTE — Patient Instructions (Signed)

## 2014-05-26 NOTE — Progress Notes (Signed)
Subjective:    HPI  The patient is here for a wellness exam.   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 Keeping Hct <45%  BP Readings from Last 3 Encounters:  05/26/14 140/82  02/25/14 171/81  11/25/13 148/72   Wt Readings from Last 3 Encounters:  05/26/14 184 lb (83.462 kg)  02/25/14 184 lb (83.462 kg)  11/25/13 185 lb (83.915 kg)      Review of Systems  Constitutional: Negative for appetite change and unexpected weight change.  HENT: Negative for nosebleeds, sneezing and trouble swallowing.   Eyes: Negative for itching and visual disturbance.  Cardiovascular: Negative for chest pain, palpitations and leg swelling.  Gastrointestinal: Negative for nausea, blood in stool and abdominal distention.  Genitourinary: Negative for frequency and hematuria.  Musculoskeletal: Negative for back pain, joint swelling, gait problem and neck pain.  Neurological: Negative for dizziness, tremors, speech difficulty and weakness.  Psychiatric/Behavioral: Negative for suicidal ideas, sleep disturbance, dysphoric mood and agitation. The patient is not nervous/anxious.        Objective:   Physical Exam  Constitutional: He is oriented to person, place, and time. He appears well-developed and well-nourished. No distress.  HENT:  Head: Normocephalic and atraumatic.  Right Ear: External ear normal.  Left Ear: External ear normal.  Nose: Nose normal.  Mouth/Throat: Oropharynx is clear and moist. No oropharyngeal exudate.  Eyes: Conjunctivae and EOM are normal. Pupils are equal, round, and reactive to light. Right eye exhibits no discharge. Left eye exhibits no discharge. No scleral icterus.  Neck: Normal range of motion. Neck supple. No JVD present. No tracheal deviation present. No thyromegaly present.  Cardiovascular: Normal rate, regular rhythm, normal  heart sounds and intact distal pulses.  Exam reveals no gallop and no friction rub.   No murmur heard. Pulmonary/Chest: Effort normal and breath sounds normal. No stridor. No respiratory distress. He has no wheezes. He has no rales. He exhibits no tenderness.  Abdominal: Soft. Bowel sounds are normal. He exhibits no distension and no mass. There is no tenderness. There is no rebound and no guarding.  Genitourinary: Rectum normal, prostate normal and penis normal. Guaiac negative stool. No penile tenderness.  Musculoskeletal: Normal range of motion. He exhibits no edema or tenderness.  Lymphadenopathy:    He has no cervical adenopathy.  Neurological: He is alert and oriented to person, place, and time. He has normal reflexes. No cranial nerve deficit. He exhibits normal muscle tone. Coordination normal.  Skin: Skin is warm and dry. No rash noted. He is not diaphoretic. No erythema. No pallor.  Psychiatric: He has a normal mood and affect. His behavior is normal. Judgment and thought content normal.     Lab Results  Component Value Date   WBC 6.9 05/26/2014   HGB 14.4 05/26/2014   HCT 43.6 05/26/2014   PLT 278.0 05/26/2014   GLUCOSE 166* 05/26/2014   CHOL 199 02/25/2014   TRIG 239* 02/25/2014   HDL 43 02/25/2014   LDLDIRECT 142.3 04/12/2010   LDLCALC 108* 02/25/2014   ALT 23 05/26/2014   AST 26 05/26/2014   NA 137 05/26/2014   K 4.4 05/26/2014   CL 102 05/26/2014   CREATININE 0.8 05/26/2014   BUN 8 05/26/2014   CO2 29 05/26/2014   TSH 0.56 04/12/2010   PSA 1.19 04/12/2010   HGBA1C 7.3* 05/26/2014          Assessment &  Plan:

## 2014-05-26 NOTE — Assessment & Plan Note (Signed)
Due to hemochromatosis

## 2014-05-26 NOTE — Assessment & Plan Note (Signed)
Here for medicare wellness/physical  Diet: heart healthy  Physical activity: sedentary  Depression/mood screen: negative  Hearing: intact to whispered voice  Visual acuity: grossly normal, performs annual eye exam  ADLs: capable  Fall risk: increased due to ataxia/LE weakness Home safety: good  Cognitive evaluation: intact to orientation, naming, recall and repetition  EOL planning: adv directives, full code/ I agree  I have personally reviewed and have noted  1. The patient's medical and social history  2. Their use of alcohol, tobacco or illicit drugs  3. Their current medications and supplements  4. The patient's functional ability including ADL's, fall risks, home safety risks and hearing or visual impairment.  5. Diet and physical activities  6. Evidence for depression or mood disorders    Today patient counseled on age appropriate routine health concerns for screening and prevention, each reviewed and up to date or declined. Immunizations reviewed and up to date or declined. Labs ordered and reviewed. Risk factors for depression reviewed and negative. Hearing function and visual acuity are intact. ADLs screened and addressed as needed. Functional ability and level of safety reviewed and appropriate. Education, counseling and referrals performed based on assessed risks today. Patient provided with a copy of personalized plan for preventive services.

## 2014-05-26 NOTE — Progress Notes (Signed)
Pre visit review using our clinic review tool, if applicable. No additional management support is needed unless otherwise documented below in the visit note. 

## 2014-05-26 NOTE — Assessment & Plan Note (Signed)
Chronic and severe   Potential benefits of a long term opioids use as well as potential risks (i.e. addiction risk, apnea etc) and complications (i.e. Somnolence, constipation and others) were explained to the patient and were aknowledged.  Continue with current prescription therapy as reflected on the Med list.

## 2014-05-30 ENCOUNTER — Encounter: Payer: 59 | Admitting: Internal Medicine

## 2014-05-30 ENCOUNTER — Ambulatory Visit: Payer: 59 | Admitting: Internal Medicine

## 2014-06-16 NOTE — Telephone Encounter (Signed)
No call back from patient.  Encounter closed.   

## 2014-07-28 ENCOUNTER — Encounter: Payer: Self-pay | Admitting: Gastroenterology

## 2014-08-26 ENCOUNTER — Other Ambulatory Visit (INDEPENDENT_AMBULATORY_CARE_PROVIDER_SITE_OTHER): Payer: Medicare Other

## 2014-08-26 ENCOUNTER — Encounter: Payer: Self-pay | Admitting: Internal Medicine

## 2014-08-26 ENCOUNTER — Ambulatory Visit (INDEPENDENT_AMBULATORY_CARE_PROVIDER_SITE_OTHER): Payer: Medicare Other | Admitting: Internal Medicine

## 2014-08-26 VITALS — BP 148/80 | HR 77 | Temp 97.8°F | Wt 180.0 lb

## 2014-08-26 DIAGNOSIS — E119 Type 2 diabetes mellitus without complications: Secondary | ICD-10-CM

## 2014-08-26 DIAGNOSIS — M5440 Lumbago with sciatica, unspecified side: Secondary | ICD-10-CM

## 2014-08-26 DIAGNOSIS — Z23 Encounter for immunization: Secondary | ICD-10-CM

## 2014-08-26 LAB — BASIC METABOLIC PANEL
BUN: 11 mg/dL (ref 6–23)
CO2: 31 mEq/L (ref 19–32)
CREATININE: 0.82 mg/dL (ref 0.40–1.50)
Calcium: 10.1 mg/dL (ref 8.4–10.5)
Chloride: 101 mEq/L (ref 96–112)
GFR: 98.34 mL/min (ref >60.00)
Glucose, Bld: 113 mg/dL — ABNORMAL HIGH (ref 70–99)
POTASSIUM: 4.3 meq/L (ref 3.5–5.1)
Sodium: 137 mEq/L (ref 135–145)

## 2014-08-26 LAB — CBC WITH DIFFERENTIAL/PLATELET
BASOS ABS: 0 10*3/uL (ref 0.0–0.1)
Basophils Relative: 0.5 % (ref 0.0–3.0)
EOS ABS: 0.1 10*3/uL (ref 0.0–0.7)
Eosinophils Relative: 1.4 % (ref 0.0–5.0)
HCT: 40.6 % (ref 39.0–52.0)
Hemoglobin: 13.5 g/dL (ref 13.0–17.0)
LYMPHS PCT: 31.2 % (ref 12.0–46.0)
Lymphs Abs: 1.9 10*3/uL (ref 0.7–4.0)
MCHC: 33.3 g/dL (ref 30.0–36.0)
MCV: 83.6 fl (ref 78.0–100.0)
Monocytes Absolute: 0.8 10*3/uL (ref 0.1–1.0)
Monocytes Relative: 12.9 % — ABNORMAL HIGH (ref 3.0–12.0)
NEUTROS PCT: 54 % (ref 43.0–77.0)
Neutro Abs: 3.3 10*3/uL (ref 1.4–7.7)
PLATELETS: 275 10*3/uL (ref 150.0–400.0)
RBC: 4.86 Mil/uL (ref 4.22–5.81)
RDW: 14.9 % (ref 11.5–15.5)
WBC: 6.1 10*3/uL (ref 4.0–10.5)

## 2014-08-26 LAB — HEPATIC FUNCTION PANEL
ALT: 20 U/L (ref 0–53)
AST: 22 U/L (ref 0–37)
Albumin: 4.2 g/dL (ref 3.5–5.2)
Alkaline Phosphatase: 67 U/L (ref 39–117)
Bilirubin, Direct: 0.1 mg/dL (ref 0.0–0.3)
TOTAL PROTEIN: 7.5 g/dL (ref 6.0–8.3)
Total Bilirubin: 0.5 mg/dL (ref 0.2–1.2)

## 2014-08-26 LAB — HEMOGLOBIN A1C: Hgb A1c MFr Bld: 7.3 % — ABNORMAL HIGH (ref 4.6–6.5)

## 2014-08-26 MED ORDER — METHADONE HCL 10 MG PO TABS: 20.0000 mg | ORAL_TABLET | Freq: Four times a day (QID) | ORAL | Status: DC | PRN

## 2014-08-26 MED ORDER — DIAZEPAM 5 MG PO TABS: 5.0000 mg | ORAL_TABLET | Freq: Two times a day (BID) | ORAL | Status: DC | PRN

## 2014-08-26 NOTE — Assessment & Plan Note (Signed)
Continue with current prescription therapy as reflected on the Med list. Low carb diet

## 2014-08-26 NOTE — Progress Notes (Signed)
   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 Keeping Hct <45%  BP Readings from Last 3 Encounters:  08/26/14 148/80  05/26/14 140/82  02/25/14 171/81   Wt Readings from Last 3 Encounters:  08/26/14 180 lb (81.647 kg)  05/26/14 184 lb (83.462 kg)  02/25/14 184 lb (83.462 kg)      Review of Systems  Constitutional: Negative for appetite change and unexpected weight change.  HENT: Negative for nosebleeds, sneezing and trouble swallowing.   Eyes: Negative for itching and visual disturbance.  Cardiovascular: Negative for chest pain, palpitations and leg swelling.  Gastrointestinal: Negative for nausea, blood in stool and abdominal distention.  Genitourinary: Negative for frequency and hematuria.  Musculoskeletal: Negative for back pain, joint swelling, gait problem and neck pain.  Neurological: Negative for dizziness, tremors, speech difficulty and weakness.  Psychiatric/Behavioral: Negative for suicidal ideas, sleep disturbance, dysphoric mood and agitation. The patient is not nervous/anxious.        Objective:   Physical Exam  Constitutional: He is oriented to person, place, and time. He appears well-developed. No distress.  HENT:  Mouth/Throat: Oropharynx is clear and moist.  Eyes: Conjunctivae are normal. Pupils are equal, round, and reactive to light.  Neck: Normal range of motion. No JVD present. No thyromegaly present.  Cardiovascular: Normal rate, regular rhythm, normal heart sounds and intact distal pulses.  Exam reveals no gallop and no friction rub.   No murmur heard. Pulmonary/Chest: Effort normal and breath sounds normal. No respiratory distress. He has no wheezes. He has no rales. He exhibits no tenderness.  Abdominal: Soft. Bowel sounds are normal. He exhibits no distension and no mass. There is no  tenderness. There is no rebound and no guarding.  Musculoskeletal: Normal range of motion. He exhibits tenderness (LS). He exhibits no edema.  Lymphadenopathy:    He has no cervical adenopathy.  Neurological: He is alert and oriented to person, place, and time. He displays abnormal reflex. No cranial nerve deficit. He exhibits normal muscle tone. Coordination abnormal.  Skin: Skin is warm and dry. No rash noted.  Psychiatric: He has a normal mood and affect. His behavior is normal. Judgment and thought content normal.     Lab Results  Component Value Date   WBC 6.1 08/26/2014   HGB 13.5 08/26/2014   HCT 40.6 08/26/2014   PLT 275.0 08/26/2014   GLUCOSE 113* 08/26/2014   CHOL 199 02/25/2014   TRIG 239* 02/25/2014   HDL 43 02/25/2014   LDLDIRECT 142.3 04/12/2010   LDLCALC 108* 02/25/2014   ALT 20 08/26/2014   AST 22 08/26/2014   NA 137 08/26/2014   K 4.3 08/26/2014   CL 101 08/26/2014   CREATININE 0.82 08/26/2014   BUN 11 08/26/2014   CO2 31 08/26/2014   TSH 0.56 04/12/2010   PSA 1.19 04/12/2010   HGBA1C 7.3* 08/26/2014          Assessment & Plan:

## 2014-08-26 NOTE — Progress Notes (Signed)
Pre visit review using our clinic review tool, if applicable. No additional management support is needed unless otherwise documented below in the visit note. 

## 2014-08-26 NOTE — Assessment & Plan Note (Signed)
Continue with current prescription therapy as reflected on the Med list.  Potential benefits of a long term steroid  use as well as potential risks  and complications were explained to the patient and were aknowledged.

## 2014-08-31 NOTE — Assessment & Plan Note (Signed)
Cont w/phlebotomies 

## 2014-11-24 ENCOUNTER — Ambulatory Visit: Payer: Medicare Other | Admitting: Internal Medicine

## 2014-12-12 ENCOUNTER — Ambulatory Visit (INDEPENDENT_AMBULATORY_CARE_PROVIDER_SITE_OTHER): Payer: Medicare Other | Admitting: Internal Medicine

## 2014-12-12 ENCOUNTER — Other Ambulatory Visit (INDEPENDENT_AMBULATORY_CARE_PROVIDER_SITE_OTHER): Payer: Medicare Other

## 2014-12-12 ENCOUNTER — Encounter: Payer: Self-pay | Admitting: Internal Medicine

## 2014-12-12 VITALS — BP 160/70 | HR 88 | Temp 97.6°F | Ht 72.0 in | Wt 182.8 lb

## 2014-12-12 DIAGNOSIS — M5440 Lumbago with sciatica, unspecified side: Secondary | ICD-10-CM | POA: Diagnosis not present

## 2014-12-12 DIAGNOSIS — E119 Type 2 diabetes mellitus without complications: Secondary | ICD-10-CM

## 2014-12-12 DIAGNOSIS — N320 Bladder-neck obstruction: Secondary | ICD-10-CM | POA: Diagnosis not present

## 2014-12-12 LAB — BASIC METABOLIC PANEL
BUN: 9 mg/dL (ref 6–23)
CHLORIDE: 101 meq/L (ref 96–112)
CO2: 29 meq/L (ref 19–32)
Calcium: 9.5 mg/dL (ref 8.4–10.5)
Creatinine, Ser: 0.82 mg/dL (ref 0.40–1.50)
GFR: 98.26 mL/min (ref >60.00)
GLUCOSE: 197 mg/dL — AB (ref 70–99)
POTASSIUM: 4.3 meq/L (ref 3.5–5.1)
SODIUM: 136 meq/L (ref 135–145)

## 2014-12-12 LAB — HEPATIC FUNCTION PANEL
ALBUMIN: 4 g/dL (ref 3.5–5.2)
ALT: 17 U/L (ref 0–53)
AST: 20 U/L (ref 0–37)
Alkaline Phosphatase: 70 U/L (ref 39–117)
BILIRUBIN TOTAL: 0.3 mg/dL (ref 0.2–1.2)
Bilirubin, Direct: 0.1 mg/dL (ref 0.0–0.3)
Total Protein: 7.1 g/dL (ref 6.0–8.3)

## 2014-12-12 LAB — CBC WITH DIFFERENTIAL/PLATELET
Basophils Absolute: 0 10*3/uL (ref 0.0–0.1)
Basophils Relative: 0.4 % (ref 0.0–3.0)
EOS PCT: 1.2 % (ref 0.0–5.0)
Eosinophils Absolute: 0.1 10*3/uL (ref 0.0–0.7)
HCT: 37.2 % — ABNORMAL LOW (ref 39.0–52.0)
Hemoglobin: 12.2 g/dL — ABNORMAL LOW (ref 13.0–17.0)
Lymphocytes Relative: 30.6 % (ref 12.0–46.0)
Lymphs Abs: 1.8 10*3/uL (ref 0.7–4.0)
MCHC: 32.7 g/dL (ref 30.0–36.0)
MCV: 79.3 fl (ref 78.0–100.0)
MONOS PCT: 13 % — AB (ref 3.0–12.0)
Monocytes Absolute: 0.8 10*3/uL (ref 0.1–1.0)
NEUTROS PCT: 54.8 % (ref 43.0–77.0)
Neutro Abs: 3.3 10*3/uL (ref 1.4–7.7)
Platelets: 299 10*3/uL (ref 150.0–400.0)
RBC: 4.7 Mil/uL (ref 4.22–5.81)
RDW: 15.5 % (ref 11.5–15.5)
WBC: 5.9 10*3/uL (ref 4.0–10.5)

## 2014-12-12 LAB — HEMOGLOBIN A1C: HEMOGLOBIN A1C: 7.5 % — AB (ref 4.6–6.5)

## 2014-12-12 LAB — TSH: TSH: 0.46 u[IU]/mL (ref 0.35–4.50)

## 2014-12-12 LAB — PSA: PSA: 1.33 ng/mL (ref 0.10–4.00)

## 2014-12-12 MED ORDER — METHADONE HCL 10 MG PO TABS: 20.0000 mg | ORAL_TABLET | Freq: Four times a day (QID) | ORAL | Status: DC | PRN

## 2014-12-12 NOTE — Assessment & Plan Note (Signed)
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 addiction risk, apnea etc) and complications (i.e. Somnolence, constipation and others) were explained to the patient and were aknowledged. Benzo use risk is discussed

## 2014-12-12 NOTE — Assessment & Plan Note (Signed)
Chronic, related to hemochromatosis On Glimeperide

## 2014-12-12 NOTE — Assessment & Plan Note (Signed)
Chronic - on phlebotomies Keep Hct <45%

## 2014-12-12 NOTE — Progress Notes (Signed)
   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 Keeping Hct <45%  BP Readings from Last 3 Encounters:  12/12/14 160/70  08/26/14 148/80  05/26/14 140/82   Wt Readings from Last 3 Encounters:  12/12/14 182 lb 12 oz (82.895 kg)  08/26/14 180 lb (81.647 kg)  05/26/14 184 lb (83.462 kg)      Review of Systems  Constitutional: Negative for appetite change and unexpected weight change.  HENT: Negative for nosebleeds, sneezing and trouble swallowing.   Eyes: Negative for itching and visual disturbance.  Cardiovascular: Negative for chest pain, palpitations and leg swelling.  Gastrointestinal: Negative for nausea, blood in stool and abdominal distention.  Genitourinary: Negative for frequency and hematuria.  Musculoskeletal: Negative for back pain, joint swelling, gait problem and neck pain.  Neurological: Negative for dizziness, tremors, speech difficulty and weakness.  Psychiatric/Behavioral: Negative for suicidal ideas, sleep disturbance, dysphoric mood and agitation. The patient is not nervous/anxious.        Objective:   Physical Exam  Constitutional: He is oriented to person, place, and time. He appears well-developed. No distress.  HENT:  Mouth/Throat: Oropharynx is clear and moist.  Eyes: Conjunctivae are normal. Pupils are equal, round, and reactive to light.  Neck: Normal range of motion. No JVD present. No thyromegaly present.  Cardiovascular: Normal rate, regular rhythm, normal heart sounds and intact distal pulses.  Exam reveals no gallop and no friction rub.   No murmur heard. Pulmonary/Chest: Effort normal and breath sounds normal. No respiratory distress. He has no wheezes. He has no rales. He exhibits no tenderness.  Abdominal: Soft. Bowel sounds are normal. He exhibits no distension and no mass. There is no  tenderness. There is no rebound and no guarding.  Musculoskeletal: Normal range of motion. He exhibits tenderness (LS). He exhibits no edema.  Lymphadenopathy:    He has no cervical adenopathy.  Neurological: He is alert and oriented to person, place, and time. He displays abnormal reflex. No cranial nerve deficit. He exhibits normal muscle tone. Coordination abnormal.  Skin: Skin is warm and dry. No rash noted.  Psychiatric: He has a normal mood and affect. His behavior is normal. Judgment and thought content normal.  L wrist is a little tender to palp - ulnar aspect   Lab Results  Component Value Date   WBC 5.9 12/12/2014   HGB 12.2* 12/12/2014   HCT 37.2* 12/12/2014   PLT 299.0 12/12/2014   GLUCOSE 197* 12/12/2014   CHOL 199 02/25/2014   TRIG 239* 02/25/2014   HDL 43 02/25/2014   LDLDIRECT 142.3 04/12/2010   LDLCALC 108* 02/25/2014   ALT 17 12/12/2014   AST 20 12/12/2014   NA 136 12/12/2014   K 4.3 12/12/2014   CL 101 12/12/2014   CREATININE 0.82 12/12/2014   BUN 9 12/12/2014   CO2 29 12/12/2014   TSH 0.46 12/12/2014   PSA 1.33 12/12/2014   HGBA1C 7.5* 12/12/2014          Assessment & Plan:

## 2014-12-12 NOTE — Progress Notes (Signed)
Pre visit review using our clinic review tool, if applicable. No additional management support is needed unless otherwise documented below in the visit note. 

## 2015-03-10 ENCOUNTER — Telehealth: Payer: Self-pay | Admitting: Internal Medicine

## 2015-03-10 MED ORDER — RABEPRAZOLE SODIUM 20 MG PO TBEC: 20.0000 mg | DELAYED_RELEASE_TABLET | Freq: Every day | ORAL | Status: DC

## 2015-03-10 NOTE — Telephone Encounter (Signed)
Ok Thx 

## 2015-03-10 NOTE — Telephone Encounter (Signed)
Patient is experiencing some acid reflux and he his appointment isn't until Sept 2. He has taken aciphex 20 before and is wondering if you can prescribe him this until he gets in to see you. Pharmacy is [facility] on [address]

## 2015-03-12 NOTE — Telephone Encounter (Signed)
Left detailed mess informing pt of below.  

## 2015-03-18 ENCOUNTER — Ambulatory Visit: Payer: Medicare Other | Admitting: Internal Medicine

## 2015-03-20 ENCOUNTER — Other Ambulatory Visit (INDEPENDENT_AMBULATORY_CARE_PROVIDER_SITE_OTHER): Payer: Medicare Other

## 2015-03-20 ENCOUNTER — Encounter: Payer: Self-pay | Admitting: Internal Medicine

## 2015-03-20 ENCOUNTER — Ambulatory Visit (INDEPENDENT_AMBULATORY_CARE_PROVIDER_SITE_OTHER): Payer: Medicare Other | Admitting: Internal Medicine

## 2015-03-20 VITALS — BP 140/72 | HR 80 | Wt 181.0 lb

## 2015-03-20 DIAGNOSIS — E119 Type 2 diabetes mellitus without complications: Secondary | ICD-10-CM | POA: Diagnosis not present

## 2015-03-20 DIAGNOSIS — R1010 Upper abdominal pain, unspecified: Secondary | ICD-10-CM

## 2015-03-20 DIAGNOSIS — M5440 Lumbago with sciatica, unspecified side: Secondary | ICD-10-CM | POA: Diagnosis not present

## 2015-03-20 LAB — CBC WITH DIFFERENTIAL/PLATELET
Basophils Absolute: 0 10*3/uL (ref 0.0–0.1)
Basophils Relative: 0.1 % (ref 0.0–3.0)
EOS PCT: 1.5 % (ref 0.0–5.0)
Eosinophils Absolute: 0.1 10*3/uL (ref 0.0–0.7)
HCT: 36 % — ABNORMAL LOW (ref 39.0–52.0)
HEMOGLOBIN: 11.6 g/dL — AB (ref 13.0–17.0)
Lymphocytes Relative: 28.2 % (ref 12.0–46.0)
Lymphs Abs: 1.6 10*3/uL (ref 0.7–4.0)
MCHC: 32.2 g/dL (ref 30.0–36.0)
MCV: 77.1 fl — ABNORMAL LOW (ref 78.0–100.0)
MONOS PCT: 10.3 % (ref 3.0–12.0)
Monocytes Absolute: 0.6 10*3/uL (ref 0.1–1.0)
Neutro Abs: 3.3 10*3/uL (ref 1.4–7.7)
Neutrophils Relative %: 59.9 % (ref 43.0–77.0)
Platelets: 319 10*3/uL (ref 150.0–400.0)
RBC: 4.67 Mil/uL (ref 4.22–5.81)
RDW: 16.1 % — ABNORMAL HIGH (ref 11.5–15.5)
WBC: 5.5 10*3/uL (ref 4.0–10.5)

## 2015-03-20 LAB — BASIC METABOLIC PANEL
BUN: 8 mg/dL (ref 6–23)
CALCIUM: 9.3 mg/dL (ref 8.4–10.5)
CO2: 29 meq/L (ref 19–32)
Chloride: 103 mEq/L (ref 96–112)
Creatinine, Ser: 0.87 mg/dL (ref 0.40–1.50)
GFR: 91.7 mL/min (ref >60.00)
GLUCOSE: 154 mg/dL — AB (ref 70–99)
Potassium: 4.1 mEq/L (ref 3.5–5.1)
Sodium: 138 mEq/L (ref 135–145)

## 2015-03-20 LAB — HEPATIC FUNCTION PANEL
ALBUMIN: 3.9 g/dL (ref 3.5–5.2)
ALT: 15 U/L (ref 0–53)
AST: 18 U/L (ref 0–37)
Alkaline Phosphatase: 67 U/L (ref 39–117)
BILIRUBIN DIRECT: 0.1 mg/dL (ref 0.0–0.3)
TOTAL PROTEIN: 7.4 g/dL (ref 6.0–8.3)
Total Bilirubin: 0.4 mg/dL (ref 0.2–1.2)

## 2015-03-20 LAB — HEMOGLOBIN A1C: Hgb A1c MFr Bld: 7.5 % — ABNORMAL HIGH (ref 4.6–6.5)

## 2015-03-20 MED ORDER — PANTOPRAZOLE SODIUM 40 MG PO TBEC: 40.0000 mg | DELAYED_RELEASE_TABLET | Freq: Every day | ORAL | Status: DC

## 2015-03-20 MED ORDER — METHADONE HCL 10 MG PO TABS: 20.0000 mg | ORAL_TABLET | Freq: Four times a day (QID) | ORAL | Status: DC | PRN

## 2015-03-20 NOTE — Progress Notes (Signed)
Pre visit review using our clinic review tool, if applicable. No additional management support is needed unless otherwise documented below in the visit note. 

## 2015-03-20 NOTE — Progress Notes (Signed)
Subjective:  Patient ID: Gabriel Erickson, male    DOB: 1940/11/20  Age: 74 y.o. MRN: [id number]  CC: No chief complaint on file.   HPI Thad Osoria Heft presents for LBP,GERD, DM, hemochromatosis f/u. C/o mid-abd and LLQ abd pain x weeks. Pt has epig pain after meals...  Outpatient Prescriptions Prior to Visit  Medication Sig Dispense Refill  . ACCU-CHEK SOFTCLIX LANCETS lancets by Other route 2 (two) times daily. Use as instructed     . b complex vitamins tablet Take 1 tablet by mouth daily.      . Cholecalciferol 1000 UNITS tablet Take 1,000 Units by mouth daily.      . diazepam (VALIUM) 5 MG tablet Take 1 tablet (5 mg total) by mouth 2 (two) times daily as needed for muscle spasms (spasms). For cramps or insomnia 60 tablet 5  . fexofenadine-pseudoephedrine (ALLEGRA-D 24) 180-240 MG per 24 hr tablet Take 1 tablet by mouth daily. As needed for allergies     . fluticasone (FLONASE) 50 MCG/ACT nasal spray Place 2 sprays into the nose daily. 16 g 11  . glimepiride (AMARYL) 4 MG tablet Take 1 tablet (4 mg total) by mouth 2 (two) times daily. 60 tablet 11  . glucose blood (ONE TOUCH ULTRA TEST) test strip 1 each by Other route 2 (two) times daily. Dx 250.00 fluctuating blood sugar     . pseudoephedrine-guaifenesin (MUCINEX D) 60-600 MG per tablet Take 1 tablet by mouth every 12 (twelve) hours.    . RABEprazole (ACIPHEX) 20 MG tablet Take 1 tablet (20 mg total) by mouth daily. 30 tablet 5  . triamcinolone (KENALOG) 0.5 % cream Apply topically 2 (two) times daily as needed.      . methadone (DOLOPHINE) 10 MG tablet Take 2 tablets (20 mg total) by mouth 4 (four) times daily as needed for severe pain. For pain - FILL ON OR AFTER 02/16/2015 240 tablet 0  . ranitidine (ZANTAC) 75 MG tablet Take 75 mg by mouth daily.       No facility-administered medications prior to visit.    ROS Review of Systems  Constitutional: Positive for fatigue. Negative for appetite change and unexpected weight change.  HENT:  Negative for congestion, nosebleeds, sneezing, sore throat and trouble swallowing.   Eyes: Negative for itching and visual disturbance.  Respiratory: Negative for cough.   Cardiovascular: Negative for chest pain, palpitations and leg swelling.  Gastrointestinal: Positive for nausea, abdominal pain, constipation and abdominal distention. Negative for vomiting, diarrhea and blood in stool.  Genitourinary: Negative for urgency, frequency, hematuria and decreased urine volume.  Musculoskeletal: Positive for back pain. Negative for joint swelling, gait problem and neck pain.  Skin: Negative for rash.  Neurological: Positive for weakness. Negative for dizziness, tremors and speech difficulty.  Psychiatric/Behavioral: Negative for sleep disturbance, dysphoric mood and agitation. The patient is not nervous/anxious.     Objective:  BP 140/72 mmHg  Pulse 80  Wt 181 lb (82.101 kg)  SpO2 98%  BP Readings from Last 3 Encounters:  03/20/15 140/72  12/12/14 160/70  08/26/14 148/80    Wt Readings from Last 3 Encounters:  03/20/15 181 lb (82.101 kg)  12/12/14 182 lb 12 oz (82.895 kg)  08/26/14 180 lb (81.647 kg)    Physical Exam  Constitutional: He is oriented to person, place, and time. He appears well-developed. No distress.  NAD  HENT:  Mouth/Throat: Oropharynx is clear and moist.  Eyes: Conjunctivae are normal. Pupils are equal, round, and reactive to  light.  Neck: Normal range of motion. No JVD present. No thyromegaly present.  Cardiovascular: Normal rate, regular rhythm, normal heart sounds and intact distal pulses.  Exam reveals no gallop and no friction rub.   No murmur heard. Pulmonary/Chest: Effort normal and breath sounds normal. No respiratory distress. He has no wheezes. He has no rales. He exhibits no tenderness.  Abdominal: Soft. Bowel sounds are normal. He exhibits no distension and no mass. There is tenderness. There is no rebound and no guarding.  Musculoskeletal: Normal  range of motion. He exhibits tenderness. He exhibits no edema.  Lymphadenopathy:    He has no cervical adenopathy.  Neurological: He is alert and oriented to person, place, and time. He has normal reflexes. No cranial nerve deficit. He exhibits normal muscle tone. He displays a negative Romberg sign. Coordination abnormal. Gait normal.  Skin: Skin is warm and dry. No rash noted.  Psychiatric: He has a normal mood and affect. His behavior is normal. Judgment and thought content normal.    Lab Results  Component Value Date   WBC 5.5 03/20/2015   HGB 11.6* 03/20/2015   HCT 36.0* 03/20/2015   PLT 319.0 03/20/2015   GLUCOSE 154* 03/20/2015   CHOL 199 02/25/2014   TRIG 239* 02/25/2014   HDL 43 02/25/2014   LDLDIRECT 142.3 04/12/2010   LDLCALC 108* 02/25/2014   ALT 15 03/20/2015   AST 18 03/20/2015   NA 138 03/20/2015   K 4.1 03/20/2015   CL 103 03/20/2015   CREATININE 0.87 03/20/2015   BUN 8 03/20/2015   CO2 29 03/20/2015   TSH 0.46 12/12/2014   PSA 1.33 12/12/2014   HGBA1C 7.5* 03/20/2015    No results found.  Assessment & Plan:   Diagnoses and all orders for this visit:  Pain of upper abdomen -     CT Abdomen Pelvis W Contrast  Midline low back pain with sciatica, sciatica laterality unspecified -     CT Abdomen Pelvis W Contrast  Hemochromatosis, hereditary -     CT Abdomen Pelvis W Contrast  Other orders -     Discontinue: methadone (DOLOPHINE) 10 MG tablet; Take 2 tablets (20 mg total) by mouth 4 (four) times daily as needed for severe pain. For pain - FILL ON OR AFTER 03/20/2015 -     Discontinue: methadone (DOLOPHINE) 10 MG tablet; Take 2 tablets (20 mg total) by mouth 4 (four) times daily as needed for severe pain. For pain - FILL ON OR AFTER 04/19/2015 -     methadone (DOLOPHINE) 10 MG tablet; Take 2 tablets (20 mg total) by mouth 4 (four) times daily as needed for severe pain. For pain - FILL ON OR AFTER 05/20/2015 -     Discontinue: pantoprazole (PROTONIX) 40 MG  tablet; Take 1 tablet (40 mg total) by mouth daily. -     pantoprazole (PROTONIX) 40 MG tablet; Take 1 tablet (40 mg total) by mouth daily.  I have discontinued Mr. Louro ranitidine, methadone, and methadone. I have also changed his methadone. Additionally, I am having him maintain his ACCU-CHEK SOFTCLIX LANCETS, fexofenadine-pseudoephedrine, glucose blood, triamcinolone cream, Cholecalciferol, b complex vitamins, pseudoephedrine-guaifenesin, fluticasone, glimepiride, diazepam, RABEprazole, and pantoprazole.  Meds ordered this encounter  Medications  . DISCONTD: methadone (DOLOPHINE) 10 MG tablet    Sig: Take 2 tablets (20 mg total) by mouth 4 (four) times daily as needed for severe pain. For pain - FILL ON OR AFTER 03/20/2015    Dispense:  240 tablet  Refill:  0  . DISCONTD: methadone (DOLOPHINE) 10 MG tablet    Sig: Take 2 tablets (20 mg total) by mouth 4 (four) times daily as needed for severe pain. For pain - FILL ON OR AFTER 04/19/2015    Dispense:  240 tablet    Refill:  0  . methadone (DOLOPHINE) 10 MG tablet    Sig: Take 2 tablets (20 mg total) by mouth 4 (four) times daily as needed for severe pain. For pain - FILL ON OR AFTER 05/20/2015    Dispense:  240 tablet    Refill:  0  . DISCONTD: pantoprazole (PROTONIX) 40 MG tablet    Sig: Take 1 tablet (40 mg total) by mouth daily.    Dispense:  30 tablet    Refill:  3  . pantoprazole (PROTONIX) 40 MG tablet    Sig: Take 1 tablet (40 mg total) by mouth daily.    Dispense:  30 tablet    Refill:  3    Disregard this Rx please     Follow-up: Return in about 2 weeks (around 04/03/2015).  Walker Kehr, MD

## 2015-03-20 NOTE — Assessment & Plan Note (Signed)
Low Hgb today noted CT abd

## 2015-03-20 NOTE — Assessment & Plan Note (Signed)
8/16 x weeks; epigastric, mid-abd, LLQ H/o GB polyps, hemochromatosis - Dr Sharlett Iles, s/p a w/up a few years back Labs reviewed CT abd

## 2015-03-20 NOTE — Assessment & Plan Note (Signed)
On Methadone   Potential benefits of a long term opioids use as well as potential risks (i.e. addiction risk, apnea etc) and complications (i.e. Somnolence, constipation and others) were explained to the patient and were aknowledged. Benzo use risk is discussed

## 2015-03-26 ENCOUNTER — Inpatient Hospital Stay: Admission: RE | Admit: 2015-03-26 | Payer: Medicare Other | Source: Ambulatory Visit

## 2015-03-27 ENCOUNTER — Telehealth: Payer: Self-pay | Admitting: Internal Medicine

## 2015-03-27 ENCOUNTER — Ambulatory Visit (INDEPENDENT_AMBULATORY_CARE_PROVIDER_SITE_OTHER)
Admission: RE | Admit: 2015-03-27 | Discharge: 2015-03-27 | Disposition: A | Discharge status: Home / Self Care | Payer: Medicare Other | Source: Ambulatory Visit | Attending: Internal Medicine | Admitting: Internal Medicine

## 2015-03-27 DIAGNOSIS — M5440 Lumbago with sciatica, unspecified side: Secondary | ICD-10-CM | POA: Diagnosis not present

## 2015-03-27 DIAGNOSIS — R1010 Upper abdominal pain, unspecified: Secondary | ICD-10-CM | POA: Diagnosis not present

## 2015-03-27 DIAGNOSIS — R1033 Periumbilical pain: Secondary | ICD-10-CM

## 2015-03-27 MED ORDER — IOHEXOL 300 MG/ML  SOLN
100.0000 mL | Freq: Once | INTRAMUSCULAR | Status: AC | PRN
  Administered 2015-03-27: 100 mL via INTRAVENOUS

## 2015-03-27 MED ORDER — SULFAMETHOXAZOLE-TRIMETHOPRIM 800-160 MG PO TABS: 1.0000 | ORAL_TABLET | Freq: Two times a day (BID) | ORAL | Status: DC

## 2015-03-27 NOTE — Telephone Encounter (Signed)
Notified by [facility] of abnormal CT scan Colitis vs diverticulitis Spoke to patient--he has had 3 weeks of symptoms--pain anytime he eats No fever Bowels still moving  Will probably need further evaluation with colonoscopy Very sensitive to meds and allergic to PCN and cipro Will try empiric Rx with septra (no flagyl due to sensitive stomach) Remainder of plans per Dr Alain Marion

## 2015-03-30 NOTE — Telephone Encounter (Signed)
Noted. Agree - will sch a GI consult Thx

## 2015-03-30 NOTE — Addendum Note (Signed)
Addended by: Cassandria Anger on: 03/30/2015 05:02 PM   Modules accepted: Orders

## 2015-03-30 NOTE — Telephone Encounter (Signed)
Gabriel Erickson, please, inform patient that we are sch a GI consult. Cont w/abx that Dr Silvio Pate has rx'd Thx

## 2015-03-31 ENCOUNTER — Other Ambulatory Visit: Payer: Medicare Other

## 2015-03-31 NOTE — Telephone Encounter (Signed)
Pt aware.

## 2015-04-01 ENCOUNTER — Ambulatory Visit (INDEPENDENT_AMBULATORY_CARE_PROVIDER_SITE_OTHER): Payer: Medicare Other | Admitting: Gastroenterology

## 2015-04-01 ENCOUNTER — Encounter: Payer: Self-pay | Admitting: Gastroenterology

## 2015-04-01 ENCOUNTER — Ambulatory Visit: Payer: Medicare Other | Admitting: Internal Medicine

## 2015-04-01 VITALS — BP 144/76 | HR 80 | Ht 72.0 in | Wt 179.4 lb

## 2015-04-01 DIAGNOSIS — R938 Abnormal findings on diagnostic imaging of other specified body structures: Secondary | ICD-10-CM

## 2015-04-01 DIAGNOSIS — K219 Gastro-esophageal reflux disease without esophagitis: Secondary | ICD-10-CM | POA: Diagnosis not present

## 2015-04-01 DIAGNOSIS — R9389 Abnormal findings on diagnostic imaging of other specified body structures: Secondary | ICD-10-CM

## 2015-04-01 DIAGNOSIS — R109 Unspecified abdominal pain: Secondary | ICD-10-CM

## 2015-04-01 MED ORDER — RABEPRAZOLE SODIUM 20 MG PO TBEC: 20.0000 mg | DELAYED_RELEASE_TABLET | Freq: Two times a day (BID) | ORAL | Status: DC

## 2015-04-01 MED ORDER — NA SULFATE-K SULFATE-MG SULF 17.5-3.13-1.6 GM/177ML PO SOLN: 1.0000 | ORAL | Status: DC

## 2015-04-01 NOTE — Patient Instructions (Signed)

## 2015-04-01 NOTE — Progress Notes (Signed)
04/01/2015 Gabriel Erickson [id number] 1941/03/31   HISTORY OF PRESENT ILLNESS:  This is a 74 year old male who is previously known to Dr. Sharlett Iles for fatty liver and heterozygous state for hemachromatosis for which he was having phlebotomies performed for several years. Patient has not been seen in our office since 2010. Last colonoscopy was in May 2007 at which time he was found to have only diverticulosis; repeat was recommended in 10 years from that time.    He presents to our office today at the request of his PCP, Dr. Alain Marion, for evaluation regarding abdominal pain and an abnormal CT scan.  He tells me that for the past month or so he has had some abdominal pain that begins in his epigastrium and then goes down to his mid-abdomen.  He also complains of LLQ/groin pain that is worse with exercise such as mowing the lawn.  CT scan of the abdomen and pelvis with contrast was ordered by his PCP and was performed on September 9. This showed focal thickening of the colonic wall in the right mid colon with mild stranding of surrounding fat suspicious for focal colitis versus diverticulitis versus a neoplastic process. They're recommending follow-up colonoscopy to exclude colonic malignancy. There is also a mesenteric lymph node just medial to the right colon. Colonic diverticuli noted in the sigmoid colon but no evidence of diverticulitis there. He has been started on Bactrim empirically since he is allergic to Cipro and penicillin.  Has chronic constipation for which he takes a stool softener.  Denies seeing red blood in his stools but stools are dark in color intermittently.  He's had problems with GERD on and off for several years. Had been on AcipHex in the past with good relief of his symptoms, but discontinued that medication because he was feeling so well.  Recently, his symptoms returned and he was given pantoprazole by his PCP, however, he only took it for a couple of days and said it did not  work so it was changed back to the Aciphex 20 mg daily by his PCP.  He has now only been on that again since about 9/2 and is already saying that it does not seem to be helping.  Also, recently his hemoglobin has been slightly low with 11.6 g on the last labs just 2 weeks ago. This is compared to his previous hemoglobins of 14-16 g and requiring phlebotomies in the past.   Past Medical History  Diagnosis Date  . Fatty liver   . Gallstones     Possible vs sludge  . Hypotension   . Diabetes mellitus   . Diverticulosis of colon   . GERD (gastroesophageal reflux disease)   . Hypogonadism male   . LBP (low back pain)   . Osteoarthritis   . Peripheral neuropathy   . Constipation    Past Surgical History  Procedure Laterality Date  . Lumbar laminectomy    . Appendectomy    . Foot surgery  1966    Left  . Tonsillectomy    . Hemorrhoid surgery  2011    [facility]    reports that he has never smoked. He has never used smokeless tobacco. He reports that he does not drink alcohol or use illicit drugs. family history includes Cancer in his sister; Heart disease in his mother; Hemochromatosis in his sister. Allergies  Allergen Reactions  . Ciprofloxacin     REACTION: ? throat discomfort  . Influenza Vaccines Nausea And Vomiting  .  Metformin And Related     n/v  . Penicillins   . Rosuvastatin       Outpatient Encounter Prescriptions as of 04/01/2015  Medication Sig  . ACCU-CHEK SOFTCLIX LANCETS lancets by Other route 2 (two) times daily. Use as instructed   . b complex vitamins tablet Take 1 tablet by mouth daily.    . Cholecalciferol 1000 UNITS tablet Take 1,000 Units by mouth daily.    . diazepam (VALIUM) 5 MG tablet Take 1 tablet (5 mg total) by mouth 2 (two) times daily as needed for muscle spasms (spasms). For cramps or insomnia  . fluticasone (FLONASE) 50 MCG/ACT nasal spray Place 2 sprays into the nose daily.  Marland Kitchen glimepiride (AMARYL) 4 MG tablet Take 1 tablet (4 mg total) by  mouth 2 (two) times daily.  Marland Kitchen glucose blood (ONE TOUCH ULTRA TEST) test strip 1 each by Other route 2 (two) times daily. Dx 250.00 fluctuating blood sugar   . methadone (DOLOPHINE) 10 MG tablet Take 2 tablets (20 mg total) by mouth 4 (four) times daily as needed for severe pain. For pain - FILL ON OR AFTER 05/20/2015  . pseudoephedrine-guaifenesin (MUCINEX D) 60-600 MG per tablet Take 1 tablet by mouth every 12 (twelve) hours.  . RABEprazole (ACIPHEX) 20 MG tablet Take 1 tablet (20 mg total) by mouth 2 (two) times daily.  Marland Kitchen sulfamethoxazole-trimethoprim (BACTRIM DS,SEPTRA DS) 800-160 MG per tablet Take 1 tablet by mouth 2 (two) times daily.  Marland Kitchen triamcinolone (KENALOG) 0.5 % cream Apply topically 2 (two) times daily as needed.    . [DISCONTINUED] fexofenadine-pseudoephedrine (ALLEGRA-D 24) 180-240 MG per 24 hr tablet Take 1 tablet by mouth daily. As needed for allergies   . [DISCONTINUED] pantoprazole (PROTONIX) 40 MG tablet Take 1 tablet (40 mg total) by mouth daily.  . [DISCONTINUED] RABEprazole (ACIPHEX) 20 MG tablet Take 1 tablet (20 mg total) by mouth daily.  . Na Sulfate-K Sulfate-Mg Sulf (SUPREP BOWEL PREP) SOLN Take 1 kit by mouth as directed.   No facility-administered encounter medications on file as of 04/01/2015.     REVIEW OF SYSTEMS  : All other systems reviewed and negative except where noted in the History of Present Illness.   PHYSICAL EXAM: BP 144/76 mmHg  Pulse 80  Ht 6' (1.829 m)  Wt 179 lb 6.4 oz (81.375 kg)  BMI 24.33 kg/m2 General: Well developed white male in no acute distress Head: Normocephalic and atraumatic Eyes:  Sclerae anicteric, conjunctiva pink. Ears: Normal auditory acuity Lungs: Clear throughout to auscultation Heart: Regular rate and rhythm Abdomen: Soft, non-distended.  Normal bowel sounds.  Moderate LLQ/left groin TTP. Rectal:  Will be done at the time of colonoscopy. Musculoskeletal: Symmetrical with no gross deformities  Skin: No lesions on  visible extremities Extremities: No edema  Neurological: Alert oriented x 4, grossly non-focal Psychological:  Alert and cooperative. Normal mood and affect  ASSESSMENT AND PLAN: -74 year old male with abnormal CT scan showing right-sided focal colitis vs diverticulitis vs neoplasm and complaints of abdominal pain.  Tenderness actually in LLQ/left groin.  On Bactrim since he is allergic to cipro and PCN's.  Will complete that course.  Needs colonoscopy for evaluation since last was over 9 years ago.  Will schedule for Dr. Havery Moros at patient's request.   -GERD, chronic but worse recently and post-prandial epigastric pain:  Will schedule for EGD as well.  He is on Aciphex, which he will continue, but increase to twice daily for now (will give new prescription).  *  The risks, benefits, and alternatives to EGD and colonoscopy were discussed with the patient and he consents to proceed.   CC:  Plotnikov, Evie Lacks, MD

## 2015-04-03 NOTE — Progress Notes (Signed)
I agree with assessment and plan as outlined.  

## 2015-04-18 HISTORY — PX: COLONOSCOPY: SHX174

## 2015-04-20 ENCOUNTER — Other Ambulatory Visit: Payer: Self-pay | Admitting: Internal Medicine

## 2015-05-07 ENCOUNTER — Ambulatory Visit (AMBULATORY_SURGERY_CENTER): Payer: Medicare Other | Admitting: Gastroenterology

## 2015-05-07 ENCOUNTER — Encounter: Payer: Self-pay | Admitting: Gastroenterology

## 2015-05-07 VITALS — BP 139/72 | HR 65 | Temp 98.8°F | Resp 24 | Ht 72.0 in | Wt 179.0 lb

## 2015-05-07 DIAGNOSIS — K6389 Other specified diseases of intestine: Secondary | ICD-10-CM | POA: Diagnosis not present

## 2015-05-07 DIAGNOSIS — R1010 Upper abdominal pain, unspecified: Secondary | ICD-10-CM

## 2015-05-07 DIAGNOSIS — R938 Abnormal findings on diagnostic imaging of other specified body structures: Secondary | ICD-10-CM

## 2015-05-07 DIAGNOSIS — K621 Rectal polyp: Secondary | ICD-10-CM | POA: Diagnosis not present

## 2015-05-07 DIAGNOSIS — R1013 Epigastric pain: Secondary | ICD-10-CM

## 2015-05-07 DIAGNOSIS — D123 Benign neoplasm of transverse colon: Secondary | ICD-10-CM | POA: Diagnosis not present

## 2015-05-07 DIAGNOSIS — D649 Anemia, unspecified: Secondary | ICD-10-CM

## 2015-05-07 DIAGNOSIS — R9389 Abnormal findings on diagnostic imaging of other specified body structures: Secondary | ICD-10-CM

## 2015-05-07 DIAGNOSIS — D128 Benign neoplasm of rectum: Secondary | ICD-10-CM

## 2015-05-07 DIAGNOSIS — K2100 Gastro-esophageal reflux disease with esophagitis, without bleeding: Secondary | ICD-10-CM

## 2015-05-07 DIAGNOSIS — D129 Benign neoplasm of anus and anal canal: Secondary | ICD-10-CM

## 2015-05-07 LAB — GLUCOSE, CAPILLARY
GLUCOSE-CAPILLARY: 90 mg/dL (ref 65–99)
Glucose-Capillary: 98 mg/dL (ref 65–99)

## 2015-05-07 MED ORDER — SODIUM CHLORIDE 0.9 % IV SOLN: 500.0000 mL | INTRAVENOUS | Status: DC

## 2015-05-07 MED ORDER — POLYETHYLENE GLYCOL 3350 17 GM/SCOOP PO POWD: 17.0000 g | Freq: Three times a day (TID) | ORAL | Status: DC

## 2015-05-07 MED ORDER — FERROUS SULFATE 325 (65 FE) MG PO TABS: 325.0000 mg | ORAL_TABLET | Freq: Every day | ORAL | Status: DC

## 2015-05-07 NOTE — Patient Instructions (Signed)
YOU HAD AN ENDOSCOPIC PROCEDURE TODAY AT THE [facility]:   Refer to the procedure report that was given to you for any specific questions about what was found during the examination.  If the procedure report does not answer your questions, please call your gastroenterologist to clarify.  If you requested that your care partner not be given the details of your procedure findings, then the procedure report has been included in a sealed envelope for you to review at your convenience later.  YOU SHOULD EXPECT: Some feelings of bloating in the abdomen. Passage of more gas than usual.  Walking can help get rid of the air that was put into your GI tract during the procedure and reduce the bloating. If you had a lower endoscopy (such as a colonoscopy or flexible sigmoidoscopy) you may notice spotting of blood in your stool or on the toilet paper. If you underwent a bowel prep for your procedure, you may not have a normal bowel movement for a few days.  Please Note:  You might notice some irritation and congestion in your nose or some drainage.  This is from the oxygen used during your procedure.  There is no need for concern and it should clear up in a day or so.  SYMPTOMS TO REPORT IMMEDIATELY:   Following lower endoscopy (colonoscopy or flexible sigmoidoscopy):  Excessive amounts of blood in the stool  Significant tenderness or worsening of abdominal pains  Swelling of the abdomen that is new, acute  Fever of 100F or higher   Following upper endoscopy (EGD)  Vomiting of blood or coffee ground material  New chest pain or pain under the shoulder blades  Painful or persistently difficult swallowing  New shortness of breath  Fever of 100F or higher  Black, tarry-looking stools  For urgent or emergent issues, a gastroenterologist can be reached at any hour by calling [phone].   DIET: Your first meal following the procedure should be a small meal and then it is ok to progress to  your normal diet. Heavy or fried foods are harder to digest and may make you feel nauseous or bloated.  Likewise, meals heavy in dairy and vegetables can increase bloating.  Drink plenty of fluids but you should avoid alcoholic beverages for 24 hours.  ACTIVITY:  You should plan to take it easy for the rest of today and you should NOT DRIVE or use heavy machinery until tomorrow (because of the sedation medicines used during the test).    FOLLOW UP: Our staff will call the number listed on your records the next business day following your procedure to check on you and address any questions or concerns that you may have regarding the information given to you following your procedure. If we do not reach you, we will leave a message.  However, if you are feeling well and you are not experiencing any problems, there is no need to return our call.  We will assume that you have returned to your regular daily activities without incident.  If any biopsies were taken you will be contacted by phone or by letter within the next 1-3 weeks.  Please call us at [phone] if you have not heard about the biopsies in 3 weeks.    SIGNATURES/CONFIDENTIALITY: You and/or your care partner have signed paperwork which will be entered into your electronic medical record.  These signatures attest to the fact that that the information above on your After Visit Summary has been reviewed   and is understood.  Full responsibility of the confidentiality of this discharge information lies with you and/or your care-partner.   Await pathology results  Recommended by Dr Havery Moros- miralax 2-3 doses as directed per day to keep stools loose     Recommend also taking ferrous Sulfate 325 mg daily for anemia /Iron loss  Resume diet   Resume medications

## 2015-05-07 NOTE — Progress Notes (Signed)
Transferred to recovery room. A/O x3, pleased with MAC.  VSS.  Report to Penny, RN. 

## 2015-05-07 NOTE — Progress Notes (Signed)
Called to room to assist during endoscopic procedure.  Patient ID and intended procedure confirmed with present staff. Received instructions for my participation in the procedure from the performing physician.  

## 2015-05-07 NOTE — Op Note (Signed)
[facility]  [address]. [place], [id number]   ENDOSCOPY PROCEDURE REPORT  PATIENT: Gabriel Erickson, Gabriel Erickson  MR#: [id number] BIRTHDATE: [birth year] , 38  yrs. old GENDER: male ENDOSCOPIST: Yetta Flock, MD REFERRED BY: PROCEDURE DATE:  05/07/2015 PROCEDURE:  EGD w/ biopsy ASA CLASS:     Class II INDICATIONS:  epigastric pain and anemia. MEDICATIONS: Propofol 150 mg IV TOPICAL ANESTHETIC:  DESCRIPTION OF PROCEDURE: After the risks benefits and alternatives of the procedure were thoroughly explained, informed consent was obtained.  The [id number] endoscope was introduced through the mouth and advanced to the second portion of the duodenum , Without limitations.  The instrument was slowly withdrawn as the mucosa was fully examined.  FINDINGS:The esophagus was normal in appearance.  GEJ, SCJ and DH located 39cm from the incisors.  The stomach had some patchy erythema in the gastric body and fundus, without focal ulceration or erosion.  Biopsies taken to rule out H pylori.  The duodenal bulb and 2nd portion of the duodenum were otherwise normal. Retroflexed views revealed no abnormalities.     The scope was then withdrawn from the patient and the procedure completed. COMPLICATIONS: There were no immediate complications.  ENDOSCOPIC IMPRESSION: Normal esophagus Gastric erythema, biopsies taken to rule out H pylori Normal duodenum  RECOMMENDATIONS: Await pathology results Resume diet Resume medications  eSigned:  Yetta Flock, MD 05/07/2015 1:53 PM    CC: the patient  PATIENT NAME:  Cornelious, Bartolucci MR#: [id number]

## 2015-05-07 NOTE — Progress Notes (Signed)
Message left for [facility] by Doristine Bosworth RN for surgery consult to be made for Mr. Cottam as ordered by dr Havery Moros.

## 2015-05-07 NOTE — Op Note (Signed)
[place]  [address]. [place], [address]   COLONOSCOPY PROCEDURE REPORT  PATIENT: Corry, Ihnen  MR#: [id number] BIRTHDATE: [birth year] , 65  yrs. old GENDER: male ENDOSCOPIST: Yetta Flock, MD REFERRED BY: Dr. Alain Marion PROCEDURE DATE:  05/07/2015 PROCEDURE:   Colonoscopy, diagnostic, Colonoscopy with biopsy, and Submucosal injection, any substance First Screening Colonoscopy - Avg.  risk and is 50 yrs.  old or older - No.  Prior Negative Screening - Now for repeat screening. Less than 10 yrs Prior Negative Screening - Now for repeat screening.  Other: See Comments  History of Adenoma - Now for follow-up colonoscopy & has been > or = to 3 yrs.  N/A  Polyps removed today? Yes ASA CLASS:   Class II INDICATIONS:Evaluation of barium enema or other imaging study of an abnormality that is likely to be clinically significant, such as filling defect or stricture and Colorectal Neoplasm Risk Assessment for this procedure is average risk. MEDICATIONS: Propofol 175 mg IV  DESCRIPTION OF PROCEDURE:   After the risks benefits and alternatives of the procedure were thoroughly explained, informed consent was obtained.  The digital rectal exam revealed no abnormalities of the rectum.   The LB [id number]  endoscope was introduced through the anus and advanced to the The extent of the exam was the ascending colon where a large fungating mass was noted and could not be traversed. No adverse events experienced. The quality of the prep was adequate  The instrument was then slowly withdrawn as the colon was fully examined. Estimated blood loss is zero unless otherwise noted in this procedure report.  COLON FINDINGS: The extent of the exam was the ascending colon where a large fungating mass was noted.  The mass was friable with stigmata of recent bleeding, and obliterated the lumen.  The dimentions of it could not be measured.  The endoscope could not traverse it.   Multiple biopsies were obtained and a tattoo was placed at the inferior margin of it.  A 46mm sessile transverse polyp was noted and removed with cold forceps.  A 3-18mm sessile rectal polyp was noted c/w hyperplastic polyp and removed with cold forceps.  There was severe diverticulosis noted in the left colon. The remainder of the examined colon was normal.  Retroflexed views revealed internal hemorrhoids. The time to cecum = 4.9 Withdrawal time = 16.1   The scope was withdrawn and the procedure completed. COMPLICATIONS: There were no immediate complications.     ENDOSCOPIC IMPRESSION: Large  ascending colon mass, concerning for malignancy, with narrowing of the colon lumen. Multiple biopsies obtained and tattoo placed. I suspect this is the cause of the patient's symptoms Two benign appearing colon polyps, removed with cold forceps Large internal hemorrhoids  RECOMMENDATIONS: Await pathology results General surgery consultation to be evaluated for resection of mass Further imaging and labs pending biopsy results (recent CT abdomen on file) Resume diet Resume medications Recommend miralax 2-3 doses per day to keep stools loose and prevent obstruction Recommend ferrous sulfate 325mg  daily for anemia / iron loss  eSigned:  Yetta Flock, MD 05/07/2015 1:49 PM   cc: Dr. Alain Marion, the patient   PATIENT NAME:  Aurther, Harlin MR#: [id number]

## 2015-05-08 ENCOUNTER — Telehealth: Payer: Self-pay

## 2015-05-08 ENCOUNTER — Telehealth: Payer: Self-pay | Admitting: *Deleted

## 2015-05-08 NOTE — Telephone Encounter (Signed)
Pathology resulted. Appointment moved to 05/11/15. Information given to Dr Havery Moros.

## 2015-05-08 NOTE — Telephone Encounter (Signed)
  Follow up Call-  Call back number 05/07/2015  Post procedure Call Back phone  # [phone]  Permission to leave phone message Yes     Patient questions:  Do you have a fever, pain , or abdominal swelling? No. Pain Score  0 *  Have you tolerated food without any problems? Yes.    Have you been able to return to your normal activities? Yes.    Do you have any questions about your discharge instructions: Diet   No. Medications  No. Follow up visit  No.  Do you have questions or concerns about your Care? No.  Actions: * If pain score is 4 or above: No action needed, pain <4.  Pt was upset that his appointment wasn't scheduled sooner, he states he was "having problems and could not go any longer", he was originally scheduled 2 1/2 months out from his office appointment, he was very upset about the untimeliness of being scheduled because he already knew he had something wrong from the CT scan, he wanted to make sure we had some kind of expedite process for pt that were having issues, he states he should not have had to wait that long to get a colonoscopy with the issues he was having, I advised pt that I would let Dr Havery Moros know of his concern-Thanks ADM

## 2015-05-08 NOTE — Telephone Encounter (Signed)
Pathology from the colonoscopy is pending. An appointment is scheduled with Dr Zella Richer at [facility] on 05/21/15 arrive at 3:45 pm. The patient is notified.

## 2015-05-08 NOTE — Telephone Encounter (Signed)
Spoke with patient and answered his questions. Agree that I wish this has been done sooner than it was, unclear why his case took as long as it did to be scheduled although due to loss of providers from the practice in the past 2 months wait time for endoscopic procedures had increased. I assured him we will move quickly to treat his condition. I have placed a surgical consult and will have this expedited ASAP. I am waiting on pathology results which was sent to be read as soon as possible and will get back to him shortly. He agreed.

## 2015-05-11 ENCOUNTER — Other Ambulatory Visit (INDEPENDENT_AMBULATORY_CARE_PROVIDER_SITE_OTHER): Payer: Medicare Other

## 2015-05-11 ENCOUNTER — Ambulatory Visit: Payer: Self-pay | Admitting: Surgery

## 2015-05-11 ENCOUNTER — Other Ambulatory Visit: Payer: Self-pay

## 2015-05-11 DIAGNOSIS — Z01812 Encounter for preprocedural laboratory examination: Secondary | ICD-10-CM

## 2015-05-11 DIAGNOSIS — C182 Malignant neoplasm of ascending colon: Secondary | ICD-10-CM

## 2015-05-11 DIAGNOSIS — C189 Malignant neoplasm of colon, unspecified: Secondary | ICD-10-CM

## 2015-05-11 LAB — CREATININE, SERUM: Creatinine, Ser: 0.82 mg/dL (ref 0.40–1.50)

## 2015-05-11 LAB — BUN: BUN: 9 mg/dL (ref 6–23)

## 2015-05-11 NOTE — H&P (Signed)
Gabriel Erickson 05/11/2015 9:32 AM Location: [facility] Patient #: [id number] DOB: 10-16-1940 Married / Language: English / Race: White Male  History of Present Illness Gabriel Moores A. Travares Nelles MD; 05/11/2015 1:07 PM) Patient words: Patient sent at the request of Dr. Havery Moros for a descending colon cancer. The patient had abdominal bloating, excessive gas excessive abdominal fullness after meals. A CT scan was done which showed some thickening of the ascending colon and a subsequent colonoscopy was done which showed a partially obstructing ascending colon mass core biopsy proven to be adenocarcinoma. He had some benign polyps removed from his transverse colon. He denies any blood in his stool. He is able to eat and drink liquids and does have bowel movements but does suffer from bouts of constipation. No vomiting.              CLINICAL DATA: Postprandial epigastric pain, lower abdominal pain EXAM: CT ABDOMEN AND PELVIS WITH CONTRAST TECHNIQUE: Multidetector CT imaging of the abdomen and pelvis was performed using the standard protocol following bolus administration of intravenous contrast. CONTRAST: 124mL OMNIPAQUE IOHEXOL 300 MG/ML SOLN COMPARISON: 07/22/2008 FINDINGS: Lung bases are unremarkable. Sagittal images of the spine shows degenerative changes thoracolumbar spine. Again noted fatty infiltration of the liver. No calcified gallstones are noted within gallbladder. 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 No focal pancreatic mass. The spleen and adrenal glands are unremarkable. Abdominal aorta is unremarkable. Kidneys are symmetrical in size and enhancement. No hydronephrosis or hydroureter. There is no small bowel obstruction. No ascites or free air. Moderate stool noted in transverse colon. There is no pericecal inflammation. A low lying cecum is noted. The patient is status post appendectomy. In axial  image 44 there is focal thickening of colonic wall in mid right colon. Mild stranding of surrounding fat. Findings could be due to focal colitis. A neoplastic process at this level cannot be excluded. Follow-up colonoscopy is recommended to exclude colonic malignancy. There is a mesenteric lymph node just medial to right colon axial image 42 measures 7 mm. Adenopathy cannot be excluded. Colonic diverticula are noted in sigmoid colon. No evidence of acute diverticulitis. Mild enlarged prostate gland with indentation of urinary bladder base. IMPRESSION: 1. In axial image 41 and 44 there is focal thickening of colonic wall in mid right colon. Mild stranding of surrounding fat. Findings are suspicious for focal colitis or diverticulitis. A neoplastic process at this level cannot be excluded. Follow-up colonoscopy is recommended to exclude colonic malignancy. There is a mesenteric lymph node just medial to right colon axial image 42 measures 7 mm. Adenopathy cannot be excluded. Best seen in coronal image 63. 2. Mild enlarged prostate gland with indentation of urinary bladder base. 3. There is a low lying cecum. No pericecal inflammation. Status post appendectomy. 4. No small bowel obstruction. 5. Degenerative changes thoracolumbar spine. 6. Colonic diverticula are noted sigmoid colon. No evidence of acute diverticulitis. Electronically Signed By: Lahoma Crocker M.D. On: 03/27/2015 17:03      Diagnosis 1. Stomach, biopsy, gastric anturm and body - REACTIVE GASTROPATHY (ANTRAL MUCOSA) - WARTHIN STARRY STAIN NEGATIVE FOR H. PYLORI 2. Colon, biopsy, ascending - INVASIVE ADENOCARCINOMA, SEE COMMENT. 3. Colon, biopsy, transverse, rectal, polyp(2) - SESSILE SERRATED POLYP (X1); NEGATIVE FOR CYTOLOGIC DYSPLASIA. - TUBULAR ADENOMA (X1); NEGATIVE FOR HIGH GRADE DYSPLASIA OR MALIGNANCY. Microscopic Comment 2. The adenocarcinoma is associated with low and high grade adenomatous dysplasia. This part was  reviewed by Dr. Avis Epley who concurs. The case was discussed  with Dr. Havery Moros on 05/08/2015. Mali RUND DO Pathologist, [employer] (Case.  The patient is a 74 year old male.   Other Problems Elbert Ewings, CMA; 05/11/2015 9:32 AM) Arthritis Back Pain Colon Cancer Diabetes Mellitus Diverticulosis Enlarged Prostate Gastroesophageal Reflux Disease Hemorrhoids  Past Surgical History Elbert Ewings, CMA; 05/11/2015 9:32 AM) Appendectomy Colon Polyp Removal - Colonoscopy Colon Polyp Removal - Open Hemorrhoidectomy Spinal Surgery - Lower Back  Allergies Elbert Ewings, CMA; 05/11/2015 9:34 AM) Ciprofloxacin *CHEMICALS* Influenza Vac Split High-Dose *VACCINES* MetFORMIN HCl *CHEMICALS* Penicillin V *PENICILLINS* Rosuvastatin Calcium *ANTIHYPERLIPIDEMICS*  Medication History Elbert Ewings, CMA; 05/11/2015 9:37 AM) Glimepiride (4MG  Tablet, Oral) Active. RABEprazole Sodium (20MG  Tablet DR, Oral) Active. Sulfamethoxazole-Trimethoprim (800-160MG  Tablet, Oral) Active. Dolophine (10MG  Tablet, Oral) Active. Triamcinolone Acetonide (0.5% Cream, External) Active. MiraLax (Oral) Active. Cholecalciferol (1000UNIT Capsule, Oral) Active. Valium (5MG  Tablet, Oral) Active. Ferrous Sulfate (325 (65 Fe)MG Tablet DR, Oral) Active. Glucose Monitoring Supplies Active. Medications Reconciled  Social History Elbert Ewings, [place]; 05/11/2015 9:32 AM) Caffeine use Carbonated beverages, Coffee. No drug use Tobacco use Never smoker.  Family History Elbert Ewings, [place]; 05/11/2015 9:32 AM) Cancer Sister. Diabetes Mellitus Father. Hypertension Mother. Ovarian Cancer Sister.     Review of Systems Elbert Ewings CMA; 05/11/2015 9:32 AM) General Present- Appetite Loss, Fatigue and Weight Loss. Not Present- Chills, Fever, Night Sweats and Weight Gain. Skin Present- Dryness and Non-Healing Wounds. Not Present- Change in Wart/Mole, Hives, Jaundice, New Lesions,  Rash and Ulcer. HEENT Present- Hearing Loss, Seasonal Allergies and Wears glasses/contact lenses. Not Present- Earache, Hoarseness, Nose Bleed, Oral Ulcers, Ringing in the Ears, Sinus Pain, Sore Throat, Visual Disturbances and Yellow Eyes. Respiratory Present- Snoring. Not Present- Bloody sputum, Chronic Cough, Difficulty Breathing and Wheezing. Cardiovascular Present- Leg Cramps and Shortness of Breath. Not Present- Chest Pain, Difficulty Breathing Lying Down, Palpitations, Rapid Heart Rate and Swelling of Extremities. Gastrointestinal Present- Abdominal Pain, Bloating, Bloody Stool, Change in Bowel Habits, Constipation, Excessive gas, Hemorrhoids, Indigestion, Nausea and Rectal Pain. Not Present- Chronic diarrhea, Difficulty Swallowing, Gets full quickly at meals and Vomiting. Male Genitourinary Present- Frequency and Nocturia. Not Present- Blood in Urine, Change in Urinary Stream, Impotence, Painful Urination, Urgency and Urine Leakage. Musculoskeletal Present- Back Pain, Joint Pain, Joint Stiffness and Muscle Pain. Not Present- Muscle Weakness and Swelling of Extremities. Neurological Present- Headaches, Tingling, Trouble walking and Weakness. Not Present- Decreased Memory, Fainting, Numbness, Seizures and Tremor. Psychiatric Present- Change in Sleep Pattern. Not Present- Anxiety, Bipolar, Depression, Fearful and Frequent crying. Hematology Present- Easy Bruising. Not Present- Excessive bleeding, Gland problems, HIV and Persistent Infections.  Vitals Elbert Ewings CMA; 05/11/2015 9:37 AM) 05/11/2015 9:37 AM Weight: 176 lb Height: 72in Body Surface Area: 2.02 m Body Mass Index: 23.87 kg/m  Temp.: 97.34F(Temporal)  Pulse: 60 (Regular)  BP: 130/72 (Sitting, Left Arm, Standard)      Physical Exam (Gabriel Erickson A. Wanza Szumski MD; 05/11/2015 1:07 PM)  General Mental Status-Alert. General Appearance-Consistent with stated age. Hydration-Well hydrated. Voice-Normal.  Head and  Neck Head-normocephalic, atraumatic with no lesions or palpable masses. Trachea-midline. Thyroid Gland Characteristics - normal size and consistency.  Eye Eyeball - Bilateral-Extraocular movements intact. Sclera/Conjunctiva - Bilateral-No scleral icterus.  Chest and Lung Exam Chest and lung exam reveals -quiet, even and easy respiratory effort with no use of accessory muscles and on auscultation, normal breath sounds, no adventitious sounds and normal vocal resonance. Inspection Chest Wall - Normal. Back - normal.  Cardiovascular Cardiovascular examination reveals -normal heart sounds, regular rate and rhythm with no murmurs and normal pedal pulses  bilaterally.  Abdomen Note: Soft nontender nondistended no rebound or guarding. Appendectomy scar noted.  Neurologic Neurologic evaluation reveals -alert and oriented x 3 with no impairment of recent or remote memory. Mental Status-Normal.  Musculoskeletal Normal Exam - Left-Upper Extremity Strength Normal and Lower Extremity Strength Normal. Normal Exam - Right-Upper Extremity Strength Normal and Lower Extremity Strength Normal.    Assessment & Plan (Raphael Fitzpatrick A. 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 Risk of partial colon resection include bleeding, infection, leak of anastamosis, death, colostomy, organ injury, kidney injury, ureter injury, bladder injury, SBO, and need for other surgery. Pt agrees to proceed.  Current Plans Pt Education - [facility] Colorectal Cancer (AT): discussed with patient and provided information. Pt Education - [facility] Laparosopic Post Op HCI (Gross) Pt Education - [facility] Colon Bowel Prep 2015 Miralax/Antibiotics Pt Education - [facility] Colectomy post-op instructions: discussed with patient and provided information. Pt Education - [facility] Laparoscopic Surgery HCI The  anatomy & physiology of the digestive tract was discussed. The pathophysiology of the colon was discussed. Natural history risks without surgery was discussed. I feel the risks of no intervention will lead to serious problems that outweigh the operative risks; therefore, I recommended a partial colectomy to remove the pathology. Minimally invasive (Robotic/Laparoscopic) & open techniques were discussed.  Risks such as bleeding, infection, abscess, leak, reoperation, possible ostomy, hernia, heart attack, death, and other risks were discussed. I noted a good likelihood this will help address the problem. Goals of post-operative recovery were discussed as well. Need for adequate nutrition, daily bowel regimen and healthy physical activity, to optimize recovery was noted as well. We will work to minimize complications. Educational materials were available as well. Questions were answered. The patient expresses understanding & wishes to proceed with surgery.  Pt Education - Patient education: Colon and rectal cancer (Beyond the Basics): discussed with patient and provided information.

## 2015-05-12 LAB — CEA: CEA: 1.6 ng/mL (ref 0.0–5.0)

## 2015-05-15 ENCOUNTER — Ambulatory Visit (INDEPENDENT_AMBULATORY_CARE_PROVIDER_SITE_OTHER)
Admission: RE | Admit: 2015-05-15 | Discharge: 2015-05-15 | Disposition: A | Discharge status: Home / Self Care | Payer: Medicare Other | Source: Ambulatory Visit | Attending: Gastroenterology | Admitting: Gastroenterology

## 2015-05-15 DIAGNOSIS — C189 Malignant neoplasm of colon, unspecified: Secondary | ICD-10-CM | POA: Diagnosis not present

## 2015-05-15 MED ORDER — IOHEXOL 300 MG/ML  SOLN
80.0000 mL | Freq: Once | INTRAMUSCULAR | Status: AC | PRN
  Administered 2015-05-15: 80 mL via INTRAVENOUS

## 2015-05-19 ENCOUNTER — Other Ambulatory Visit: Payer: Self-pay | Admitting: *Deleted

## 2015-05-19 DIAGNOSIS — E041 Nontoxic single thyroid nodule: Secondary | ICD-10-CM

## 2015-06-03 ENCOUNTER — Ambulatory Visit (INDEPENDENT_AMBULATORY_CARE_PROVIDER_SITE_OTHER): Payer: Medicare Other | Admitting: Internal Medicine

## 2015-06-03 ENCOUNTER — Other Ambulatory Visit (INDEPENDENT_AMBULATORY_CARE_PROVIDER_SITE_OTHER): Payer: Medicare Other

## 2015-06-03 ENCOUNTER — Encounter: Payer: Self-pay | Admitting: Internal Medicine

## 2015-06-03 VITALS — BP 128/72 | HR 94 | Ht 72.0 in | Wt 177.0 lb

## 2015-06-03 DIAGNOSIS — M545 Low back pain, unspecified: Secondary | ICD-10-CM

## 2015-06-03 DIAGNOSIS — E1142 Type 2 diabetes mellitus with diabetic polyneuropathy: Secondary | ICD-10-CM | POA: Diagnosis not present

## 2015-06-03 DIAGNOSIS — E041 Nontoxic single thyroid nodule: Secondary | ICD-10-CM | POA: Diagnosis not present

## 2015-06-03 DIAGNOSIS — E119 Type 2 diabetes mellitus without complications: Secondary | ICD-10-CM

## 2015-06-03 DIAGNOSIS — M5440 Lumbago with sciatica, unspecified side: Secondary | ICD-10-CM | POA: Diagnosis not present

## 2015-06-03 DIAGNOSIS — Z0000 Encounter for general adult medical examination without abnormal findings: Secondary | ICD-10-CM | POA: Diagnosis not present

## 2015-06-03 DIAGNOSIS — Z85038 Personal history of other malignant neoplasm of large intestine: Secondary | ICD-10-CM | POA: Insufficient documentation

## 2015-06-03 DIAGNOSIS — G8929 Other chronic pain: Secondary | ICD-10-CM

## 2015-06-03 DIAGNOSIS — C189 Malignant neoplasm of colon, unspecified: Secondary | ICD-10-CM | POA: Insufficient documentation

## 2015-06-03 DIAGNOSIS — G609 Hereditary and idiopathic neuropathy, unspecified: Secondary | ICD-10-CM

## 2015-06-03 DIAGNOSIS — C182 Malignant neoplasm of ascending colon: Secondary | ICD-10-CM

## 2015-06-03 LAB — CBC WITH DIFFERENTIAL/PLATELET
BASOS ABS: 0.1 10*3/uL (ref 0.0–0.1)
Basophils Relative: 1.6 % (ref 0.0–3.0)
EOS ABS: 0.1 10*3/uL (ref 0.0–0.7)
Eosinophils Relative: 1.5 % (ref 0.0–5.0)
HCT: 30.3 % — ABNORMAL LOW (ref 39.0–52.0)
Hemoglobin: 9.2 g/dL — ABNORMAL LOW (ref 13.0–17.0)
LYMPHS PCT: 26.5 % (ref 12.0–46.0)
Lymphs Abs: 1.6 10*3/uL (ref 0.7–4.0)
MCHC: 30.5 g/dL (ref 30.0–36.0)
MCV: 69.6 fl — ABNORMAL LOW (ref 78.0–100.0)
MONOS PCT: 12.4 % — AB (ref 3.0–12.0)
Monocytes Absolute: 0.8 10*3/uL (ref 0.1–1.0)
NEUTROS ABS: 3.5 10*3/uL (ref 1.4–7.7)
Neutrophils Relative %: 58 % (ref 43.0–77.0)
PLATELETS: 405 10*3/uL — AB (ref 150.0–400.0)
RBC: 4.35 Mil/uL (ref 4.22–5.81)
RDW: 16.2 % — ABNORMAL HIGH (ref 11.5–15.5)
WBC: 6.1 10*3/uL (ref 4.0–10.5)

## 2015-06-03 LAB — HEPATIC FUNCTION PANEL
ALBUMIN: 4 g/dL (ref 3.5–5.2)
ALT: 15 U/L (ref 0–53)
AST: 20 U/L (ref 0–37)
Alkaline Phosphatase: 85 U/L (ref 39–117)
Bilirubin, Direct: 0.1 mg/dL (ref 0.0–0.3)
TOTAL PROTEIN: 7.7 g/dL (ref 6.0–8.3)
Total Bilirubin: 0.4 mg/dL (ref 0.2–1.2)

## 2015-06-03 LAB — BASIC METABOLIC PANEL
BUN: 12 mg/dL (ref 6–23)
CALCIUM: 9.6 mg/dL (ref 8.4–10.5)
CO2: 28 meq/L (ref 19–32)
Chloride: 103 mEq/L (ref 96–112)
Creatinine, Ser: 0.85 mg/dL (ref 0.40–1.50)
GFR: 94.14 mL/min (ref >60.00)
Glucose, Bld: 169 mg/dL — ABNORMAL HIGH (ref 70–99)
Potassium: 4.3 mEq/L (ref 3.5–5.1)
SODIUM: 137 meq/L (ref 135–145)

## 2015-06-03 LAB — HEMOGLOBIN A1C: HEMOGLOBIN A1C: 7.8 % — AB (ref 4.6–6.5)

## 2015-06-03 LAB — TSH: TSH: 0.39 u[IU]/mL (ref 0.35–4.50)

## 2015-06-03 MED ORDER — METHADONE HCL 10 MG PO TABS: 20.0000 mg | ORAL_TABLET | Freq: Four times a day (QID) | ORAL | Status: DC | PRN

## 2015-06-03 MED ORDER — DIAZEPAM 5 MG PO TABS: 5.0000 mg | ORAL_TABLET | Freq: Two times a day (BID) | ORAL | Status: DC | PRN

## 2015-06-03 NOTE — Assessment & Plan Note (Signed)
No change in Glimeperide

## 2015-06-03 NOTE — Assessment & Plan Note (Signed)
  On Methadone   Potential benefits of a long term opioids use as well as potential risks (i.e. addiction risk, apnea etc) and complications (i.e. Somnolence, constipation and others) were explained to the patient and were aknowledged. Benzo use risk is discussed

## 2015-06-03 NOTE — Progress Notes (Signed)
Subjective:  Patient ID: Gabriel Erickson, male    DOB: [birth year]  Age: 74 y.o. MRN: [id number]  CC: No chief complaint on file.   HPI Gabriel Erickson presents for well exam. F//u LBP, spasms, anemia. F/u colon Ca - Dr Brantley Stage will operate on 06/24/15  Outpatient Prescriptions Prior to Visit  Medication Sig Dispense Refill  . ACCU-CHEK SOFTCLIX LANCETS lancets by Other route 2 (two) times daily. Use as instructed     . b complex vitamins tablet Take 1 tablet by mouth daily.      . Cholecalciferol 1000 UNITS tablet Take 1,000 Units by mouth daily.      . ferrous sulfate 325 (65 FE) MG tablet Take 1 tablet (325 mg total) by mouth daily with breakfast. 90 tablet 3  . fluticasone (FLONASE) 50 MCG/ACT nasal spray Place 2 sprays into the nose daily. 16 g 11  . glimepiride (AMARYL) 4 MG tablet TAKE 1 TABLET TWICE DAILY 60 tablet 5  . glucose blood (ONE TOUCH ULTRA TEST) test strip 1 each by Other route 2 (two) times daily. Dx 250.00 fluctuating blood sugar     . polyethylene glycol powder (GLYCOLAX/MIRALAX) powder Take 17 g by mouth 3 (three) times daily. 255 g 3  . pseudoephedrine-guaifenesin (MUCINEX D) 60-600 MG per tablet Take 1 tablet by mouth every 12 (twelve) hours.    . RABEprazole (ACIPHEX) 20 MG tablet Take 1 tablet (20 mg total) by mouth 2 (two) times daily. 60 tablet 5  . sulfamethoxazole-trimethoprim (BACTRIM DS,SEPTRA DS) 800-160 MG per tablet Take 1 tablet by mouth 2 (two) times daily. 20 tablet 0  . triamcinolone (KENALOG) 0.5 % cream Apply topically 2 (two) times daily as needed.      . diazepam (VALIUM) 5 MG tablet Take 1 tablet (5 mg total) by mouth 2 (two) times daily as needed for muscle spasms (spasms). For cramps or insomnia 60 tablet 5  . methadone (DOLOPHINE) 10 MG tablet Take 2 tablets (20 mg total) by mouth 4 (four) times daily as needed for severe pain. For pain - FILL ON OR AFTER 05/20/2015 240 tablet 0   No facility-administered medications prior to visit.    ROS Review  of Systems  Constitutional: Positive for fatigue. Negative for appetite change and unexpected weight change.  HENT: Negative for congestion, nosebleeds, sneezing, sore throat and trouble swallowing.   Eyes: Negative for itching and visual disturbance.  Respiratory: Negative for cough.   Cardiovascular: Negative for chest pain, palpitations and leg swelling.  Gastrointestinal: Negative for nausea, diarrhea, blood in stool and abdominal distention.  Genitourinary: Negative for frequency and hematuria.  Musculoskeletal: Positive for back pain. Negative for joint swelling, arthralgias, gait problem and neck pain.  Skin: Negative for rash and wound.  Neurological: Positive for weakness. Negative for dizziness, tremors and speech difficulty.  Psychiatric/Behavioral: Negative for sleep disturbance, dysphoric mood and agitation. The patient is not nervous/anxious.     Objective:  BP 128/72 mmHg  Pulse 94  Ht 6' (1.829 m)  Wt 177 lb (80.287 kg)  BMI 24.00 kg/m2  SpO2 97%  BP Readings from Last 3 Encounters:  06/03/15 128/72  05/07/15 139/72  04/01/15 144/76    Wt Readings from Last 3 Encounters:  06/03/15 177 lb (80.287 kg)  05/07/15 179 lb (81.194 kg)  04/01/15 179 lb 6.4 oz (81.375 kg)    Physical Exam  Constitutional: He is oriented to person, place, and time. He appears well-developed. No distress.  NAD  HENT:  Mouth/Throat: Oropharynx is clear and moist.  Eyes: Conjunctivae are normal. Pupils are equal, round, and reactive to light.  Neck: Normal range of motion. No JVD present. No thyromegaly present.  Cardiovascular: Normal rate, regular rhythm, normal heart sounds and intact distal pulses.  Exam reveals no gallop and no friction rub.   No murmur heard. Pulmonary/Chest: Effort normal and breath sounds normal. No respiratory distress. He has no wheezes. He has no rales. He exhibits no tenderness.  Abdominal: Soft. Bowel sounds are normal. He exhibits no distension and no  mass. There is no tenderness. There is no rebound and no guarding.  Musculoskeletal: Normal range of motion. He exhibits no edema or tenderness.  Lymphadenopathy:    He has no cervical adenopathy.  Neurological: He is alert and oriented to person, place, and time. He has normal reflexes. No cranial nerve deficit. He exhibits normal muscle tone. He displays a negative Romberg sign. Coordination and gait normal.  Skin: Skin is warm and dry. No rash noted.  Psychiatric: He has a normal mood and affect. His behavior is normal. Judgment and thought content normal.    Lab Results  Component Value Date   WBC 5.5 03/20/2015   HGB 11.6* 03/20/2015   HCT 36.0* 03/20/2015   PLT 319.0 03/20/2015   GLUCOSE 169* 06/03/2015   CHOL 199 02/25/2014   TRIG 239* 02/25/2014   HDL 43 02/25/2014   LDLDIRECT 142.3 04/12/2010   LDLCALC 108* 02/25/2014   ALT 15 06/03/2015   AST 20 06/03/2015   NA 137 06/03/2015   K 4.3 06/03/2015   CL 103 06/03/2015   CREATININE 0.85 06/03/2015   BUN 12 06/03/2015   CO2 28 06/03/2015   TSH 0.39 06/03/2015   PSA 1.33 12/12/2014   HGBA1C 7.8* 06/03/2015    Ct Chest W Contrast  05/15/2015  CLINICAL DATA:  Newly diagnosed colon cancer, staging workup. EXAM: CT CHEST WITH CONTRAST TECHNIQUE: Multidetector CT imaging of the chest was performed during intravenous contrast administration. CONTRAST:  65mL OMNIPAQUE IOHEXOL 300 MG/ML  SOLN COMPARISON:  03/27/2015; 12/02/2009 FINDINGS: Mediastinum/Nodes: 1.6 cm hypodense right thyroid nodule, image 6 series 2. No pathologic thoracic adenopathy. Lungs/Pleura: 0.7 by 0.5 cm right upper lobe pulmonary nodule, image 20 series 3, small punctate calcification anteriorly within this nodule. Triangular peripheral nodule in the right upper lobe measures 4 mm in diameter on image 45 series 603. 3 mm subpleural lymph node along the right middle lobe side of the minor fissure, image 39 series 603. Peripheral 4 mm subpleural nodule laterally  along the right middle lobe, image 33 series 3. Upper abdomen: Hepatic steatosis. Punctate calcifications in the pancreatic parenchyma compatible with chronic pancreatitis. Musculoskeletal: Lower cervical and thoracic spondylosis. 7 mm faint sclerotic lesion in the T10 vertebral body eccentric to the right on image 75 series 603, no change from 07/22/2008, considered benign. Probable hemangioma measuring 6 mm in the T6 vertebral body eccentric to the right. The IMPRESSION: 1. A 7 by 5 mm nodule in the right upper lobe has punctate calcification anteriorly, and although not entirely specific is probably a granuloma. This nodule and the several additional smaller nodules along the right upper lobe and right middle lobe, although likely benign, warrant observation by chest CT in order to ensure lack of progression. 2. Hepatic steatosis. 3. Chronic calcific pancreatitis. 4. 1.6 cm hypodense solid right thyroid nodule. Consider further evaluation with thyroid ultrasound. If patient is clinically hyperthyroid, consider nuclear medicine thyroid uptake and scan. Electronically Signed  By: Van Clines M.D.   On: 05/15/2015 09:14    Assessment & Plan:   Diagnoses and all orders for this visit:  Controlled type 2 diabetes mellitus with diabetic polyneuropathy, without long-term current use of insulin ([place])  Well adult exam -     EKG 12-Lead  Chronic midline low back pain with sciatica, sciatica laterality unspecified  Malignant neoplasm of colon, unspecified part of colon ([place])  Other orders -     Discontinue: methadone (DOLOPHINE) 10 MG tablet; Take 2 tablets (20 mg total) by mouth 4 (four) times daily as needed for severe pain. For pain - FILL ON OR AFTER 06/19/2015 -     diazepam (VALIUM) 5 MG tablet; Take 1 tablet (5 mg total) by mouth 2 (two) times daily as needed for muscle spasms (spasms). For cramps or insomnia -     Discontinue: methadone (DOLOPHINE) 10 MG tablet; Take 2 tablets (20 mg total)  by mouth 4 (four) times daily as needed for severe pain. For pain - FILL ON OR AFTER 07/19/2015 -     methadone (DOLOPHINE) 10 MG tablet; Take 2 tablets (20 mg total) by mouth 4 (four) times daily as needed for severe pain. For pain - FILL ON OR AFTER 08/19/2015   I have discontinued Mr. Berhane methadone and methadone. I have also changed his methadone. Additionally, I am having him maintain his ACCU-CHEK SOFTCLIX LANCETS, glucose blood, triamcinolone cream, Cholecalciferol, b complex vitamins, pseudoephedrine-guaifenesin, fluticasone, sulfamethoxazole-trimethoprim, RABEprazole, glimepiride, ferrous sulfate, polyethylene glycol powder, metroNIDAZOLE, neomycin, and diazepam.  Meds ordered this encounter  Medications  . metroNIDAZOLE (FLAGYL) 500 MG tablet    Sig: TAKE TWO TABLETS BY MOUTH AT 2PM, 3PM AND 10PM THE DAY PRIOR TO COLON OPERATION    Refill:  0  . neomycin (MYCIFRADIN) 500 MG tablet    Sig: TAKE TWO TABLETS BY MOUTH AT 2PM, 3PM AND 10 PM THE DAY PRIOR TO SURGERY    Refill:  0  . DISCONTD: methadone (DOLOPHINE) 10 MG tablet    Sig: Take 2 tablets (20 mg total) by mouth 4 (four) times daily as needed for severe pain. For pain - FILL ON OR AFTER 06/19/2015    Dispense:  240 tablet    Refill:  0  . diazepam (VALIUM) 5 MG tablet    Sig: Take 1 tablet (5 mg total) by mouth 2 (two) times daily as needed for muscle spasms (spasms). For cramps or insomnia    Dispense:  60 tablet    Refill:  3  . DISCONTD: methadone (DOLOPHINE) 10 MG tablet    Sig: Take 2 tablets (20 mg total) by mouth 4 (four) times daily as needed for severe pain. For pain - FILL ON OR AFTER 07/19/2015    Dispense:  240 tablet    Refill:  0  . methadone (DOLOPHINE) 10 MG tablet    Sig: Take 2 tablets (20 mg total) by mouth 4 (four) times daily as needed for severe pain. For pain - FILL ON OR AFTER 08/19/2015    Dispense:  240 tablet    Refill:  0   EKG NSR  Follow-up: Return in about 3 months (around 09/03/2015) for a  follow-up visit.  Walker Kehr, MD

## 2015-06-03 NOTE — Assessment & Plan Note (Signed)
Here for medicare wellness/physical  Diet: heart healthy  Physical activity: sedentary  Depression/mood screen: negative  Hearing: intact to whispered voice  Visual acuity: grossly normal, performs annual eye exam  ADLs: capable  Fall risk: increased due to ataxia/LE weakness Home safety: good  Cognitive evaluation: intact to orientation, naming, recall and repetition  EOL planning: adv directives, full code/ I agree  I have personally reviewed and have noted  1. The patient's medical and social history  2. Their use of alcohol, tobacco or illicit drugs  3. Their current medications and supplements  4. The patient's functional ability including ADL's, fall risks, home safety risks and hearing or visual impairment.  5. Diet and physical activities  6. Evidence for depression or mood disorders 7. Roster of doctors was reviewed    Today patient counseled on age appropriate routine health concerns for screening and prevention, each reviewed and up to date or declined. Immunizations reviewed and up to date or declined. Labs ordered and reviewed. Risk factors for depression reviewed and negative. Hearing function and visual acuity are intact. ADLs screened and addressed as needed. Functional ability and level of safety reviewed and appropriate. Education, counseling and referrals performed based on assessed risks today. Patient provided with a copy of personalized plan for preventive services.

## 2015-06-03 NOTE — Progress Notes (Signed)
Pre visit review using our clinic review tool, if applicable. No additional management support is needed unless otherwise documented below in the visit note. 

## 2015-06-03 NOTE — Assessment & Plan Note (Signed)
Dr Brantley Stage will operate on 06/24/15

## 2015-06-04 ENCOUNTER — Other Ambulatory Visit: Payer: Self-pay | Admitting: *Deleted

## 2015-06-04 DIAGNOSIS — E041 Nontoxic single thyroid nodule: Secondary | ICD-10-CM

## 2015-06-04 LAB — CEA: CEA: 2.1 ng/mL (ref 0.0–5.0)

## 2015-06-04 LAB — IBC PANEL
Iron: 13 ug/dL — ABNORMAL LOW (ref 42–165)
SATURATION RATIOS: 2.9 % — AB (ref 20.0–50.0)
TRANSFERRIN: 317 mg/dL (ref 212.0–360.0)

## 2015-06-12 ENCOUNTER — Ambulatory Visit (HOSPITAL_COMMUNITY): Payer: Medicare Other

## 2015-06-16 ENCOUNTER — Encounter (HOSPITAL_COMMUNITY): Payer: Self-pay

## 2015-06-16 ENCOUNTER — Encounter (HOSPITAL_COMMUNITY)
Admission: RE | Admit: 2015-06-16 | Discharge: 2015-06-16 | Disposition: A | Discharge status: Home / Self Care | Payer: Medicare Other | Source: Ambulatory Visit | Attending: Surgery | Admitting: Surgery

## 2015-06-16 DIAGNOSIS — Z01812 Encounter for preprocedural laboratory examination: Secondary | ICD-10-CM | POA: Insufficient documentation

## 2015-06-16 DIAGNOSIS — C182 Malignant neoplasm of ascending colon: Secondary | ICD-10-CM | POA: Diagnosis not present

## 2015-06-16 HISTORY — DX: Frequency of micturition: R35.0

## 2015-06-16 HISTORY — DX: Other seasonal allergic rhinitis: J30.2

## 2015-06-16 LAB — CBC WITH DIFFERENTIAL/PLATELET
BASOS PCT: 0 %
Basophils Absolute: 0 10*3/uL (ref 0.0–0.1)
EOS ABS: 0.1 10*3/uL (ref 0.0–0.7)
Eosinophils Relative: 2 %
HEMATOCRIT: 34.2 % — AB (ref 39.0–52.0)
HEMOGLOBIN: 10.1 g/dL — AB (ref 13.0–17.0)
Lymphocytes Relative: 29 %
Lymphs Abs: 1.7 10*3/uL (ref 0.7–4.0)
MCH: 22.2 pg — AB (ref 26.0–34.0)
MCHC: 29.5 g/dL — ABNORMAL LOW (ref 30.0–36.0)
MCV: 75.2 fL — AB (ref 78.0–100.0)
MONO ABS: 0.7 10*3/uL (ref 0.1–1.0)
Monocytes Relative: 12 %
NEUTROS ABS: 3.3 10*3/uL (ref 1.7–7.7)
NEUTROS PCT: 57 %
Platelets: 350 10*3/uL (ref 150–400)
RBC: 4.55 MIL/uL (ref 4.22–5.81)
RDW: 20.9 % — ABNORMAL HIGH (ref 11.5–15.5)
WBC: 5.7 10*3/uL (ref 4.0–10.5)

## 2015-06-16 LAB — COMPREHENSIVE METABOLIC PANEL
ALK PHOS: 88 U/L (ref 38–126)
ALT: 19 U/L (ref 17–63)
AST: 23 U/L (ref 15–41)
Albumin: 3.6 g/dL (ref 3.5–5.0)
Anion gap: 6 (ref 5–15)
BUN: 10 mg/dL (ref 6–20)
CALCIUM: 9.3 mg/dL (ref 8.9–10.3)
CO2: 27 mmol/L (ref 22–32)
CREATININE: 0.86 mg/dL (ref 0.61–1.24)
Chloride: 105 mmol/L (ref 101–111)
GFR calc non Af Amer: >60 mL/min (ref >60)
Glucose, Bld: 135 mg/dL — ABNORMAL HIGH (ref 65–99)
Potassium: 4.2 mmol/L (ref 3.5–5.1)
SODIUM: 138 mmol/L (ref 135–145)
Total Bilirubin: 0.5 mg/dL (ref 0.3–1.2)
Total Protein: 7.4 g/dL (ref 6.5–8.1)

## 2015-06-16 LAB — GLUCOSE, CAPILLARY: GLUCOSE-CAPILLARY: 148 mg/dL — AB (ref 65–99)

## 2015-06-16 NOTE — Pre-Procedure Instructions (Signed)
Noan Witko Spivack  06/16/2015      [id number] - [place], [place] - [address] [address] Phone: [phone] Fax: [phone]  [place], [place] - [address] [place] [address] Phone: [phone] Fax: [phone]    Your procedure is scheduled on 06-24-2015  Wednesday   Report to [facility].   Call this number if you have problems the morning of surgery:  [phone]   Remember:  Do not eat food or drink liquids after midnight.   Take these medicines the morning of surgery with A SIP OF WATER Valium if needed,flonase nasal spray,methadone if needed,aciphex                                                                                                   How to Manage Your Diabetes Before Surgery   Why is it important to control my blood sugar before and after surgery?   Improving blood sugar levels before and after surgery helps healing and can limit problems.  A [place] of improving blood sugar control is eating a healthy diet by:  - Eating less sugar and carbohydrates  - Increasing activity/exercise  - Talk with your doctor about reaching your blood sugar goals  High blood sugars (greater than 180 mg/dL) can raise your risk of infections and slow down your recovery so you will need to focus on controlling your diabetes during the weeks before surgery.  Make sure that the doctor who takes care of your diabetes knows about your planned surgery including the date and location.  How do I manage my blood sugars before surgery?   Check your blood sugar at least 4 times a day, 2 days before surgery to make sure that they are not too high or low.   Check your blood sugar the morning of your surgery when you wake up and every 2  hours until you get to the Short-Stay unit.  If your blood sugar is less than 70 mg/dL, you will need  to treat for low blood sugar by:  Treat a low blood sugar (less than 70 mg/dL) with 1/2 cup of clear juice (cranberry or apple), 4 glucose tablets, OR glucose gel.  Recheck blood sugar in 15 minutes after treatment (to make sure it is greater than 70 mg/dL).  If blood sugar is not greater than 70 mg/dL on re-check, call [phone] for further instructions.   Report your blood sugar to the Short-Stay nurse when you get to Short-Stay.  References:  [facility], 2007 "How to Manage your Diabetes Before and After Surgery".  What do I do about my diabetes medications?   Do not take oral diabetes medicines (pills) the morning of surgery.              I  .          Do not wear jewelry,  Do not wear lotions, powders,  or perfumes.  You may not wear deodorant.  Do not shave 48 hours prior to surgery.  Men may shave face and neck.   Do not bring valuables to the hospital.  [facility] is not responsible for any belongings or valuables.  Contacts, dentures or bridgework may not be worn into surgery.  Leave your suitcase in the car.  After surgery it may be brought to your room.  For patients admitted to the hospital, discharge time will be determined by your treatment team.  Patients discharged the day of surgery will not be allowed to drive home.    Special instructions:  See attached Sheet for instructions on CHG shower  Please read over the following fact sheets that you were given. Pain Booklet, Coughing and Deep Breathing and Surgical Site Infection Prevention

## 2015-06-23 MED ORDER — ALVIMOPAN 12 MG PO CAPS
12.0000 mg | ORAL_CAPSULE | Freq: Once | ORAL | Status: AC
  Administered 2015-06-24: 12 mg via ORAL
  Filled 2015-06-23: qty 1

## 2015-06-24 ENCOUNTER — Inpatient Hospital Stay (HOSPITAL_COMMUNITY): Payer: Medicare Other | Admitting: Certified Registered"

## 2015-06-24 ENCOUNTER — Encounter (HOSPITAL_COMMUNITY): Payer: Self-pay | Admitting: *Deleted

## 2015-06-24 ENCOUNTER — Inpatient Hospital Stay (HOSPITAL_COMMUNITY)
Admission: RE | Admit: 2015-06-24 | Discharge: 2015-06-29 | DRG: 330 | Disposition: A | Discharge status: Home / Self Care | Payer: Medicare Other | Source: Ambulatory Visit | Attending: Surgery | Admitting: Surgery

## 2015-06-24 ENCOUNTER — Encounter (HOSPITAL_COMMUNITY)
Admission: RE | Disposition: A | Discharge status: Home / Self Care | Payer: Self-pay | Source: Ambulatory Visit | Attending: Surgery

## 2015-06-24 DIAGNOSIS — K219 Gastro-esophageal reflux disease without esophagitis: Secondary | ICD-10-CM | POA: Diagnosis present

## 2015-06-24 DIAGNOSIS — R1909 Other intra-abdominal and pelvic swelling, mass and lump: Secondary | ICD-10-CM | POA: Diagnosis present

## 2015-06-24 DIAGNOSIS — K5900 Constipation, unspecified: Secondary | ICD-10-CM | POA: Diagnosis present

## 2015-06-24 DIAGNOSIS — C182 Malignant neoplasm of ascending colon: Principal | ICD-10-CM | POA: Diagnosis present

## 2015-06-24 DIAGNOSIS — E119 Type 2 diabetes mellitus without complications: Secondary | ICD-10-CM | POA: Diagnosis present

## 2015-06-24 DIAGNOSIS — Z7984 Long term (current) use of oral hypoglycemic drugs: Secondary | ICD-10-CM | POA: Diagnosis not present

## 2015-06-24 DIAGNOSIS — K5669 Other intestinal obstruction: Secondary | ICD-10-CM | POA: Diagnosis not present

## 2015-06-24 DIAGNOSIS — Z85038 Personal history of other malignant neoplasm of large intestine: Secondary | ICD-10-CM | POA: Diagnosis present

## 2015-06-24 DIAGNOSIS — C189 Malignant neoplasm of colon, unspecified: Secondary | ICD-10-CM | POA: Diagnosis present

## 2015-06-24 HISTORY — PX: PARTIAL COLECTOMY: SHX5273

## 2015-06-24 HISTORY — DX: Malignant neoplasm of ascending colon: C18.2

## 2015-06-24 HISTORY — PX: LAPAROSCOPIC PARTIAL COLECTOMY: SHX5907

## 2015-06-24 HISTORY — DX: Malignant (primary) neoplasm, unspecified: C80.1

## 2015-06-24 LAB — GLUCOSE, CAPILLARY
GLUCOSE-CAPILLARY: 119 mg/dL — AB (ref 65–99)
GLUCOSE-CAPILLARY: 106 mg/dL — AB (ref 65–99)
Glucose-Capillary: 195 mg/dL — ABNORMAL HIGH (ref 65–99)
Glucose-Capillary: 170 mg/dL — ABNORMAL HIGH (ref 65–99)

## 2015-06-24 SURGERY — LAPAROSCOPIC PARTIAL COLECTOMY
Anesthesia: General | Site: Abdomen

## 2015-06-24 MED ORDER — PROPOFOL 10 MG/ML IV BOLUS
INTRAVENOUS | Status: AC
  Filled 2015-06-24: qty 20

## 2015-06-24 MED ORDER — DEXTROSE 5 % IV SOLN
2.0000 g | INTRAVENOUS | Status: AC
  Administered 2015-06-24: 2 g via INTRAVENOUS
  Filled 2015-06-24 (×2): qty 2

## 2015-06-24 MED ORDER — ENOXAPARIN SODIUM 40 MG/0.4ML ~~LOC~~ SOLN
40.0000 mg | SUBCUTANEOUS | Status: DC
  Administered 2015-06-25 – 2015-06-29 (×5): 40 mg via SUBCUTANEOUS
  Filled 2015-06-24 (×5): qty 0.4

## 2015-06-24 MED ORDER — FENTANYL CITRATE (PF) 250 MCG/5ML IJ SOLN
INTRAMUSCULAR | Status: AC
  Filled 2015-06-24: qty 5

## 2015-06-24 MED ORDER — ONDANSETRON HCL 4 MG/2ML IJ SOLN
INTRAMUSCULAR | Status: AC
  Filled 2015-06-24: qty 2

## 2015-06-24 MED ORDER — VECURONIUM BROMIDE 10 MG IV SOLR
INTRAVENOUS | Status: DC | PRN
  Administered 2015-06-24 (×2): 2 mg via INTRAVENOUS

## 2015-06-24 MED ORDER — NALOXONE HCL 0.4 MG/ML IJ SOLN: 0.4000 mg | INTRAMUSCULAR | Status: DC | PRN

## 2015-06-24 MED ORDER — MIDAZOLAM HCL 5 MG/5ML IJ SOLN
INTRAMUSCULAR | Status: DC | PRN
  Administered 2015-06-24: 2 mg via INTRAVENOUS

## 2015-06-24 MED ORDER — SUGAMMADEX SODIUM 200 MG/2ML IV SOLN
INTRAVENOUS | Status: AC
  Filled 2015-06-24: qty 2

## 2015-06-24 MED ORDER — STERILE WATER FOR INJECTION IJ SOLN
INTRAMUSCULAR | Status: AC
  Filled 2015-06-24: qty 10

## 2015-06-24 MED ORDER — KETOROLAC TROMETHAMINE 30 MG/ML IJ SOLN
30.0000 mg | Freq: Once | INTRAMUSCULAR | Status: AC
  Administered 2015-06-24: 30 mg via INTRAVENOUS

## 2015-06-24 MED ORDER — VECURONIUM BROMIDE 10 MG IV SOLR
INTRAVENOUS | Status: AC
  Filled 2015-06-24: qty 10

## 2015-06-24 MED ORDER — LACTATED RINGERS IV SOLN
INTRAVENOUS | Status: DC | PRN
  Administered 2015-06-24 (×2): via INTRAVENOUS

## 2015-06-24 MED ORDER — SODIUM CHLORIDE 0.9 % IV SOLN
INTRAVENOUS | Status: DC | PRN
  Administered 2015-06-24: 09:00:00 via INTRAVENOUS

## 2015-06-24 MED ORDER — EPHEDRINE SULFATE 50 MG/ML IJ SOLN
INTRAMUSCULAR | Status: AC
Start: 2015-06-24 — End: 2015-06-24
  Filled 2015-06-24: qty 1

## 2015-06-24 MED ORDER — SUCCINYLCHOLINE CHLORIDE 20 MG/ML IJ SOLN
INTRAMUSCULAR | Status: AC
  Filled 2015-06-24: qty 1

## 2015-06-24 MED ORDER — HYDROMORPHONE 1 MG/ML IV SOLN
INTRAVENOUS | Status: DC
  Administered 2015-06-24 (×3): 0.2 mg via INTRAVENOUS
  Administered 2015-06-25: 0.6 mg via INTRAVENOUS
  Administered 2015-06-25: 0.4 mg via INTRAVENOUS
  Administered 2015-06-25: 1 mg via INTRAVENOUS
  Administered 2015-06-25: 0.2 mg via INTRAVENOUS
  Administered 2015-06-25: 0.8 mg via INTRAVENOUS
  Administered 2015-06-25 – 2015-06-26 (×4): 0.2 mg via INTRAVENOUS

## 2015-06-24 MED ORDER — SUGAMMADEX SODIUM 200 MG/2ML IV SOLN
INTRAVENOUS | Status: DC | PRN
  Administered 2015-06-24: 200 mg via INTRAVENOUS

## 2015-06-24 MED ORDER — PHENYLEPHRINE HCL 10 MG/ML IJ SOLN
INTRAMUSCULAR | Status: DC | PRN
  Administered 2015-06-24 (×5): 80 ug via INTRAVENOUS

## 2015-06-24 MED ORDER — SODIUM CHLORIDE 0.9 % IJ SOLN: 9.0000 mL | INTRAMUSCULAR | Status: DC | PRN

## 2015-06-24 MED ORDER — HYDROMORPHONE HCL 1 MG/ML IJ SOLN
INTRAMUSCULAR | Status: AC
  Administered 2015-06-24: 14:00:00
  Filled 2015-06-24: qty 1

## 2015-06-24 MED ORDER — BUPIVACAINE-EPINEPHRINE (PF) 0.25% -1:200000 IJ SOLN
INTRAMUSCULAR | Status: AC
  Filled 2015-06-24: qty 30

## 2015-06-24 MED ORDER — DIPHENHYDRAMINE HCL 50 MG/ML IJ SOLN: 12.5000 mg | Freq: Four times a day (QID) | INTRAMUSCULAR | Status: DC | PRN

## 2015-06-24 MED ORDER — ONDANSETRON HCL 4 MG/2ML IJ SOLN
INTRAMUSCULAR | Status: DC | PRN
  Administered 2015-06-24 (×2): 4 mg via INTRAVENOUS

## 2015-06-24 MED ORDER — LIDOCAINE HCL (CARDIAC) 20 MG/ML IV SOLN
INTRAVENOUS | Status: DC | PRN
  Administered 2015-06-24: 80 mg via INTRAVENOUS

## 2015-06-24 MED ORDER — FENTANYL CITRATE (PF) 250 MCG/5ML IJ SOLN
INTRAMUSCULAR | Status: DC | PRN
  Administered 2015-06-24: 100 ug via INTRAVENOUS
  Administered 2015-06-24 (×2): 50 ug via INTRAVENOUS

## 2015-06-24 MED ORDER — OXYCODONE-ACETAMINOPHEN 5-325 MG PO TABS
1.0000 | ORAL_TABLET | ORAL | Status: DC | PRN
  Filled 2015-06-24: qty 1

## 2015-06-24 MED ORDER — ONDANSETRON HCL 4 MG/2ML IJ SOLN
4.0000 mg | Freq: Four times a day (QID) | INTRAMUSCULAR | Status: DC | PRN
  Administered 2015-06-25 – 2015-06-29 (×10): 4 mg via INTRAVENOUS
  Filled 2015-06-24 (×9): qty 2

## 2015-06-24 MED ORDER — ONDANSETRON HCL 4 MG/2ML IJ SOLN: 4.0000 mg | Freq: Four times a day (QID) | INTRAMUSCULAR | Status: DC | PRN

## 2015-06-24 MED ORDER — METRONIDAZOLE IN NACL 5-0.79 MG/ML-% IV SOLN
500.0000 mg | INTRAVENOUS | Status: AC
  Administered 2015-06-24: 500 mg via INTRAVENOUS
  Filled 2015-06-24 (×2): qty 100

## 2015-06-24 MED ORDER — ALVIMOPAN 12 MG PO CAPS
12.0000 mg | ORAL_CAPSULE | Freq: Two times a day (BID) | ORAL | Status: DC
  Administered 2015-06-25 – 2015-06-27 (×5): 12 mg via ORAL
  Filled 2015-06-24 (×5): qty 1

## 2015-06-24 MED ORDER — INSULIN ASPART 100 UNIT/ML ~~LOC~~ SOLN
0.0000 [IU] | SUBCUTANEOUS | Status: DC
  Administered 2015-06-25 (×2): 3 [IU] via SUBCUTANEOUS
  Administered 2015-06-25: 2 [IU] via SUBCUTANEOUS
  Administered 2015-06-25 – 2015-06-26 (×2): 3 [IU] via SUBCUTANEOUS
  Administered 2015-06-26: 2 [IU] via SUBCUTANEOUS
  Administered 2015-06-26 (×2): 3 [IU] via SUBCUTANEOUS
  Administered 2015-06-26 – 2015-06-27 (×2): 2 [IU] via SUBCUTANEOUS
  Administered 2015-06-27 (×3): 3 [IU] via SUBCUTANEOUS
  Administered 2015-06-27 – 2015-06-28 (×2): 2 [IU] via SUBCUTANEOUS
  Administered 2015-06-28 (×3): 3 [IU] via SUBCUTANEOUS
  Administered 2015-06-28 – 2015-06-29 (×3): 2 [IU] via SUBCUTANEOUS

## 2015-06-24 MED ORDER — HYDROMORPHONE 1 MG/ML IV SOLN
INTRAVENOUS | Status: AC
  Administered 2015-06-24: 1 mg
  Filled 2015-06-24: qty 25

## 2015-06-24 MED ORDER — PHENYLEPHRINE HCL 10 MG/ML IJ SOLN
10.0000 mg | INTRAVENOUS | Status: DC | PRN
  Administered 2015-06-24: 50 ug/min via INTRAVENOUS

## 2015-06-24 MED ORDER — ZOLPIDEM TARTRATE 5 MG PO TABS
5.0000 mg | ORAL_TABLET | Freq: Every evening | ORAL | Status: DC | PRN
  Administered 2015-06-28: 5 mg via ORAL
  Filled 2015-06-24 (×2): qty 1

## 2015-06-24 MED ORDER — ROCURONIUM BROMIDE 50 MG/5ML IV SOLN
INTRAVENOUS | Status: AC
  Filled 2015-06-24: qty 1

## 2015-06-24 MED ORDER — PROMETHAZINE HCL 25 MG/ML IJ SOLN: 6.2500 mg | INTRAMUSCULAR | Status: DC | PRN

## 2015-06-24 MED ORDER — MIDAZOLAM HCL 2 MG/2ML IJ SOLN
INTRAMUSCULAR | Status: AC
  Filled 2015-06-24: qty 2

## 2015-06-24 MED ORDER — DEXAMETHASONE SODIUM PHOSPHATE 4 MG/ML IJ SOLN
INTRAMUSCULAR | Status: AC
  Filled 2015-06-24: qty 2

## 2015-06-24 MED ORDER — KCL IN DEXTROSE-NACL 20-5-0.9 MEQ/L-%-% IV SOLN
INTRAVENOUS | Status: DC
  Administered 2015-06-24 – 2015-06-28 (×9): via INTRAVENOUS
  Administered 2015-06-28: 1 mL via INTRAVENOUS
  Filled 2015-06-24 (×12): qty 1000

## 2015-06-24 MED ORDER — SODIUM CHLORIDE 0.9 % IR SOLN
Status: DC | PRN
  Administered 2015-06-24 (×2): 1000 mL

## 2015-06-24 MED ORDER — PROPOFOL 10 MG/ML IV BOLUS
INTRAVENOUS | Status: DC | PRN
  Administered 2015-06-24: 120 mg via INTRAVENOUS

## 2015-06-24 MED ORDER — PHENYLEPHRINE 40 MCG/ML (10ML) SYRINGE FOR IV PUSH (FOR BLOOD PRESSURE SUPPORT)
PREFILLED_SYRINGE | INTRAVENOUS | Status: AC
  Filled 2015-06-24: qty 10

## 2015-06-24 MED ORDER — ONDANSETRON HCL 4 MG PO TABS: 4.0000 mg | ORAL_TABLET | Freq: Four times a day (QID) | ORAL | Status: DC | PRN

## 2015-06-24 MED ORDER — SODIUM CHLORIDE 0.9 % IJ SOLN
INTRAMUSCULAR | Status: AC
  Filled 2015-06-24: qty 10

## 2015-06-24 MED ORDER — BUPIVACAINE-EPINEPHRINE (PF) 0.25% -1:200000 IJ SOLN
INTRAMUSCULAR | Status: DC | PRN
  Administered 2015-06-24: 30 mL

## 2015-06-24 MED ORDER — ALBUMIN HUMAN 5 % IV SOLN
INTRAVENOUS | Status: DC | PRN
  Administered 2015-06-24: 09:00:00 via INTRAVENOUS

## 2015-06-24 MED ORDER — CHLORHEXIDINE GLUCONATE 4 % EX LIQD: 1.0000 "application " | Freq: Once | CUTANEOUS | Status: DC

## 2015-06-24 MED ORDER — LIDOCAINE HCL (CARDIAC) 20 MG/ML IV SOLN
INTRAVENOUS | Status: AC
  Filled 2015-06-24: qty 5

## 2015-06-24 MED ORDER — ROCURONIUM BROMIDE 100 MG/10ML IV SOLN
INTRAVENOUS | Status: DC | PRN
  Administered 2015-06-24: 50 mg via INTRAVENOUS

## 2015-06-24 MED ORDER — DIPHENHYDRAMINE HCL 12.5 MG/5ML PO ELIX: 12.5000 mg | ORAL_SOLUTION | Freq: Four times a day (QID) | ORAL | Status: DC | PRN

## 2015-06-24 MED ORDER — HYDROMORPHONE HCL 1 MG/ML IJ SOLN
0.2500 mg | INTRAMUSCULAR | Status: DC | PRN
  Administered 2015-06-24 (×2): 0.5 mg via INTRAVENOUS

## 2015-06-24 MED ORDER — KETOROLAC TROMETHAMINE 30 MG/ML IJ SOLN
INTRAMUSCULAR | Status: AC
  Administered 2015-06-24: 14:00:00
  Filled 2015-06-24: qty 1

## 2015-06-24 SURGICAL SUPPLY — 83 items
APPLIER CLIP 5 13 M/L LIGAMAX5 (MISCELLANEOUS)
APPLIER CLIP ROT 10 11.4 M/L (STAPLE)
APR CLP MED LRG 11.4X10 (STAPLE)
APR CLP MED LRG 5 ANG JAW (MISCELLANEOUS)
BLADE SURG ROTATE 9660 (MISCELLANEOUS) IMPLANT
CANISTER SUCTION 2500CC (MISCELLANEOUS) ×3 IMPLANT
CELLS DAT CNTRL 66122 CELL SVR (MISCELLANEOUS) IMPLANT
CHLORAPREP W/TINT 26ML (MISCELLANEOUS) ×3 IMPLANT
CLIP APPLIE 5 13 M/L LIGAMAX5 (MISCELLANEOUS) IMPLANT
CLIP APPLIE ROT 10 11.4 M/L (STAPLE) IMPLANT
COVER MAYO STAND STRL (DRAPES) ×3 IMPLANT
COVER SURGICAL LIGHT HANDLE (MISCELLANEOUS) ×6 IMPLANT
DRAPE PROXIMA HALF (DRAPES) IMPLANT
DRAPE UTILITY XL STRL (DRAPES) ×15 IMPLANT
DRAPE WARM FLUID 44X44 (DRAPE) ×3 IMPLANT
DRSG OPSITE POSTOP 4X10 (GAUZE/BANDAGES/DRESSINGS) IMPLANT
DRSG OPSITE POSTOP 4X8 (GAUZE/BANDAGES/DRESSINGS) ×2 IMPLANT
ELECT BLADE 6.5 EXT (BLADE) ×3 IMPLANT
ELECT CAUTERY BLADE 6.4 (BLADE) ×6 IMPLANT
ELECT REM PT RETURN 9FT ADLT (ELECTROSURGICAL) ×3
ELECTRODE REM PT RTRN 9FT ADLT (ELECTROSURGICAL) ×1 IMPLANT
GAUZE SPONGE 2X2 8PLY STRL LF (GAUZE/BANDAGES/DRESSINGS) ×1 IMPLANT
GEL ULTRASOUND 20GR AQUASONIC (MISCELLANEOUS) IMPLANT
GLOVE BIO SURGEON STRL SZ7 (GLOVE) ×4 IMPLANT
GLOVE BIO SURGEON STRL SZ8 (GLOVE) ×6 IMPLANT
GLOVE BIOGEL PI IND STRL 7.0 (GLOVE) IMPLANT
GLOVE BIOGEL PI IND STRL 7.5 (GLOVE) IMPLANT
GLOVE BIOGEL PI IND STRL 8 (GLOVE) ×2 IMPLANT
GLOVE BIOGEL PI INDICATOR 7.0 (GLOVE) ×2
GLOVE BIOGEL PI INDICATOR 7.5 (GLOVE) ×2
GLOVE BIOGEL PI INDICATOR 8 (GLOVE) ×4
GLOVE ECLIPSE 7.5 STRL STRAW (GLOVE) ×2 IMPLANT
GLOVE SURG SS PI 7.0 STRL IVOR (GLOVE) ×2 IMPLANT
GOWN STRL REUS W/ TWL LRG LVL3 (GOWN DISPOSABLE) ×6 IMPLANT
GOWN STRL REUS W/ TWL XL LVL3 (GOWN DISPOSABLE) ×2 IMPLANT
GOWN STRL REUS W/TWL LRG LVL3 (GOWN DISPOSABLE) ×18
GOWN STRL REUS W/TWL XL LVL3 (GOWN DISPOSABLE) ×6
KIT BASIN OR (CUSTOM PROCEDURE TRAY) ×3 IMPLANT
KIT ROOM TURNOVER OR (KITS) ×3 IMPLANT
LEGGING LITHOTOMY PAIR STRL (DRAPES) IMPLANT
LIGASURE IMPACT 36 18CM CVD LR (INSTRUMENTS) ×2 IMPLANT
NS IRRIG 1000ML POUR BTL (IV SOLUTION) ×6 IMPLANT
PAD ARMBOARD 7.5X6 YLW CONV (MISCELLANEOUS) ×6 IMPLANT
PENCIL BUTTON HOLSTER BLD 10FT (ELECTRODE) ×6 IMPLANT
RELOAD PROXIMATE 75MM BLUE (ENDOMECHANICALS) ×6 IMPLANT
RELOAD STAPLE 75 3.8 BLU REG (ENDOMECHANICALS) IMPLANT
RETRACTOR WND ALEXIS 18 MED (MISCELLANEOUS) IMPLANT
RTRCTR WOUND ALEXIS 18CM MED (MISCELLANEOUS)
SCALPEL HARMONIC ACE (MISCELLANEOUS) ×3 IMPLANT
SCISSORS LAP 5X35 DISP (ENDOMECHANICALS) ×3 IMPLANT
SET IRRIG TUBING LAPAROSCOPIC (IRRIGATION / IRRIGATOR) IMPLANT
SLEEVE ENDOPATH XCEL 5M (ENDOMECHANICALS) ×5 IMPLANT
SPECIMEN JAR LARGE (MISCELLANEOUS) ×3 IMPLANT
SPONGE GAUZE 2X2 STER 10/PKG (GAUZE/BANDAGES/DRESSINGS) ×4
SPONGE LAP 18X18 X RAY DECT (DISPOSABLE) ×4 IMPLANT
STAPLER GUN LINEAR PROX 60 (STAPLE) ×2 IMPLANT
STAPLER PROXIMATE 75MM BLUE (STAPLE) ×2 IMPLANT
STAPLER VISISTAT 35W (STAPLE) ×3 IMPLANT
SUCTION POOLE TIP (SUCTIONS) ×3 IMPLANT
SURGILUBE 2OZ TUBE FLIPTOP (MISCELLANEOUS) IMPLANT
SUT PDS AB 1 TP1 96 (SUTURE) ×2 IMPLANT
SUT PROLENE 2 0 CT2 30 (SUTURE) IMPLANT
SUT PROLENE 2 0 KS (SUTURE) IMPLANT
SUT VIC AB 2-0 SH 18 (SUTURE) ×3 IMPLANT
SUT VIC AB 3-0 SH 18 (SUTURE) ×3 IMPLANT
SUT VICRYL AB 2 0 TIES (SUTURE) ×3 IMPLANT
SUT VICRYL AB 3 0 TIES (SUTURE) ×3 IMPLANT
SYR BULB IRRIGATION 50ML (SYRINGE) ×3 IMPLANT
SYS LAPSCP GELPORT 120MM (MISCELLANEOUS) ×3
SYSTEM LAPSCP GELPORT 120MM (MISCELLANEOUS) IMPLANT
TOWEL OR 17X26 10 PK STRL BLUE (TOWEL DISPOSABLE) ×6 IMPLANT
TRAY FOLEY CATH 16FRSI W/METER (SET/KITS/TRAYS/PACK) ×3 IMPLANT
TRAY LAPAROSCOPIC MC (CUSTOM PROCEDURE TRAY) ×3 IMPLANT
TRAY PROCTOSCOPIC FIBER OPTIC (SET/KITS/TRAYS/PACK) IMPLANT
TROCAR XCEL 12X100 BLDLESS (ENDOMECHANICALS) IMPLANT
TROCAR XCEL BLUNT TIP 100MML (ENDOMECHANICALS) IMPLANT
TROCAR XCEL NON-BLD 11X100MML (ENDOMECHANICALS) IMPLANT
TROCAR XCEL NON-BLD 5MMX100MML (ENDOMECHANICALS) ×3 IMPLANT
TUBE CONNECTING 12'X1/4 (SUCTIONS) ×2
TUBE CONNECTING 12X1/4 (SUCTIONS) ×4 IMPLANT
TUBING FILTER THERMOFLATOR (ELECTROSURGICAL) ×3 IMPLANT
TUBING INSUFFLATION (TUBING) ×3 IMPLANT
YANKAUER SUCT BULB TIP NO VENT (SUCTIONS) ×6 IMPLANT

## 2015-06-24 NOTE — Interval H&P Note (Signed)
History and Physical Interval Note:  06/24/2015 7:41 AM  Gabriel Erickson  has presented today for surgery, with the diagnosis of Colon Cancer  The various methods of treatment have been discussed with the patient and family. After consideration of risks, benefits and other options for treatment, the patient has consented to  Procedure(s): LAPAROSCOPIC PARTIAL COLECTOMY (N/A) as a surgical intervention .  The patient's history has been reviewed, patient examined, no change in status, stable for surgery.  I have reviewed the patient's chart and labs.  Questions were answered to the patient's satisfaction.     Justun Anaya A.

## 2015-06-24 NOTE — Anesthesia Postprocedure Evaluation (Signed)
Anesthesia Post Note  Patient: Velma Belshaw Leibold  Procedure(s) Performed: Procedure(s) (LRB): LAPAROSCOPIC PARTIAL COLECTOMY (N/A)  Patient location during evaluation: PACU Anesthesia Type: General Level of consciousness: awake and alert Pain management: pain level controlled Vital Signs Assessment: post-procedure vital signs reviewed and stable Respiratory status: spontaneous breathing and respiratory function stable Cardiovascular status: stable Postop Assessment: no signs of nausea or vomiting Anesthetic complications: no    Last Vitals:  Filed Vitals:   06/24/15 1045 06/24/15 1048  BP:  149/73  Pulse:  85  Temp: 36.6 C   Resp:  17    Last Pain:  Filed Vitals:   06/24/15 1052  PainSc: 4                  Azaria Bartell S

## 2015-06-24 NOTE — Anesthesia Preprocedure Evaluation (Signed)
Anesthesia Evaluation  Patient identified by MRN, date of birth, ID band Patient awake    Reviewed: Allergy & Precautions, NPO status , Patient's Chart, lab work & pertinent test results  Airway Mallampati: II  TM Distance: >3 FB Neck ROM: Full    Dental no notable dental hx.    Pulmonary neg pulmonary ROS,    Pulmonary exam normal breath sounds clear to auscultation       Cardiovascular negative cardio ROS Normal cardiovascular exam Rhythm:Regular Rate:Normal     Neuro/Psych negative neurological ROS  negative psych ROS   GI/Hepatic negative GI ROS, Neg liver ROS,   Endo/Other  diabetes  Renal/GU negative Renal ROS  negative genitourinary   Musculoskeletal negative musculoskeletal ROS (+)   Abdominal   Peds negative pediatric ROS (+)  Hematology negative hematology ROS (+)   Anesthesia Other Findings   Reproductive/Obstetrics negative OB ROS                             Anesthesia Physical Anesthesia Plan  ASA: II  Anesthesia Plan: General   Post-op Pain Management:    Induction: Intravenous  Airway Management Planned: Oral ETT  Additional Equipment:   Intra-op Plan:   Post-operative Plan: Extubation in OR  Informed Consent: I have reviewed the patients History and Physical, chart, labs and discussed the procedure including the risks, benefits and alternatives for the proposed anesthesia with the patient or authorized representative who has indicated his/her understanding and acceptance.   Dental advisory given  Plan Discussed with: CRNA and Surgeon  Anesthesia Plan Comments:         Anesthesia Quick Evaluation

## 2015-06-24 NOTE — Anesthesia Procedure Notes (Signed)
Procedure Name: Intubation Date/Time: 06/24/2015 8:35 AM Performed by: Julian Reil Pre-anesthesia Checklist: Patient identified, Emergency Drugs available, Suction available and Patient being monitored Patient Re-evaluated:Patient Re-evaluated prior to inductionOxygen Delivery Method: Circle system utilized Preoxygenation: Pre-oxygenation with 100% oxygen Intubation Type: IV induction Ventilation: Mask ventilation without difficulty Laryngoscope Size: Mac and 4 Grade View: Grade III Tube type: Oral Tube size: 7.5 mm Number of attempts: 1 Airway Equipment and Method: Stylet Placement Confirmation: ETT inserted through vocal cords under direct vision,  positive ETCO2 and breath sounds checked- equal and bilateral Secured at: 22 cm Tube secured with: Tape Dental Injury: Teeth and Oropharynx as per pre-operative assessment  Comments: Easy mask. Anterior airway, view of epiglottis only. Cricoid pressure slightly improved view, atraumatic oral intubation.

## 2015-06-24 NOTE — Transfer of Care (Signed)
Immediate Anesthesia Transfer of Care Note  Patient: Gabriel Erickson  Procedure(s) Performed: Procedure(s): LAPAROSCOPIC PARTIAL COLECTOMY (N/A)  Patient Location: PACU  Anesthesia Type:General  Level of Consciousness: awake, alert , oriented and patient cooperative  Airway & Oxygen Therapy: Patient Spontanous Breathing and Patient connected to nasal cannula oxygen  Post-op Assessment: Report given to RN, Post -op Vital signs reviewed and stable and Patient moving all extremities  Post vital signs: Reviewed and stable  Last Vitals:  Filed Vitals:   06/24/15 0646  BP: 166/72  Pulse: 92  Temp: 36.6 C  Resp: 18    Complications: No apparent anesthesia complications

## 2015-06-24 NOTE — H&P (Signed)
H&P   GIOVONI HEFFERON (MR# [id number])      H&P Info    Author Note Status Last Update User Last Update Date/Time   Erroll Luna, MD Signed Erroll Luna, MD 05/11/2015 1:09 PM    H&P    Expand All Collapse All   [name] A. Lingerfelt 05/11/2015 9:32 AM Location: [facility] Patient #: [id number] DOB: 06-19-1941 Married / Language: English / Race: White Male  History of Present Illness Marcello Moores A. Breven Guidroz MD; 05/11/2015 1:07 PM) Patient words: Patient sent at the request of Dr. Havery Moros for a ascending colon cancer. The patient had abdominal bloating, excessive gas excessive abdominal fullness after meals. A CT scan was done which showed some thickening of the ascending colon and a subsequent colonoscopy was done which showed a partially obstructing ascending colon mass core biopsy proven to be adenocarcinoma. He had some benign polyps removed from his transverse colon. He denies any blood in his stool. He is able to eat and drink liquids and does have bowel movements but does suffer from bouts of constipation. No vomiting.              CLINICAL DATA: Postprandial epigastric pain, lower abdominal pain EXAM: CT ABDOMEN AND PELVIS WITH CONTRAST TECHNIQUE: Multidetector CT imaging of the abdomen and pelvis was performed using the standard protocol following bolus administration of intravenous contrast. CONTRAST: 127mL OMNIPAQUE IOHEXOL 300 MG/ML SOLN COMPARISON: 07/22/2008 FINDINGS: Lung bases are unremarkable. Sagittal images of the spine shows degenerative changes thoracolumbar spine. Again noted fatty infiltration of the liver. No calcified gallstones are noted within gallbladder. 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 No focal pancreatic mass. The spleen and adrenal glands are unremarkable. Abdominal aorta is unremarkable. Kidneys are symmetrical in size and enhancement. No hydronephrosis or hydroureter.  There is no small bowel obstruction. No ascites or free air. Moderate stool noted in transverse colon. There is no pericecal inflammation. A low lying cecum is noted. The patient is status post appendectomy. In axial image 44 there is focal thickening of colonic wall in mid right colon. Mild stranding of surrounding fat. Findings could be due to focal colitis. A neoplastic process at this level cannot be excluded. Follow-up colonoscopy is recommended to exclude colonic malignancy. There is a mesenteric lymph node just medial to right colon axial image 42 measures 7 mm. Adenopathy cannot be excluded. Colonic diverticula are noted in sigmoid colon. No evidence of acute diverticulitis. Mild enlarged prostate gland with indentation of urinary bladder base. IMPRESSION: 1. In axial image 41 and 44 there is focal thickening of colonic wall in mid right colon. Mild stranding of surrounding fat. Findings are suspicious for focal colitis or diverticulitis. A neoplastic process at this level cannot be excluded. Follow-up colonoscopy is recommended to exclude colonic malignancy. There is a mesenteric lymph node just medial to right colon axial image 42 measures 7 mm. Adenopathy cannot be excluded. Best seen in coronal image 63. 2. Mild enlarged prostate gland with indentation of urinary bladder base. 3. There is a low lying cecum. No pericecal inflammation. Status post appendectomy. 4. No small bowel obstruction. 5. Degenerative changes thoracolumbar spine. 6. Colonic diverticula are noted sigmoid colon. No evidence of acute diverticulitis. Electronically Signed By: Lahoma Crocker M.D. On: 03/27/2015 17:03      Diagnosis 1. Stomach, biopsy, gastric anturm and body - REACTIVE GASTROPATHY (ANTRAL MUCOSA) - WARTHIN STARRY STAIN NEGATIVE FOR H. PYLORI 2. Colon, biopsy, ascending - INVASIVE ADENOCARCINOMA, SEE COMMENT. 3. Colon,  biopsy, transverse, rectal, polyp(2) - SESSILE SERRATED POLYP (X1); NEGATIVE FOR  CYTOLOGIC DYSPLASIA. - TUBULAR ADENOMA (X1); NEGATIVE FOR HIGH GRADE DYSPLASIA OR MALIGNANCY. Microscopic Comment 2. The adenocarcinoma is associated with low and high grade adenomatous dysplasia. This part was reviewed by Dr. Avis Epley who concurs. The case was discussed with Dr. Havery Moros on 05/08/2015. Mali RUND DO Pathologist, Electronic Signature (Case.  The patient is a 74 year old male.   Other Problems Elbert Ewings, CMA; 05/11/2015 9:32 AM) Arthritis Back Pain Colon Cancer Diabetes Mellitus Diverticulosis Enlarged Prostate Gastroesophageal Reflux Disease Hemorrhoids  Past Surgical History Elbert Ewings, CMA; 05/11/2015 9:32 AM) Appendectomy Colon Polyp Removal - Colonoscopy Colon Polyp Removal - Open Hemorrhoidectomy Spinal Surgery - Lower Back  Allergies Elbert Ewings, CMA; 05/11/2015 9:34 AM) Ciprofloxacin *CHEMICALS* Influenza Vac Split High-Dose *VACCINES* MetFORMIN HCl *CHEMICALS* Penicillin V *PENICILLINS* Rosuvastatin Calcium *ANTIHYPERLIPIDEMICS*  Medication History Elbert Ewings, CMA; 05/11/2015 9:37 AM) Glimepiride (4MG  Tablet, Oral) Active. RABEprazole Sodium (20MG  Tablet DR, Oral) Active. Sulfamethoxazole-Trimethoprim (800-160MG  Tablet, Oral) Active. Dolophine (10MG  Tablet, Oral) Active. Triamcinolone Acetonide (0.5% Cream, External) Active. MiraLax (Oral) Active. Cholecalciferol (1000UNIT Capsule, Oral) Active. Valium (5MG  Tablet, Oral) Active. Ferrous Sulfate (325 (65 Fe)MG Tablet DR, Oral) Active. Glucose Monitoring Supplies Active. Medications Reconciled  Social History Elbert Ewings, [place]; 05/11/2015 9:32 AM) Caffeine use Carbonated beverages, Coffee. No drug use Tobacco use Never smoker.  Family History Elbert Ewings, [place]; 05/11/2015 9:32 AM) Cancer Sister. Diabetes Mellitus Father. Hypertension Mother. Ovarian Cancer Sister.     Review of Systems Elbert Ewings CMA; 05/11/2015 9:32 AM) General  Present- Appetite Loss, Fatigue and Weight Loss. Not Present- Chills, Fever, Night Sweats and Weight Gain. Skin Present- Dryness and Non-Healing Wounds. Not Present- Change in Wart/Mole, Hives, Jaundice, New Lesions, Rash and Ulcer. HEENT Present- Hearing Loss, Seasonal Allergies and Wears glasses/contact lenses. Not Present- Earache, Hoarseness, Nose Bleed, Oral Ulcers, Ringing in the Ears, Sinus Pain, Sore Throat, Visual Disturbances and Yellow Eyes. Respiratory Present- Snoring. Not Present- Bloody sputum, Chronic Cough, Difficulty Breathing and Wheezing. Cardiovascular Present- Leg Cramps and Shortness of Breath. Not Present- Chest Pain, Difficulty Breathing Lying Down, Palpitations, Rapid Heart Rate and Swelling of Extremities. Gastrointestinal Present- Abdominal Pain, Bloating, Bloody Stool, Change in Bowel Habits, Constipation, Excessive gas, Hemorrhoids, Indigestion, Nausea and Rectal Pain. Not Present- Chronic diarrhea, Difficulty Swallowing, Gets full quickly at meals and Vomiting. Male Genitourinary Present- Frequency and Nocturia. Not Present- Blood in Urine, Change in Urinary Stream, Impotence, Painful Urination, Urgency and Urine Leakage. Musculoskeletal Present- Back Pain, Joint Pain, Joint Stiffness and Muscle Pain. Not Present- Muscle Weakness and Swelling of Extremities. Neurological Present- Headaches, Tingling, Trouble walking and Weakness. Not Present- Decreased Memory, Fainting, Numbness, Seizures and Tremor. Psychiatric Present- Change in Sleep Pattern. Not Present- Anxiety, Bipolar, Depression, Fearful and Frequent crying. Hematology Present- Easy Bruising. Not Present- Excessive bleeding, Gland problems, HIV and Persistent Infections.  Vitals Elbert Ewings CMA; 05/11/2015 9:37 AM) 05/11/2015 9:37 AM Weight: 176 lb Height: 72in Body Surface Area: 2.02 m Body Mass Index: 23.87 kg/m  Temp.: 97.51F(Temporal)  Pulse: 60 (Regular)  BP: 130/72 (Sitting, Left Arm,  Standard)      Physical Exam (Chaya Dehaan A. Jabria Loos MD; 05/11/2015 1:07 PM)  General Mental Status-Alert. General Appearance-Consistent with stated age. Hydration-Well hydrated. Voice-Normal.  Head and Neck Head-normocephalic, atraumatic with no lesions or palpable masses. Trachea-midline. Thyroid Gland Characteristics - normal size and consistency.  Eye Eyeball - Bilateral-Extraocular movements intact. Sclera/Conjunctiva - Bilateral-No scleral icterus.  Chest and Lung Exam Chest and lung exam reveals -  quiet, even and easy respiratory effort with no use of accessory muscles and on auscultation, normal breath sounds, no adventitious sounds and normal vocal resonance. Inspection Chest Wall - Normal. Back - normal.  Cardiovascular Cardiovascular examination reveals -normal heart sounds, regular rate and rhythm with no murmurs and normal pedal pulses bilaterally.  Abdomen Note: Soft nontender nondistended no rebound or guarding. Appendectomy scar noted.  Neurologic Neurologic evaluation reveals -alert and oriented x 3 with no impairment of recent or remote memory. Mental Status-Normal.  Musculoskeletal Normal Exam - Left-Upper Extremity Strength Normal and Lower Extremity Strength Normal. Normal Exam - Right-Upper Extremity Strength Normal and Lower Extremity Strength Normal.    Assessment & Plan (Fabrizzio Marcella A. 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 Risk of partial colon resection include bleeding, infection, leak of anastamosis, death, colostomy, organ injury, kidney injury, ureter injury, bladder injury, SBO, and need for other surgery. Pt agrees to proceed.  Current Plans Pt Education - [facility] Colorectal Cancer (AT): discussed with patient and provided information. Pt Education - [facility] Laparosopic Post Op HCI  (Gross) Pt Education - [facility] Colon Bowel Prep 2015 Miralax/Antibiotics Pt Education - [facility] Colectomy post-op instructions: discussed with patient and provided information. Pt Education - [facility] Laparoscopic Surgery HCI The anatomy & physiology of the digestive tract was discussed. The pathophysiology of the colon was discussed. Natural history risks without surgery was discussed. I feel the risks of no intervention will lead to serious problems that outweigh the operative risks; therefore, I recommended a partial colectomy to remove the pathology. Minimally invasive (Robotic/Laparoscopic) & open techniques were discussed.  Risks such as bleeding, infection, abscess, leak, reoperation, possible ostomy, hernia, heart attack, death, and other risks were discussed. I noted a good likelihood this will help address the problem. Goals of post-operative recovery were discussed as well. Need for adequate nutrition, daily bowel regimen and healthy physical activity, to optimize recovery was noted as well. We will work to minimize complications. Educational materials were available as well. Questions were answered. The patient expresses understanding & wishes to proceed with surgery.  Pt Education - Patient education: Colon and rectal cancer (Beyond the Basics): discussed with patient and provided information.

## 2015-06-24 NOTE — Op Note (Signed)
Laparoscopic Partial Colectomy Procedure Note  Indications: This patient presents for a laparoscopic  hemicolectomy for an partially obstructed Ascending colon mass mass of the  Colon. Biopsy showed this to be adenocarcinoma.  The patient underwent a complete mechanical and antibiotic bowel prep prior to his operation.The procedure was discussed with the patient.  Laparoscopic partial colectomy discussed with the patient as well as non operative treatments. The risks of operative management include bleeding,  Infection,  Leak of anastamosis,  Ostomy formation, open procedure,  Sepsis,  Abcess,  Hernia,  DVT,  Pulmonary complications,  Cardiovascular  complications,  Injury to ureter,  Bladder,kidney,and anesthesia risks,  And death. The patient understands.  Questions answered.   The success of the procedure is 50-85 % for treating the patients symptoms. They agree to proceed.  Pre-operative Diagnosis: mass of ascending colon  Post-operative Diagnosis: same  Surgeon: Erroll Luna A.   Assistants: OR STAFF  Anesthesia: General endotracheal anesthesia  ASA Class: 3  Procedure Details  The patient was seen in the Holding Room. The risks, benefits, complications, treatment options, and expected outcomes were discussed with the patient. The possibilities of reaction to medication, pulmonary aspiration, perforation of viscus, bleeding, recurrent infection, finding a normal colon, the need for additional procedures, failure to diagnose a condition, and creating a complication requiring transfusion or operation were discussed with the patient. The patient concurred with the proposed plan, giving informed consent.   The patient was taken to Operating Room # 1, identified as Gabriel Erickson and the procedure verified as partial colectomy. A Time Out was held and the above information confirmed.  The patient was brought to the operating room and placed supine. After induction of a general anesthetic, a  Foley catheter was inserted and the abdomen was prepped and draped in standard fashion. 5 MM optiview port place left mid abdomen and advanced under direct vision through the abdominal wall layers into the abdomen without difficulty.  The laparoscope was introduced.    Exploration revealed a normal omentum, colon, small bowel, peritoneum, liver, and stomach. A left superior and inferior   5-mm trocar were then placed. The ascending  colon and hepatic flexure were then mobilized with gentle retraction of the colon in a medial direction with mobilization of the peritoneal reflection with cautery and the harmonic scalpel. Mobilization of this area was complete to expose the retroperitoneum. Duodenum identified and preserved.  The ureter was identified during this mobilization process but no structures were divided during this mobilization. There was no blood loss during this portion of the procedure.      The mobilization continued to include the hepatic  flexure with the harmonic scalpel in a bloodless field. After completing mobilization, midline incision made and wound protector placed. and the colon was delivered through the incision. The colon was resected with a linear stapling device proximal and distal to the area in question in regard to the specimen. The mesenteric vessels were clamped and ligated with Ligasure. . The specimen was submitted to pathology as cecum and right colon     An end-to-end anastomosis was performed through the small anterior incision with the linear stapling device. The mucosa was inspected and found to be hemostatic. Closure was achieved with the linear stapling device TA 60. A 3-0 vicryl  suture was used to reapproximate the angle of the anastomosis. Hemostasis was confirmed. The bowel anastomosis was returned and the incision was then closed with a running #1 PDS suture.  The soft tissue was irrigated  and the incisions were closed with STAPLES closure. The 5 mm scope was re  introduced.  The anastomosis was in the right upper quadrant and had no tension.  No sign of bleeding and no evidence of colon or small bowel injury.  The ports were removed and port sites closed with staples.   Instrument, sponge, and needle counts were correct prior to abdominal closure and at the conclusion of the case.   Findings: ascending colon mass partially obstructing  Estimated Blood Loss: less than 50 mL         Drains: none         Total IV Fluids: 1500 mL         Specimens: cecum and right colon         Complications: None; patient tolerated the procedure well.         Disposition: PACU - hemodynamically stable.         Condition: stable

## 2015-06-25 ENCOUNTER — Encounter (HOSPITAL_COMMUNITY): Payer: Self-pay | Admitting: Surgery

## 2015-06-25 LAB — GLUCOSE, CAPILLARY
GLUCOSE-CAPILLARY: 172 mg/dL — AB (ref 65–99)
GLUCOSE-CAPILLARY: 185 mg/dL — AB (ref 65–99)
Glucose-Capillary: 111 mg/dL — ABNORMAL HIGH (ref 65–99)
Glucose-Capillary: 111 mg/dL — ABNORMAL HIGH (ref 65–99)
Glucose-Capillary: 129 mg/dL — ABNORMAL HIGH (ref 65–99)
Glucose-Capillary: 138 mg/dL — ABNORMAL HIGH (ref 65–99)
Glucose-Capillary: 163 mg/dL — ABNORMAL HIGH (ref 65–99)

## 2015-06-25 LAB — BASIC METABOLIC PANEL
Anion gap: 5 (ref 5–15)
BUN: 7 mg/dL (ref 6–20)
CHLORIDE: 108 mmol/L (ref 101–111)
CO2: 25 mmol/L (ref 22–32)
Calcium: 8.1 mg/dL — ABNORMAL LOW (ref 8.9–10.3)
Creatinine, Ser: 0.76 mg/dL (ref 0.61–1.24)
GFR calc non Af Amer: >60 mL/min (ref >60)
Glucose, Bld: 149 mg/dL — ABNORMAL HIGH (ref 65–99)
Potassium: 4.2 mmol/L (ref 3.5–5.1)
SODIUM: 138 mmol/L (ref 135–145)

## 2015-06-25 LAB — CBC
HEMATOCRIT: 31.1 % — AB (ref 39.0–52.0)
Hemoglobin: 9.3 g/dL — ABNORMAL LOW (ref 13.0–17.0)
MCH: 23.4 pg — ABNORMAL LOW (ref 26.0–34.0)
MCHC: 29.9 g/dL — ABNORMAL LOW (ref 30.0–36.0)
MCV: 78.3 fL (ref 78.0–100.0)
Platelets: 261 10*3/uL (ref 150–400)
RBC: 3.97 MIL/uL — AB (ref 4.22–5.81)
RDW: 23.5 % — ABNORMAL HIGH (ref 11.5–15.5)
WBC: 8.1 10*3/uL (ref 4.0–10.5)

## 2015-06-25 MED ORDER — WHITE PETROLATUM GEL
Status: AC
  Administered 2015-06-25: 0.2
  Filled 2015-06-25: qty 1

## 2015-06-25 MED ORDER — PANTOPRAZOLE SODIUM 40 MG IV SOLR
40.0000 mg | INTRAVENOUS | Status: DC
  Administered 2015-06-25 – 2015-06-28 (×4): 40 mg via INTRAVENOUS
  Filled 2015-06-25 (×4): qty 40

## 2015-06-25 MED ORDER — CETYLPYRIDINIUM CHLORIDE 0.05 % MT LIQD
7.0000 mL | Freq: Two times a day (BID) | OROMUCOSAL | Status: DC
  Administered 2015-06-26: 7 mL via OROMUCOSAL

## 2015-06-25 MED ORDER — CHLORHEXIDINE GLUCONATE 0.12 % MT SOLN
15.0000 mL | Freq: Two times a day (BID) | OROMUCOSAL | Status: DC
  Administered 2015-06-25 – 2015-06-26 (×2): 15 mL via OROMUCOSAL
  Filled 2015-06-25 (×2): qty 15

## 2015-06-25 NOTE — Care Management Note (Signed)
Case Management Note  Patient Details  Name: DAXTEN CARDONI MRN: [id number] Date of Birth: 03/19/1941  Subjective/Objective:                    Action/Plan:  UR updated  Expected Discharge Date:                  Expected Discharge Plan:  Home/Self Care  In-House Referral:     Discharge planning Services     Post Acute Care Choice:    Choice offered to:     DME Arranged:    DME Agency:     HH Arranged:    [place] Agency:     Status of Service:  In process, will continue to follow  Medicare Important Message Given:    Date Medicare IM Given:    Medicare IM give by:    Date Additional Medicare IM Given:    Additional Medicare Important Message give by:     If discussed at [place] of Stay Meetings, dates discussed:    Additional Comments:  Marilu Favre, RN 06/25/2015, 7:35 AM

## 2015-06-25 NOTE — Progress Notes (Signed)
1 Day Post-Op  Subjective: SOME NAUSEA THIS AM  Objective: Vital signs in last 24 hours: Temp:  [97.8 F (36.6 C)-99.1 F (37.3 C)] 97.8 F (36.6 C) (12/08 0502) Pulse Rate:  [79-92] 79 (12/08 0502) Resp:  [14-31] 17 (12/08 0502) BP: (117-149)/(62-73) 144/63 mmHg (12/08 0502) SpO2:  [94 %-100 %] 94 % (12/08 0502) Last BM Date: 06/24/15  Intake/Output from previous day: 12/07 0701 - 12/08 0700 In: 3179 [P.O.:240; I.V.:2539; IV Piggyback:400] Out: 1010 [Urine:990; Blood:20] Intake/Output this shift:    Incision/Wound:CDI  SOFT BS PRESENT NO PERITONITIS   Lab Results:   Recent Labs  06/25/15 0554  WBC 8.1  HGB 9.3*  HCT 31.1*  PLT 261   BMET  Recent Labs  06/25/15 0554  NA 138  K 4.2  CL 108  CO2 25  GLUCOSE 149*  BUN 7  CREATININE 0.76  CALCIUM 8.1*   PT/INR No results for input(s): LABPROT, INR in the last 72 hours. ABG No results for input(s): PHART, HCO3 in the last 72 hours.  Invalid input(s): PCO2, PO2  Studies/Results: No results found.  Anti-infectives: Anti-infectives    Start     Dose/Rate Route Frequency Ordered Stop   06/24/15 0638  cefTRIAXone (ROCEPHIN) 2 g in dextrose 5 % 50 mL IVPB     2 g 100 mL/hr over 30 Minutes Intravenous On call to O.R. 06/24/15 [id number] 06/24/15 0841   06/24/15 [id number]  metroNIDAZOLE (FLAGYL) IVPB 500 mg     500 mg 100 mL/hr over 60 Minutes Intravenous On call to O.R. 06/24/15 0638 06/24/15 0845      Assessment/Plan: s/p Procedure(s): LAPAROSCOPIC PARTIAL COLECTOMY (N/A) OOB AMBULATE  KEEP ON ICE UNTIL BOWEL FUNCTION RETURNS   LOS: 1 day    Marionna Gonia A. 06/25/2015

## 2015-06-26 LAB — GLUCOSE, CAPILLARY
GLUCOSE-CAPILLARY: 143 mg/dL — AB (ref 65–99)
GLUCOSE-CAPILLARY: 190 mg/dL — AB (ref 65–99)
GLUCOSE-CAPILLARY: 157 mg/dL — AB (ref 65–99)
Glucose-Capillary: 145 mg/dL — ABNORMAL HIGH (ref 65–99)
Glucose-Capillary: 152 mg/dL — ABNORMAL HIGH (ref 65–99)

## 2015-06-26 MED ORDER — HYDROMORPHONE HCL 1 MG/ML IJ SOLN
1.0000 mg | INTRAMUSCULAR | Status: DC | PRN
  Administered 2015-06-28 (×2): 1 mg via INTRAVENOUS
  Filled 2015-06-26 (×3): qty 1

## 2015-06-26 NOTE — Progress Notes (Signed)
2 Days Post-Op  Subjective: feels better  No flatus No vomiting Objective: Vital signs in last 24 hours: Temp:  [98.1 F (36.7 C)-98.3 F (36.8 C)] 98.3 F (36.8 C) (12/09 0619) Pulse Rate:  [97-98] 98 (12/09 0619) Resp:  [16-20] 16 (12/09 0813) BP: (153-165)/(69-71) 153/71 mmHg (12/09 0619) SpO2:  [96 %-100 %] 97 % (12/09 0813) Last BM Date: 06/23/15  Intake/Output from previous day: 12/08 0701 - 12/09 0700 In: 120 [P.O.:120] Out: 865 [Urine:865] Intake/Output this shift: Total I/O In: -  Out: 540 [Urine:540]  Incision/Wound:CDI  Dressing in place   Soft  Slight distention   Lab Results:   Recent Labs  06/25/15 0554  WBC 8.1  HGB 9.3*  HCT 31.1*  PLT 261   BMET  Recent Labs  06/25/15 0554  NA 138  K 4.2  CL 108  CO2 25  GLUCOSE 149*  BUN 7  CREATININE 0.76  CALCIUM 8.1*   PT/INR No results for input(s): LABPROT, INR in the last 72 hours. ABG No results for input(s): PHART, HCO3 in the last 72 hours.  Invalid input(s): PCO2, PO2  Studies/Results: No results found.  Anti-infectives: Anti-infectives    Start     Dose/Rate Route Frequency Ordered Stop   06/24/15 [id number]  cefTRIAXone (ROCEPHIN) 2 g in dextrose 5 % 50 mL IVPB     2 g 100 mL/hr over 30 Minutes Intravenous On call to O.R. 06/24/15 [id number] 06/24/15 0841   06/24/15 [id number]  metroNIDAZOLE (FLAGYL) IVPB 500 mg     500 mg 100 mL/hr over 60 Minutes Intravenous On call to O.R. 06/24/15 [id number] 06/24/15 0845      Assessment/Plan: s/p Procedure(s): LAPAROSCOPIC PARTIAL COLECTOMY (N/A) Clears D/C PCA OOB Stage 3    Discussed with patient     LOS: 2 days    Kajsa Butrum A. 06/26/2015

## 2015-06-26 NOTE — Plan of Care (Signed)
Problem: Nutrition: Goal: Adequate nutrition will be maintained Outcome: Progressing Advanced to clear liquid today and tolerating well.

## 2015-06-27 LAB — GLUCOSE, CAPILLARY
GLUCOSE-CAPILLARY: 157 mg/dL — AB (ref 65–99)
Glucose-Capillary: 119 mg/dL — ABNORMAL HIGH (ref 65–99)
Glucose-Capillary: 129 mg/dL — ABNORMAL HIGH (ref 65–99)
Glucose-Capillary: 145 mg/dL — ABNORMAL HIGH (ref 65–99)
Glucose-Capillary: 151 mg/dL — ABNORMAL HIGH (ref 65–99)
Glucose-Capillary: 157 mg/dL — ABNORMAL HIGH (ref 65–99)
Glucose-Capillary: 167 mg/dL — ABNORMAL HIGH (ref 65–99)

## 2015-06-27 MED ORDER — ALUM & MAG HYDROXIDE-SIMETH 200-200-20 MG/5ML PO SUSP
30.0000 mL | ORAL | Status: DC | PRN
  Administered 2015-06-27 – 2015-06-28 (×2): 30 mL via ORAL
  Filled 2015-06-27 (×3): qty 30

## 2015-06-27 NOTE — Progress Notes (Signed)
3 Days Post-Op  Subjective: Had 3 BMs, not taking much PO, req Maalox  Objective: Vital signs in last 24 hours: Temp:  [98.1 F (36.7 C)-98.9 F (37.2 C)] 98.1 F (36.7 C) (12/10 0538) Pulse Rate:  [76-84] 76 (12/10 0538) Resp:  [15-16] 15 (12/10 0538) BP: (148-169)/(67-75) 164/75 mmHg (12/10 0538) SpO2:  [98 %-100 %] 98 % (12/10 0538) Last BM Date: 06/23/15  Intake/Output from previous day: 12/09 0701 - 12/10 0700 In: 1550.5 [P.O.:187; I.V.:1363.5] Out: 1341 [Urine:1340; Stool:1] Intake/Output this shift:    General appearance: alert and cooperative GI: soft, moderate distention, active BS, port incisions CDI, midline dressing in place  Lab Results:   Recent Labs  06/25/15 0554  WBC 8.1  HGB 9.3*  HCT 31.1*  PLT 261   BMET  Recent Labs  06/25/15 0554  NA 138  K 4.2  CL 108  CO2 25  GLUCOSE 149*  BUN 7  CREATININE 0.76  CALCIUM 8.1*   PT/INR No results for input(s): LABPROT, INR in the last 72 hours. ABG No results for input(s): PHART, HCO3 in the last 72 hours.  Invalid input(s): PCO2, PO2  Studies/Results: No results found.  Anti-infectives: Anti-infectives    Start     Dose/Rate Route Frequency Ordered Stop   06/24/15 [phone]  cefTRIAXone (ROCEPHIN) 2 g in dextrose 5 % 50 mL IVPB     2 g 100 mL/hr over 30 Minutes Intravenous On call to O.R. 06/24/15 [id number] 06/24/15 0841   06/24/15 [id number]  metroNIDAZOLE (FLAGYL) IVPB 500 mg     500 mg 100 mL/hr over 60 Minutes Intravenous On call to O.R. 06/24/15 [id number] 06/24/15 0845      Assessment/Plan: s/p Procedure(s): LAPAROSCOPIC PARTIAL COLECTOMY (N/A) POD3 Bowel function returning but still distended - try fulls, on entereg Heartburn - add Maalox Decrease IVF I also spoke with his wife  LOS: 3 days    Devony Mcgrady E 06/27/2015

## 2015-06-28 LAB — GLUCOSE, CAPILLARY
GLUCOSE-CAPILLARY: 113 mg/dL — AB (ref 65–99)
GLUCOSE-CAPILLARY: 144 mg/dL — AB (ref 65–99)
GLUCOSE-CAPILLARY: 167 mg/dL — AB (ref 65–99)
GLUCOSE-CAPILLARY: 167 mg/dL — AB (ref 65–99)
Glucose-Capillary: 142 mg/dL — ABNORMAL HIGH (ref 65–99)

## 2015-06-28 NOTE — Progress Notes (Signed)
4 Days Post-Op  Subjective: Having flatus, bms, pain in back from sitting  Objective: Vital signs in last 24 hours: Temp:  [97.5 F (36.4 C)-98.6 F (37 C)] 97.5 F (36.4 C) (12/11 0550) Pulse Rate:  [65-67] 65 (12/11 0550) Resp:  [16-17] 17 (12/11 0550) BP: (147-177)/(65-89) 156/89 mmHg (12/11 0700) SpO2:  [99 %-100 %] 99 % (12/11 0550) Last BM Date: 06/27/15  Intake/Output from previous day: 12/10 0701 - 12/11 0700 In: 545 [P.O.:120; I.V.:425] Out: 550 [Urine:550] Intake/Output this shift: Total I/O In: -  Out: 200 [Urine:200]  Resp: clear to auscultation bilaterally Cardio: regular rate and rhythm GI: soft approp tender incisions clean bs present   Anti-infectives: Anti-infectives    Start     Dose/Rate Route Frequency Ordered Stop   06/24/15 [phone]  cefTRIAXone (ROCEPHIN) 2 g in dextrose 5 % 50 mL IVPB     2 g 100 mL/hr over 30 Minutes Intravenous On call to O.R. 06/24/15 [id number] 06/24/15 0841   06/24/15 [id number]  metroNIDAZOLE (FLAGYL) IVPB 500 mg     500 mg 100 mL/hr over 60 Minutes Intravenous On call to O.R. 06/24/15 0638 06/24/15 0845      Assessment/Plan: Pod 4 lap colectomy  Oral pain meds with iv backup pulm toilet Wants to stay at fulls for now but will adat lovenox scds May be able to go home tomorrow     [facility] 06/28/2015

## 2015-06-28 NOTE — Progress Notes (Signed)
Pt BP was 160/65, 177/68 rechecked 156/89, asymptomatic, paged Dr Serita Grammes if he wants to give lopressor, waiting for his return call endorsed to day nurse

## 2015-06-28 NOTE — Progress Notes (Signed)
BP elevated slightly.  Pt not on BP meds at home.  Suspect it is from pain.  Medicated with Dilaudid as pt states he is sore.  Will recheck BP.

## 2015-06-29 LAB — GLUCOSE, CAPILLARY
GLUCOSE-CAPILLARY: 110 mg/dL — AB (ref 65–99)
GLUCOSE-CAPILLARY: 136 mg/dL — AB (ref 65–99)
Glucose-Capillary: 133 mg/dL — ABNORMAL HIGH (ref 65–99)

## 2015-06-29 MED ORDER — ONDANSETRON HCL 4 MG PO TABS: 4.0000 mg | ORAL_TABLET | Freq: Four times a day (QID) | ORAL | Status: DC | PRN

## 2015-06-29 MED ORDER — OXYCODONE-ACETAMINOPHEN 5-325 MG PO TABS: 1.0000 | ORAL_TABLET | ORAL | Status: DC | PRN

## 2015-06-29 NOTE — Care Management Important Message (Signed)
Important Message  Patient Details  Name: Gabriel Erickson MRN: [id number] Date of Birth: January 16, 1941   Medicare Important Message Given:  Yes    [name] [name] 06/29/2015, 3:35 PM

## 2015-06-29 NOTE — Discharge Summary (Signed)
Physician Discharge Summary  Patient ID: Gabriel Erickson MRN: [id number] DOB/AGE: 1940-12-20 74 y.o.  Admit date: 06/24/2015 Discharge date: 06/29/2015  Admission Diagnoses:colon cancer  Discharge Diagnoses:  Active Problems:   Colon cancer [facility])   Discharged Condition: good  Hospital Course: Pt did well.  Bowels moved POD 4 and diet advanced.  Tolerated   Diet.  Wounds clean,  Vitals stable.  D/C POD 5   Consults: None    Treatments: surgery: partial colectomy laparoscopic assisted for right colon cancer stage 3   Discharge Exam: Blood pressure 153/67, pulse 74, temperature 98.8 F (37.1 C), temperature source Oral, resp. rate 17, height 6' (1.829 m), weight 79.969 kg (176 lb 4.8 oz), SpO2 97 %. General appearance: alert and cooperative Resp: clear to auscultation bilaterally Incision/Wound:CDI soft  nd no peritonitis  Disposition: Final discharge disposition not confirmed     Medication List    ASK your doctor about these medications        ACCU-CHEK SOFTCLIX LANCETS lancets  by Other route 2 (two) times daily. Use as instructed     b complex vitamins tablet  Take 1 tablet by mouth daily.     Cholecalciferol 1000 UNITS tablet  Take 1,000 Units by mouth daily.     diazepam 5 MG tablet  Commonly known as:  VALIUM  Take 1 tablet (5 mg total) by mouth 2 (two) times daily as needed for muscle spasms (spasms). For cramps or insomnia     ferrous sulfate 325 (65 FE) MG tablet  Take 325 mg by mouth daily with breakfast.     fluticasone 50 MCG/ACT nasal spray  Commonly known as:  FLONASE  Place 2 sprays into the nose daily.     glimepiride 4 MG tablet  Commonly known as:  AMARYL  TAKE 1 TABLET TWICE DAILY     methadone 10 MG tablet  Commonly known as:  DOLOPHINE  Take 2 tablets (20 mg total) by mouth 4 (four) times daily as needed for severe pain. For pain - FILL ON OR AFTER 08/19/2015     metroNIDAZOLE 500 MG tablet  Commonly known as:  FLAGYL  TAKE TWO TABLETS  BY MOUTH AT 2PM, 3PM AND 10PM THE DAY PRIOR TO COLON OPERATION     neomycin 500 MG tablet  Commonly known as:  MYCIFRADIN  TAKE TWO TABLETS BY MOUTH AT 2PM, 3PM AND 10 PM THE DAY PRIOR TO SURGERY     ONE TOUCH ULTRA TEST test strip  Generic drug:  glucose blood  1 each by Other route 2 (two) times daily. Dx 250.00 fluctuating blood sugar     polyethylene glycol powder powder  Commonly known as:  GLYCOLAX/MIRALAX  Take 17 g by mouth 3 (three) times daily.     pseudoephedrine-guaifenesin 60-600 MG 12 hr tablet  Commonly known as:  MUCINEX D  Take 1 tablet by mouth every 12 (twelve) hours as needed for congestion.     RABEprazole 20 MG tablet  Commonly known as:  ACIPHEX  Take 1 tablet (20 mg total) by mouth 2 (two) times daily.     triamcinolone cream 0.5 %  Commonly known as:  KENALOG  Apply topically 2 (two) times daily as needed.           Follow-up Information    Follow up with Landis Cassaro A., MD In 1 week.   Specialty:  General Surgery   Contact information:   [name] [place] [address] [phone]  Signed: Klea Nall A. 06/29/2015, 8:57 AM

## 2015-06-29 NOTE — Progress Notes (Signed)
5 Days Post-Op  Subjective: Tolerating diet bowels moving   Objective: Vital signs in last 24 hours: Temp:  [97.7 F (36.5 C)-98.8 F (37.1 C)] 98.8 F (37.1 C) (12/12 0428) Pulse Rate:  [71-76] 74 (12/12 0428) Resp:  [17-18] 17 (12/12 0428) BP: (153-168)/(64-70) 153/67 mmHg (12/12 0428) SpO2:  [97 %-100 %] 97 % (12/12 0428) Last BM Date: 06/28/15  Intake/Output from previous day: 12/11 0701 - 12/12 0700 In: 2840 [P.O.:1540; I.V.:1300] Out: 1450 [Urine:1450] Intake/Output this shift:    Incision/Wound:CDI soft ND   Lab Results:  No results for input(s): WBC, HGB, HCT, PLT in the last 72 hours. BMET No results for input(s): NA, K, CL, CO2, GLUCOSE, BUN, CREATININE, CALCIUM in the last 72 hours. PT/INR No results for input(s): LABPROT, INR in the last 72 hours. ABG No results for input(s): PHART, HCO3 in the last 72 hours.  Invalid input(s): PCO2, PO2  Studies/Results: No results found.  Anti-infectives: Anti-infectives    Start     Dose/Rate Route Frequency Ordered Stop   06/24/15 0638  cefTRIAXone (ROCEPHIN) 2 g in dextrose 5 % 50 mL IVPB     2 g 100 mL/hr over 30 Minutes Intravenous On call to O.R. 06/24/15 [id number] 06/24/15 0841   06/24/15 [id number]  metroNIDAZOLE (FLAGYL) IVPB 500 mg     500 mg 100 mL/hr over 60 Minutes Intravenous On call to O.R. 06/24/15 [id number] 06/24/15 0845      Assessment/Plan: s/p Procedure(s): LAPAROSCOPIC PARTIAL COLECTOMY (N/A) Discharge  LOS: 5 days    Gabriel Erickson A. 06/29/2015

## 2015-06-29 NOTE — Progress Notes (Signed)
Discharge instructions reviewed with pt and prescription given.  Pt verbalized understanding and had no questions.

## 2015-06-29 NOTE — Discharge Instructions (Signed)
[facility]      [facility] [phone]  OPEN ABDOMINAL SURGERY: POST OP INSTRUCTIONS  Always review your discharge instruction sheet given to you by the facility where your surgery was performed.  IF YOU HAVE DISABILITY OR FAMILY LEAVE FORMS, YOU MUST BRING THEM TO THE OFFICE FOR PROCESSING.  PLEASE DO NOT GIVE THEM TO YOUR DOCTOR.  1. A prescription for pain medication may be given to you upon discharge.  Take your pain medication as prescribed, if needed.  If narcotic pain medicine is not needed, then you may take acetaminophen (Tylenol) or ibuprofen (Advil) as needed. 2. Take your usually prescribed medications unless otherwise directed. 3. If you need a refill on your pain medication, please contact your pharmacy. They will contact our office to request authorization.  Prescriptions will not be filled after 5pm or on week-ends. 4. You should follow a light diet the first few days after arrival home, such as soup and crackers, pudding, etc.unless your doctor has advised otherwise. A high-fiber, low fat diet can be resumed as tolerated.   Be sure to include lots of fluids daily. Most patients will experience some swelling and bruising on the chest and neck area.  Ice packs will help.  Swelling and bruising can take several days to resolve 5. Most patients will experience some swelling and bruising in the area of the incision. Ice pack will help. Swelling and bruising can take several days to resolve..  6. It is common to experience some constipation if taking pain medication after surgery.  Increasing fluid intake and taking a stool softener will usually help or prevent this problem from occurring.  A mild laxative (Milk of Magnesia or Miralax) should be taken according to package directions if there are no bowel movements after 48 hours. 7.  You may have steri-strips (small skin tapes) in place directly over the incision.  These strips should be left on the skin for 7-10 days.  If your  surgeon used skin glue on the incision, you may shower in 24 hours.  The glue will flake off over the next 2-3 weeks.  Any sutures or staples will be removed at the office during your follow-up visit. You may find that a light gauze bandage over your incision may keep your staples from being rubbed or pulled. You may shower and replace the bandage daily. 8. ACTIVITIES:  You may resume regular (light) daily activities beginning the next day--such as daily self-care, walking, climbing stairs--gradually increasing activities as tolerated.  You may have sexual intercourse when it is comfortable.  Refrain from any heavy lifting or straining until approved by your doctor. a. You may drive when you no longer are taking prescription pain medication, you can comfortably wear a seatbelt, and you can safely maneuver your car and apply brakes b. Return to Work: ___________________________________ 9. You should see your doctor in the office for a follow-up appointment approximately two weeks after your surgery.  Make sure that you call for this appointment within a day or two after you arrive home to insure a convenient appointment time. OTHER INSTRUCTIONS:  _____________________________________________________________ _____________________________________________________________  WHEN TO CALL YOUR DOCTOR: 1. Fever over 101.0 2. Inability to urinate 3. Nausea and/or vomiting 4. Extreme swelling or bruising 5. Continued bleeding from incision. 6. Increased pain, redness, or drainage from the incision. 7. Difficulty swallowing or breathing 8. Muscle cramping or spasms. 9. Numbness or tingling in hands or feet or around lips.  The clinic staff is available to   answer your questions during regular business hours.  Please don't hesitate to call and ask to speak to one of the nurses if you have concerns.  For further questions, please visit [email or web address]   

## 2015-06-30 ENCOUNTER — Telehealth: Payer: Self-pay | Admitting: *Deleted

## 2015-06-30 NOTE — Telephone Encounter (Signed)
Oncology Nurse Navigator Documentation  Oncology Nurse Navigator Flowsheets 06/30/2015  Referral date to RadOnc/MedOnc 06/30/2015  Navigator Encounter Type Introductory phone call  Referral from Dr. Brantley Stage for colon cancer. Patient informed nurse that Dr. Brantley Stage wanted him to see Dr. Benay Spice. Spoke with patient and provided new patient appointment for 07/23/15 at 1:45/2:00 with Dr. Benay Spice. Informed of location of [facility], valet service, and registration process. Reminded to bring insurance cards and a current medication list, including supplements. Patient verbalizes understanding. Welcome packet in mail.

## 2015-07-01 ENCOUNTER — Encounter (HOSPITAL_COMMUNITY): Payer: Self-pay

## 2015-07-23 ENCOUNTER — Ambulatory Visit (HOSPITAL_BASED_OUTPATIENT_CLINIC_OR_DEPARTMENT_OTHER): Payer: Medicare Other | Admitting: Oncology

## 2015-07-23 ENCOUNTER — Encounter: Payer: Self-pay | Admitting: Oncology

## 2015-07-23 ENCOUNTER — Encounter: Payer: Self-pay | Admitting: *Deleted

## 2015-07-23 ENCOUNTER — Telehealth: Payer: Self-pay | Admitting: Oncology

## 2015-07-23 VITALS — BP 151/75 | HR 91 | Temp 98.0°F | Resp 19 | Ht 72.0 in | Wt 170.6 lb

## 2015-07-23 DIAGNOSIS — C182 Malignant neoplasm of ascending colon: Secondary | ICD-10-CM

## 2015-07-23 NOTE — Progress Notes (Signed)
[facility] Patient Consult   Referring MD: Olamide Carattini Philipson 75 y.o.  08-15-1940    Reason for Referral: Colon cancer   HPI: He presented to Dr. Alain Marion with abdominal pain and was referred for a CT of the abdomen and pelvis on 03/27/2015. Focal thickening of the colonic wall was noted in the mid right colon with mild stranding in the surrounding fat. A 7 mm mesenteric node was noted medial to the right colon. The findings were suspicious for focal colitis and a neoplastic process could not be excluded. He was referred to Dr. Woodroe Mode and was taken to an upper endoscopy on 05/07/2015. A biopsy from the stomach was taken to rule out H. pylori. A mass was noted in the ascending colon. The in the scope could not traverse the mass. Multiple biopsies were obtained and a tattoo was placed. A 3 mm polyp was removed from the transverse colon. A 3-4 mm polyp was removed from the rectum. Severe diverticulosis was noted in the left colon. The pathology [id number]) revealed reactive gastropathy in the stomach with a negative H. pylori stain. The biopsy from the ascending colon confirmed invasive adenocarcinoma. The transverse and rectal polyps returned as a sessile ulcerated polyp and tubular adenoma.  A CT of the chest revealed a tiny nodule in the right upper lobe with a calcification and additional small nonspecific lung nodules. A 1.67 m hypodense right thyroid nodule was noted.  He was referred to Dr. Brantley Stage and was taken the operating room for a laparoscopic right colectomy on 06/24/2015. Exploration revealed a normal omentum, small bowel, peritoneum, liver, and stomach. A mass was noted in the ascending colon. The pathology [id number]) confirmed a moderately differentiated adenocarcinoma. Tumor invaded through the muscularis propria. The resection margins were negative. No additional polyps. 2 of 27 lymph nodes contained metastatic carcinoma. The tumor returned  microsatellite stable with preservation of mismatch repair protein expression. Lymphovascular and perineural invasion were present. No tumor deposits. No macroscopic tumor perforation.  He reports having more frequent bowel movements following surgery.  Past Medical History  Diagnosis Date  . Fatty liver   . Gallstones     Possible vs sludge  . Hypotension   . Diabetes mellitus   . Diverticulosis of colon   . GERD (gastroesophageal reflux disease)   . Hypogonadism male   . LBP (low back pain)   . Osteoarthritis   . Peripheral neuropathy ([facility])   . Constipation   . Seasonal allergies   . Urinary frequency   . Cancer (HCC)-ascending colon, stage III (T3 N1)   06/24/2015     COLON CANCER    .   Hemachromatosis-heterozygote  Past Surgical History  Procedure Laterality Date  . Lumbar laminectomy    . Appendectomy    . Foot surgery  1966    Left  . Tonsillectomy    . Hemorrhoid surgery  2011    [facility]  . Eye surgery Left   . Partial colectomy  06/24/2015  . Cataract extraction Left   . Laparoscopic partial colectomy N/A 06/24/2015    Procedure: LAPAROSCOPIC PARTIAL COLECTOMY;  Surgeon: Erroll Luna, MD;  Location: [facility];  Service: General;  Laterality: N/A;    Medications: Reviewed  Allergies:  Allergies  Allergen Reactions  . Penicillins Swelling and Palpitations  . Rosuvastatin Other (See Comments)    Muscle aches all over  . Ciprofloxacin     REACTION: ? throat discomfort  . Influenza Vaccines  Nausea And Vomiting  . Metformin And Related     n/v    Family history: His sister died of biliary tract cancer. Another sister had ovarian cancer at age 56. He had 4 siblings. A maternal uncle had colon cancer. No other family history of cancer. No children.  Social History:   He lives in [place]. He is retired Surveyor, mining. He does not use cigarettes. He drinks wine occasionally.  History  Alcohol Use  . 0.0 oz/week  . 0 Standard drinks or equivalent per week     Comment: rarely    History  Smoking status  . Never Smoker   Smokeless tobacco  . Never Used      ROS:   Positives include: Indigestion and abdominal pain prior to surgery, chronic constipation that had worsened prior to surgery, rectal bleeding prior to surgery, dry mouth  A complete ROS was otherwise negative.  Physical Exam:  Blood pressure 151/75, pulse 91, temperature 98 F (36.7 C), temperature source Oral, resp. rate 19, height 6' (1.829 m), weight 170 lb 9.6 oz (77.384 kg), SpO2 100 %.  HEENT: Oropharynx without visible mass, neck without mass Lungs: Clear bilaterally Cardiac: Regular rate and rhythm Abdomen: Healed surgical incision, no mass, no hepatosplenic the, no apparent ascites GU: Uncircumcised male, testes without mass  Vascular: No leg edema Lymph nodes: No cervical, supraclavicular, axillary, or inguinal nodes Neurologic: Alert and oriented, the motor exam appears intact in the upper and lower extremities Skin: No rash, soft mobile oval 1-2 centimeter lesion in the low left axillary line Musculoskeletal: No spine tenderness   LAB:  CBC  Lab Results  Component Value Date   WBC 8.1 06/25/2015   HGB 9.3* 06/25/2015   HCT 31.1* 06/25/2015   MCV 78.3 06/25/2015   PLT 261 06/25/2015   NEUTROABS 3.3 06/16/2015     CMP      Component Value Date/Time   NA 138 06/25/2015 0554   K 4.2 06/25/2015 0554   CL 108 06/25/2015 0554   CO2 25 06/25/2015 0554   GLUCOSE 149* 06/25/2015 0554   BUN 7 06/25/2015 0554   CREATININE 0.76 06/25/2015 0554   CALCIUM 8.1* 06/25/2015 0554   PROT 7.4 06/16/2015 1302   ALBUMIN 3.6 06/16/2015 1302   AST 23 06/16/2015 1302   ALT 19 06/16/2015 1302   ALKPHOS 88 06/16/2015 1302   BILITOT 0.5 06/16/2015 1302   GFRNONAA >60 06/25/2015 0554   GFRAA >60 06/25/2015 0554    Lab Results  Component Value Date   CEA 2.1 06/03/2015    Imaging:  As per history of present illness, chest CT 05/15/2015 and CT abdomen  03/27/2015 reviewed   Assessment/Plan:   1. Adenocarcinoma of the ascending colon, stage IIIB (T3, N1b), status post a partial colectomy 06/24/2015  2 of 27 lymph nodes contained metastatic carcinoma, lymphovascular and perineural invasion present  Microsatellite stable with no loss of mismatch repair protein expression  2.   Diabetes  3.   Hemachromatosis heterozygote  4.   Family history of colon, ovarian, and biliary tract cancers  5.   Indeterminate lung lesions on the chest CT 05/15/2015   Disposition:   Mr. Pherigo has been diagnosed with stage III colon cancer. I discussed the diagnosis, prognosis, and adjuvant treatment options with Mr. Slomski and his wife. I explained the benefit associated with adjuvant 5-fluorouracil based chemotherapy in this setting. I also discussed the expected benefit with the addition of oxaliplatin. We reviewed the potential toxicities associated  with 5-fluorouracil, capecitabine, and oxaliplatin. We discussed the FOLFOX, CAPOX, 5-FU/leucovorin, and capecitabine chemotherapy regimens.  I reviewed the controversy surrounding the use of oxaliplatin therapy in patients older than 70. He is most comfortable proceeding with single agent capecitabine.  I reviewed the potential toxicities associated with capecitabine including the chance for mucositis, diarrhea, rash, hyperpigmentation, and the hand/foot syndrome. He will attend a chemotherapy teaching class.  We discussed diet and exercise maneuvers that may decrease the risk of colon cancer. We also discussed the data suggesting a decrease in the development of new colon cancer and recurrent colon cancer in patients who use aspirin following surgery. He understands this is not yet a standard recommendation. He stated that he is unable to tolerate aspirin.  He decided to begin adjuvant capecitabine therapy. The plan is to begin a first cycle on 07/29/2015. He will return for an office and lab visit  08/17/2015.  Approximately 50 minutes were spent with the patient today. The majority of the time was used for counseling and coordination of care.    [place], [place] 07/23/2015, 6:14 PM

## 2015-07-23 NOTE — Telephone Encounter (Signed)
per pof to sch pt appt-gave pt copy of avs °

## 2015-07-23 NOTE — Progress Notes (Signed)
Oncology Nurse Navigator Documentation  Oncology Nurse Navigator Flowsheets 07/23/2015  Navigator Location [facility]-Med Onc  Navigator Encounter Type Initial MedOnc  Abnormal Finding Date 03/27/2015  Confirmed Diagnosis Date 05/07/2015  Surgery Date 06/24/2015  Patient Visit Type MedOnc  Treatment Phase Pre-Tx/Tx Discussion  Barriers/Navigation Needs Education;Family concerns  Education Actor Options;Newly Diagnosed Cancer Education;Preparing for Upcoming Surgery/ Treatment  Interventions Education Method  Education Method Verbal;Written;Teach-back  Support Groups/Services GI Support Group  Acuity Level 2  Time Spent with Patient 12  Met with patient and wife, Santiago Glad during new patient visit. Explained the role of the GI Nurse Navigator and provided New Patient Packet with information on: 1. Colon cancer w/handout on Xeloda and diarrhea management with Imodium 2. Support groups 3. Advanced Directives 4. Fall Safety Plan Answered questions, reviewed current treatment plan using TEACH back and provided emotional support. Provided copy of current treatment plan. Will follow up with oral chemo pharmacist regarding his Xeloda co pay. He would like to pick up drug on 07/28/15 when here for chemo teaching class. Patient declined information/packet from [facility].  Merceda Elks, RN, BSN GI Oncology [place]

## 2015-07-24 ENCOUNTER — Telehealth: Payer: Self-pay | Admitting: Pharmacist

## 2015-07-24 ENCOUNTER — Other Ambulatory Visit: Payer: Self-pay | Admitting: Oncology

## 2015-07-24 DIAGNOSIS — C182 Malignant neoplasm of ascending colon: Secondary | ICD-10-CM

## 2015-07-24 MED ORDER — CAPECITABINE 500 MG PO TABS: 1000.0000 mg/m2 | ORAL_TABLET | Freq: Two times a day (BID) | ORAL | Status: DC

## 2015-07-24 MED FILL — CAPECITABINE 500 MG TABLET: 500 | 21 days supply | Qty: 112 | Fill #0

## 2015-07-24 NOTE — Telephone Encounter (Signed)
07/24/2015: Rx for Xeloda sent to [facility]

## 2015-07-27 ENCOUNTER — Other Ambulatory Visit: Payer: Self-pay | Admitting: *Deleted

## 2015-07-27 DIAGNOSIS — C189 Malignant neoplasm of colon, unspecified: Secondary | ICD-10-CM

## 2015-07-27 MED ORDER — PROCHLORPERAZINE MALEATE 10 MG PO TABS: 10.0000 mg | ORAL_TABLET | Freq: Four times a day (QID) | ORAL | Status: DC | PRN

## 2015-07-27 MED FILL — PROCHLORPERAZINE 10 MG TAB: 10 | 7 days supply | Qty: 30 | Fill #0

## 2015-07-27 NOTE — Telephone Encounter (Signed)
07/27/15: Copay for Xeloda for Gabriel Erickson is $50 at [facility]. He will pick up on 07/28/15

## 2015-07-28 ENCOUNTER — Other Ambulatory Visit (HOSPITAL_BASED_OUTPATIENT_CLINIC_OR_DEPARTMENT_OTHER): Payer: Medicare Other

## 2015-07-28 ENCOUNTER — Telehealth: Payer: Self-pay | Admitting: *Deleted

## 2015-07-28 ENCOUNTER — Encounter: Payer: Self-pay | Admitting: *Deleted

## 2015-07-28 ENCOUNTER — Ambulatory Visit: Payer: Medicare Other

## 2015-07-28 ENCOUNTER — Encounter: Payer: Self-pay | Admitting: Pharmacist

## 2015-07-28 DIAGNOSIS — C182 Malignant neoplasm of ascending colon: Secondary | ICD-10-CM | POA: Diagnosis not present

## 2015-07-28 DIAGNOSIS — Z5111 Encounter for antineoplastic chemotherapy: Secondary | ICD-10-CM | POA: Insufficient documentation

## 2015-07-28 LAB — COMPREHENSIVE METABOLIC PANEL
ALT: 19 U/L (ref 0–55)
AST: 23 U/L (ref 5–34)
Albumin: 3.8 g/dL (ref 3.5–5.0)
Alkaline Phosphatase: 115 U/L (ref 40–150)
Anion Gap: 9 mEq/L (ref 3–11)
BUN: 9.2 mg/dL (ref 7.0–26.0)
CO2: 27 meq/L (ref 22–29)
CREATININE: 0.9 mg/dL (ref 0.7–1.3)
Calcium: 9.4 mg/dL (ref 8.4–10.4)
Chloride: 104 mEq/L (ref 98–109)
EGFR: 85 mL/min/{1.73_m2} — AB (ref >90)
GLUCOSE: 132 mg/dL (ref 70–140)
Potassium: 4 mEq/L (ref 3.5–5.1)
SODIUM: 140 meq/L (ref 136–145)
TOTAL PROTEIN: 8.1 g/dL (ref 6.4–8.3)

## 2015-07-28 LAB — CBC WITH DIFFERENTIAL/PLATELET
BASO%: 0.6 % (ref 0.0–2.0)
Basophils Absolute: 0 10*3/uL (ref 0.0–0.1)
EOS%: 2.1 % (ref 0.0–7.0)
Eosinophils Absolute: 0.1 10*3/uL (ref 0.0–0.5)
HCT: 35.3 % — ABNORMAL LOW (ref 38.4–49.9)
HGB: 10.6 g/dL — ABNORMAL LOW (ref 13.0–17.1)
LYMPH%: 26.6 % (ref 14.0–49.0)
MCH: 21.8 pg — ABNORMAL LOW (ref 27.2–33.4)
MCHC: 30 g/dL — ABNORMAL LOW (ref 32.0–36.0)
MCV: 72.8 fL — ABNORMAL LOW (ref 79.3–98.0)
MONO#: 0.6 10*3/uL (ref 0.1–0.9)
MONO%: 12 % (ref 0.0–14.0)
NEUT%: 58.7 % (ref 39.0–75.0)
NEUTROS ABS: 3.1 10*3/uL (ref 1.5–6.5)
Platelets: 343 10*3/uL (ref 140–400)
RBC: 4.85 10*6/uL (ref 4.20–5.82)
RDW: 22.4 % — ABNORMAL HIGH (ref 11.0–14.6)
WBC: 5.2 10*3/uL (ref 4.0–10.3)
lymph#: 1.4 10*3/uL (ref 0.9–3.3)

## 2015-07-28 LAB — FERRITIN: Ferritin: 15 ng/ml — ABNORMAL LOW (ref 22–316)

## 2015-07-28 NOTE — Telephone Encounter (Signed)
Pt returned call, instructed him to resume ferrous sulfate 325 mg BID. He reports he is hesitant to take iron due to his hemachromatosis. Discussed with Dr. Benay Spice: Hold the iron, may correct on its own since he is no longer bleeding.  Left message on voicemail informing pt.

## 2015-07-28 NOTE — Telephone Encounter (Signed)
-----   Message from Ladell Pier, MD sent at 07/28/2015  1:50 PM EST ----- Please call patient, iron is low, start ferrous sulfate 325mg  bid, f/u as scheduled

## 2015-07-28 NOTE — Progress Notes (Signed)
Oral Chemotherapy Pharmacist Encounter   I spoke with patient for overview of new oral chemotherapy medication: Xeloda. Pt is doing well. The prescriptions have been sent to the [facility] for benefit analysis and approval. Copay at [facility] is $50. Patient will pick up today and start therapy tomorrow on 07/29/15  Counseled patient on administration, dosing, side effects, safe handling, and monitoring. Side effects include but not limited to: Fatigue, Nausea, diarrhea, mouth sores, and hand/foot syndrome.  Gabriel Erickson voiced understanding and appreciation.   All questions answered.   Will follow up in 1 week for adherence and toxicity management.   Thank you,  Montel Clock, PharmD, [facility]

## 2015-07-28 NOTE — Progress Notes (Signed)
Signed consent for xeloda.

## 2015-07-29 ENCOUNTER — Other Ambulatory Visit: Payer: Self-pay | Admitting: *Deleted

## 2015-07-29 DIAGNOSIS — C182 Malignant neoplasm of ascending colon: Secondary | ICD-10-CM

## 2015-07-30 ENCOUNTER — Telehealth: Payer: Self-pay | Admitting: Genetic Counselor

## 2015-07-30 NOTE — Telephone Encounter (Signed)
Pt will call when he is ready for testing.

## 2015-08-05 ENCOUNTER — Encounter: Payer: Self-pay | Admitting: Pharmacist

## 2015-08-05 NOTE — Progress Notes (Signed)
Oral Chemotherapy Follow-Up Form  Original Start date of oral chemotherapy: _1/11/17__   Called patient today to follow up regarding patient's oral chemotherapy medication: _Xeloda_  Pt is doing well today. Pt has some mild fatigue. Reports some constipation which is opposite of what is expected with Xeloda. He uses miralax if needed. No missed doses. Will continue to monitor side effects  Pt reports _0__ tablets/doses missed in the last week/month.    Pt reports the following side effects: __fatigue/constipation___    Will follow up and call patient again in _3 weeks___   Thank you,  Montel Clock, PharmD, [facility]

## 2015-08-07 ENCOUNTER — Other Ambulatory Visit: Payer: Self-pay | Admitting: Pharmacist

## 2015-08-07 DIAGNOSIS — C182 Malignant neoplasm of ascending colon: Secondary | ICD-10-CM

## 2015-08-07 MED ORDER — ONDANSETRON HCL 4 MG PO TABS: 4.0000 mg | ORAL_TABLET | Freq: Four times a day (QID) | ORAL | Status: DC | PRN

## 2015-08-07 MED FILL — ONDANSETRON HCL 4 MG TABLET: 4 | 5 days supply | Qty: 20 | Fill #0

## 2015-08-10 ENCOUNTER — Other Ambulatory Visit: Payer: Self-pay | Admitting: Oncology

## 2015-08-17 ENCOUNTER — Ambulatory Visit (HOSPITAL_BASED_OUTPATIENT_CLINIC_OR_DEPARTMENT_OTHER): Payer: Medicare Other | Admitting: Nurse Practitioner

## 2015-08-17 ENCOUNTER — Telehealth: Payer: Self-pay | Admitting: Oncology

## 2015-08-17 ENCOUNTER — Other Ambulatory Visit: Payer: Self-pay | Admitting: Oncology

## 2015-08-17 ENCOUNTER — Encounter: Payer: Self-pay | Admitting: *Deleted

## 2015-08-17 ENCOUNTER — Other Ambulatory Visit (HOSPITAL_BASED_OUTPATIENT_CLINIC_OR_DEPARTMENT_OTHER): Payer: Medicare Other

## 2015-08-17 ENCOUNTER — Ambulatory Visit (HOSPITAL_COMMUNITY)
Admission: RE | Admit: 2015-08-17 | Discharge: 2015-08-17 | Disposition: A | Discharge status: Home / Self Care | Payer: Medicare Other | Source: Ambulatory Visit | Attending: Gastroenterology | Admitting: Gastroenterology

## 2015-08-17 VITALS — BP 156/66 | HR 90 | Temp 98.0°F | Resp 18 | Ht 72.0 in | Wt 168.9 lb

## 2015-08-17 DIAGNOSIS — E042 Nontoxic multinodular goiter: Secondary | ICD-10-CM | POA: Insufficient documentation

## 2015-08-17 DIAGNOSIS — C182 Malignant neoplasm of ascending colon: Secondary | ICD-10-CM

## 2015-08-17 DIAGNOSIS — E041 Nontoxic single thyroid nodule: Secondary | ICD-10-CM

## 2015-08-17 DIAGNOSIS — D509 Iron deficiency anemia, unspecified: Secondary | ICD-10-CM | POA: Diagnosis not present

## 2015-08-17 LAB — CBC WITH DIFFERENTIAL/PLATELET
BASO%: 0.4 % (ref 0.0–2.0)
BASOS ABS: 0 10*3/uL (ref 0.0–0.1)
EOS ABS: 0.1 10*3/uL (ref 0.0–0.5)
EOS%: 1.4 % (ref 0.0–7.0)
HCT: 31.3 % — ABNORMAL LOW (ref 38.4–49.9)
HGB: 9.5 g/dL — ABNORMAL LOW (ref 13.0–17.1)
LYMPH%: 26.9 % (ref 14.0–49.0)
MCH: 22.2 pg — AB (ref 27.2–33.4)
MCHC: 30.4 g/dL — AB (ref 32.0–36.0)
MCV: 73 fL — AB (ref 79.3–98.0)
MONO#: 0.9 10*3/uL (ref 0.1–0.9)
MONO%: 15.9 % — AB (ref 0.0–14.0)
NEUT%: 55.4 % (ref 39.0–75.0)
NEUTROS ABS: 3.2 10*3/uL (ref 1.5–6.5)
PLATELETS: 382 10*3/uL (ref 140–400)
RBC: 4.28 10*6/uL (ref 4.20–5.82)
RDW: 22.9 % — ABNORMAL HIGH (ref 11.0–14.6)
WBC: 5.8 10*3/uL (ref 4.0–10.3)
lymph#: 1.6 10*3/uL (ref 0.9–3.3)

## 2015-08-17 MED ORDER — CAPECITABINE 500 MG PO TABS: 1000.0000 mg/m2 | ORAL_TABLET | Freq: Two times a day (BID) | ORAL | Status: DC

## 2015-08-17 MED FILL — CAPECITABINE 500 MG TABLET: 500 | 21 days supply | Qty: 112 | Fill #0

## 2015-08-17 NOTE — Progress Notes (Signed)
[place] OFFICE PROGRESS NOTE   Diagnosis:  Colon cancer  INTERVAL HISTORY:   Gabriel Erickson returns as scheduled. He began cycle 1 adjuvant Xeloda 07/29/2015. He has mild intermittent nausea. No vomiting. He takes Compazine or Zofran as needed. He had a single mouth sore which has resolved. He has had one or 2 loose stools over the past few weeks. He notes increased "gas". No hand or foot pain or redness. He notes mild intermittent numbness in the first 3 digits on both hands.  Objective:  Vital signs in last 24 hours:  Blood pressure 156/66, pulse 90, temperature 98 F (36.7 C), temperature source Oral, resp. rate 18, height 6' (1.829 m), weight 168 lb 14.4 oz (76.613 kg), SpO2 100 %.    HEENT: No thrush or ulcers. Resp: Lungs clear bilaterally. Cardio: Regular rate and rhythm. GI: Abdomen soft and nontender. No hepatomegaly. Vascular: No leg edema. Skin: Palms without erythema.    Lab Results:  Lab Results  Component Value Date   WBC 5.8 08/17/2015   HGB 9.5* 08/17/2015   HCT 31.3* 08/17/2015   MCV 73.0* 08/17/2015   PLT 382 08/17/2015   NEUTROABS 3.2 08/17/2015    Imaging:  US Soft Tissue Head/neck  08/17/2015  CLINICAL DATA:  75 year old male with a history of thyroid nodule identified on chest CT. EXAM: THYROID ULTRASOUND TECHNIQUE: Ultrasound examination of the thyroid gland and adjacent soft tissues was performed. COMPARISON:  CTA 05/15/2015 FINDINGS: Right thyroid lobe Measurements: 4.8 cm x 2.2 cm x 2.5 cm. Multiple small nodules measured of the right thyroid. Upper pole nodule:  7 mm x 4 mm x 6 mm. Mid thyroid: 1.4 cm x 1.2 cm x 1.1 cm. Solid features without internal calcifications. Lower right thyroid:  8 mm x 6 mm x 6 mm with cystic features. Left thyroid lobe Measurements: 4.8 cm x 2.5 cm x 2.0 cm. Small nodules of the left thyroid measured. Upper pole: 1.1 cm x 6 mm x 8 mm. No internal calcifications. Well-defined border. Mid left thyroid:  9 mm x  6 mm x 8 mm. Lower pole: 1.1 cm x 6 mm x 9 mm. No internal calcifications, complex features, and without ill-defined border. Isthmus Thickness: 5 mm.  No nodules visualized. Lymphadenopathy None visualized. IMPRESSION: Multinodular thyroid. Dominant right-sided nodule does not meet criteria for biopsy. Findings do not meet current SRU consensus criteria for biopsy. Follow-up by clinical exam is recommended. In a patient of this age with no risk factors for thyroid carcinoma, lengthening the surveillance interval beyond 12 months is a reasonable strategy, as thyroid cancers <2cm are known to be indolent with nearly 100% 10-year survival. If the patient has known risk factors for thyroid carcinoma, a follow-up ultrasound in 12 months is recommended. - Managing incidental thyroid nodules detected on imaging: white paper of the ACR Incidental Thyroid Findings Committee. J Am Coll Radiol. 2015 Feb;12(2):143-50. - Management of Thyroid Nodules Detected at [place]: Society of Radiologists in [place]. Radiology 2005; [id number]. Signed, Dulcy Fanny. Earleen Newport, DO [facility] Radiology Electronically Signed   By: Corrie Mckusick D.O.   On: 08/17/2015 12:22    Medications: I have reviewed the patient's current medications.  Assessment/Plan: 1. Adenocarcinoma of the ascending colon, stage IIIB (T3, N1b), status post a partial colectomy 06/24/2015  2 of 27 lymph nodes contained metastatic carcinoma, lymphovascular and perineural invasion present  Microsatellite stable with no loss of mismatch repair protein expression  Cycle 1  adjuvant Xeloda 07/29/2015  Cycle 2 adjuvant Xeloda 08/19/2015  2. Diabetes  3. Hemachromatosis heterozygote  4. Family history of colon, ovarian, and biliary tract cancers  5. Indeterminate lung lesions on the chest CT 05/15/2015  6.   Anemia, due to iron deficiency    Disposition: Gabriel Erickson appears  stable. He has completed 1 cycle of adjuvant Xeloda. He seems to have tolerated the first cycle well. Plan to proceed with cycle 2 as scheduled beginning 08/19/2015.  He has iron deficiency anemia. He will begin ferrous sulfate 325 mg twice daily. We will follow labs closely as he has a history of hemochromatosis.  He will return for a follow-up visit in 3 weeks.  Plan reviewed with Dr. Benay Spice.  [name] ANP/GNP-BC   08/17/2015  1:33 PM

## 2015-08-17 NOTE — Telephone Encounter (Signed)
Gave patient avs report and appointments for February.  °

## 2015-08-17 NOTE — Progress Notes (Signed)
  Oncology Nurse Navigator Documentation  Navigator Location: [facility]-Med Onc (08/17/15 1401) Navigator Encounter Type: Follow-up Appt (08/17/15 1401)         Treatment Initiated Date: 07/29/15 (08/17/15 1401) Patient Visit Type: MedOnc;Follow-up (08/17/15 1401) Treatment Phase: Treatment (08/17/15 1401) Xeloda cycle # 2 due 08/19/15 Barriers/Navigation Needs: No barriers at this time (08/17/15 1401)  Anxious in regards to taking oral iron due to h/o hemachromatosis, even thought his Hgb is <10 Interventions: None required (08/17/15 1401)            Acuity: Level 1 (08/17/15 1401)         Time Spent with Patient: 15 (08/17/15 1401)

## 2015-08-31 ENCOUNTER — Encounter: Payer: Self-pay | Admitting: Pharmacist

## 2015-08-31 NOTE — Progress Notes (Signed)
Oral Chemotherapy Follow-Up Form  Original Start date of oral chemotherapy: _1/11/17__  Called patient today to follow up regarding patient's oral chemotherapy medication: _Xeloda_  Pt is doing well today. Pt has some mild fatigue. He uses miralax if needed and zofran/compazine if needed for nausea. No missed doses. Also reports some minor peripheral neuropathy/tingling in hands (this would be a rare side effect with Xeloda of < 10%). Will continue to monitor side effects.   Pt reports _0__ tablets/doses missed in the last week/month.    Pt reports the following side effects: __fatigue/nausea/neorapthy___    Will follow up and call patient again in _3 weeks___   Thank you,  Montel Clock, PharmD, [facility]

## 2015-09-07 ENCOUNTER — Ambulatory Visit (HOSPITAL_BASED_OUTPATIENT_CLINIC_OR_DEPARTMENT_OTHER): Payer: Medicare Other | Admitting: Oncology

## 2015-09-07 ENCOUNTER — Other Ambulatory Visit: Payer: Self-pay | Admitting: *Deleted

## 2015-09-07 ENCOUNTER — Ambulatory Visit (INDEPENDENT_AMBULATORY_CARE_PROVIDER_SITE_OTHER): Payer: Medicare Other | Admitting: Internal Medicine

## 2015-09-07 ENCOUNTER — Other Ambulatory Visit (HOSPITAL_BASED_OUTPATIENT_CLINIC_OR_DEPARTMENT_OTHER): Payer: Medicare Other

## 2015-09-07 ENCOUNTER — Other Ambulatory Visit (INDEPENDENT_AMBULATORY_CARE_PROVIDER_SITE_OTHER): Payer: Medicare Other

## 2015-09-07 ENCOUNTER — Encounter: Payer: Self-pay | Admitting: Internal Medicine

## 2015-09-07 ENCOUNTER — Telehealth: Payer: Self-pay | Admitting: Oncology

## 2015-09-07 VITALS — BP 130/68 | HR 96 | Wt 169.0 lb

## 2015-09-07 VITALS — BP 157/91 | HR 95 | Temp 97.6°F | Resp 18 | Ht 72.0 in | Wt 168.7 lb

## 2015-09-07 DIAGNOSIS — D509 Iron deficiency anemia, unspecified: Secondary | ICD-10-CM | POA: Diagnosis not present

## 2015-09-07 DIAGNOSIS — E1142 Type 2 diabetes mellitus with diabetic polyneuropathy: Secondary | ICD-10-CM | POA: Diagnosis not present

## 2015-09-07 DIAGNOSIS — C182 Malignant neoplasm of ascending colon: Secondary | ICD-10-CM

## 2015-09-07 DIAGNOSIS — M5440 Lumbago with sciatica, unspecified side: Secondary | ICD-10-CM

## 2015-09-07 DIAGNOSIS — E119 Type 2 diabetes mellitus without complications: Secondary | ICD-10-CM

## 2015-09-07 DIAGNOSIS — G8929 Other chronic pain: Secondary | ICD-10-CM

## 2015-09-07 DIAGNOSIS — R202 Paresthesia of skin: Secondary | ICD-10-CM | POA: Insufficient documentation

## 2015-09-07 LAB — CBC WITH DIFFERENTIAL/PLATELET
BASO%: 0.8 % (ref 0.0–2.0)
BASOS ABS: 0 10*3/uL (ref 0.0–0.1)
Basophils Absolute: 0 10*3/uL (ref 0.0–0.1)
Basophils Relative: 0.7 % (ref 0.0–3.0)
EOS ABS: 0.1 10*3/uL (ref 0.0–0.5)
EOS ABS: 0.1 10*3/uL (ref 0.0–0.7)
EOS PCT: 2.5 % (ref 0.0–5.0)
EOS%: 2.4 % (ref 0.0–7.0)
HCT: 33.6 % — ABNORMAL LOW (ref 38.4–49.9)
HCT: 30.8 % — ABNORMAL LOW (ref 39.0–52.0)
HEMOGLOBIN: 10.2 g/dL — AB (ref 13.0–17.1)
HEMOGLOBIN: 9.9 g/dL — AB (ref 13.0–17.0)
LYMPH#: 1.1 10*3/uL (ref 0.9–3.3)
LYMPH%: 28.2 % (ref 14.0–49.0)
LYMPHS PCT: 32.1 % (ref 12.0–46.0)
Lymphs Abs: 1.6 10*3/uL (ref 0.7–4.0)
MCH: 23.1 pg — AB (ref 27.2–33.4)
MCHC: 30.4 g/dL — ABNORMAL LOW (ref 32.0–36.0)
MCHC: 32.1 g/dL (ref 30.0–36.0)
MCV: 76.1 fL — AB (ref 79.3–98.0)
MCV: 73.8 fl — ABNORMAL LOW (ref 78.0–100.0)
MONO ABS: 0.9 10*3/uL (ref 0.1–1.0)
MONO#: 0.8 10*3/uL (ref 0.1–0.9)
MONO%: 19.1 % — ABNORMAL HIGH (ref 0.0–14.0)
Monocytes Relative: 17.6 % — ABNORMAL HIGH (ref 3.0–12.0)
NEUT#: 2 10*3/uL (ref 1.5–6.5)
NEUT%: 49.5 % (ref 39.0–75.0)
Neutro Abs: 2.3 10*3/uL (ref 1.4–7.7)
Neutrophils Relative %: 47.1 % (ref 43.0–77.0)
PLATELETS: 385 10*3/uL (ref 150.0–400.0)
Platelets: 346 10*3/uL (ref 140–400)
RBC: 4.41 10*6/uL (ref 4.20–5.82)
RBC: 4.18 Mil/uL — AB (ref 4.22–5.81)
RDW: 22.1 % — AB (ref 11.0–14.6)
RDW: 23.5 % — ABNORMAL HIGH (ref 11.5–15.5)
WBC: 4 10*3/uL (ref 4.0–10.3)
WBC: 4.9 10*3/uL (ref 4.0–10.5)

## 2015-09-07 LAB — BASIC METABOLIC PANEL
BUN: 12 mg/dL (ref 6–23)
CO2: 27 mEq/L (ref 19–32)
CREATININE: 0.87 mg/dL (ref 0.40–1.50)
Calcium: 9 mg/dL (ref 8.4–10.5)
Chloride: 104 mEq/L (ref 96–112)
GFR: 91.58 mL/min (ref >60.00)
Glucose, Bld: 144 mg/dL — ABNORMAL HIGH (ref 70–99)
POTASSIUM: 4.4 meq/L (ref 3.5–5.1)
Sodium: 136 mEq/L (ref 135–145)

## 2015-09-07 LAB — COMPREHENSIVE METABOLIC PANEL
ALBUMIN: 3.4 g/dL — AB (ref 3.5–5.0)
ALK PHOS: 107 U/L (ref 40–150)
ALT: 14 U/L (ref 0–55)
ANION GAP: 5 meq/L (ref 3–11)
AST: 22 U/L (ref 5–34)
BUN: 11.9 mg/dL (ref 7.0–26.0)
CALCIUM: 9 mg/dL (ref 8.4–10.4)
CO2: 26 mEq/L (ref 22–29)
CREATININE: 0.9 mg/dL (ref 0.7–1.3)
Chloride: 106 mEq/L (ref 98–109)
EGFR: 83 mL/min/{1.73_m2} — ABNORMAL LOW (ref >90)
Glucose: 132 mg/dl (ref 70–140)
POTASSIUM: 4.5 meq/L (ref 3.5–5.1)
Sodium: 137 mEq/L (ref 136–145)
Total Bilirubin: 0.43 mg/dL (ref 0.20–1.20)
Total Protein: 7.5 g/dL (ref 6.4–8.3)

## 2015-09-07 LAB — HEPATIC FUNCTION PANEL
ALT: 12 U/L (ref 0–53)
AST: 19 U/L (ref 0–37)
Albumin: 3.8 g/dL (ref 3.5–5.2)
Alkaline Phosphatase: 88 U/L (ref 39–117)
BILIRUBIN TOTAL: 0.3 mg/dL (ref 0.2–1.2)
Bilirubin, Direct: 0.1 mg/dL (ref 0.0–0.3)
Total Protein: 7.2 g/dL (ref 6.0–8.3)

## 2015-09-07 LAB — HEMOGLOBIN A1C: HEMOGLOBIN A1C: 7.7 % — AB (ref 4.6–6.5)

## 2015-09-07 MED ORDER — METHADONE HCL 10 MG PO TABS: 20.0000 mg | ORAL_TABLET | Freq: Four times a day (QID) | ORAL | Status: DC | PRN

## 2015-09-07 MED ORDER — GLIMEPIRIDE 4 MG PO TABS: 4.0000 mg | ORAL_TABLET | Freq: Two times a day (BID) | ORAL | Status: DC

## 2015-09-07 MED ORDER — CAPECITABINE 500 MG PO TABS: 1000.0000 mg/m2 | ORAL_TABLET | Freq: Two times a day (BID) | ORAL | Status: DC

## 2015-09-07 MED FILL — XELODA 500 MG TABLET: 500 | 21 days supply | Qty: 112 | Fill #0

## 2015-09-07 MED FILL — ONDANSETRON HCL 4 MG TABLET: 4 | 5 days supply | Qty: 20 | Fill #1

## 2015-09-07 NOTE — Patient Instructions (Signed)
Vitamin B complex, Vitamin D

## 2015-09-07 NOTE — Assessment & Plan Note (Signed)
On Glimeperide

## 2015-09-07 NOTE — Assessment & Plan Note (Signed)
Dr Brantley Stage - s/p bowel resection on 06/24/15: IIIB Dr Benay Spice - on Xeloda po

## 2015-09-07 NOTE — Progress Notes (Signed)
Pre visit review using our clinic review tool, if applicable. No additional management support is needed unless otherwise documented below in the visit note. 

## 2015-09-07 NOTE — Assessment & Plan Note (Signed)
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 addiction risk, apnea etc) and complications (i.e. Somnolence, constipation and others) were explained to the patient and were aknowledged. Benzo use risk is discussed  

## 2015-09-07 NOTE — Assessment & Plan Note (Signed)
Due to Xeloda 2017

## 2015-09-07 NOTE — Progress Notes (Signed)
  [place] OFFICE PROGRESS NOTE   Diagnosis: colon cancer  INTERVAL HISTORY:   Gabriel Erickson returns as scheduled.he completed another cycle of Xeloda beginning 08/19/2015. No diarrhea.no pain at the hands or feet. He reports a "rash "developing over the forehead that was relieved with hydrocortisone cream. He has developed a hyperpigmented area at the left  Lower leg.  Objective:  Vital signs in last 24 hours:  There were no vitals taken for this visit.    HEENT: no thrush or ulcers Resp: lungs clear bilaterally Cardio: regular rate and rhythm GI: no hepatomegaly, nontender Vascular: no leg edema  Skin:area of flat hyperpigmentation at the lateral left lower leg, mild erythema at the soles, and a few patches a faint erythema at the forehead     Lab Results:  Lab Results  Component Value Date   WBC 5.8 08/17/2015   HGB 9.5* 08/17/2015   HCT 31.3* 08/17/2015   MCV 73.0* 08/17/2015   PLT 382 08/17/2015   NEUTROABS 3.2 08/17/2015     Medications: I have reviewed the patient's current medications.  Assessment/Plan: 1. Adenocarcinoma of the ascending colon, stage IIIB (T3, N1b), status post a partial colectomy 06/24/2015  2 of 27 lymph nodes contained metastatic carcinoma, lymphovascular and perineural invasion present  Microsatellite stable with no loss of mismatch repair protein expression  Cycle 1 adjuvant Xeloda 07/29/2015  Cycle 2 adjuvant Xeloda 08/19/2015  Cycle 3 adjuvant Xeloda 09/09/2015  2. Diabetes  3. Hemachromatosis heterozygote  4. Family history of colon, ovarian, and biliary tract cancers  5. Indeterminate lung lesions on the chest CT 05/15/2015  6. Anemia, due to iron deficiency   Disposition:  Gabriel Erickson has completed 2 cycles of adjuvant Xeloda. He is tolerating the chemotherapy well. I suspect the mild rash at the forehead and hyperpigmentation at the left lower leg are related to Xeloda. He will contact us for a  progressive rash or hand/foot symptoms. He will begin cycle 3 on 09/09/2015.  Gabriel Erickson will remain off of iron therapy. He has iron deficiency anemia secondary to colon cancer and phlebotomy. The hemoglobin is stable.  Betsy Coder, MD  09/07/2015  10:01 AM

## 2015-09-07 NOTE — Progress Notes (Signed)
Subjective:  Patient ID: Gabriel Erickson, male    DOB: 10-15-1940  Age: 75 y.o. MRN: [id number]  CC: No chief complaint on file.   HPI Gabriel Erickson presents for LBP, DM, spasms. F/u colon ca - pt has rash from chemo - Xeloda  Outpatient Prescriptions Prior to Visit  Medication Sig Dispense Refill  . ACCU-CHEK SOFTCLIX LANCETS lancets by Other route 2 (two) times daily. Use as instructed     . b complex vitamins tablet Take 1 tablet by mouth daily.      . capecitabine (XELODA) 500 MG tablet Take 4 tablets (2,000 mg total) by mouth 2 (two) times daily after a meal. Take for 14 days then 7 days off. Repeat every 21 days 112 tablet 0  . Cholecalciferol 1000 UNITS tablet Take 1,000 Units by mouth daily.      . diazepam (VALIUM) 5 MG tablet Take 1 tablet (5 mg total) by mouth 2 (two) times daily as needed for muscle spasms (spasms). For cramps or insomnia 60 tablet 3  . fluticasone (FLONASE) 50 MCG/ACT nasal spray Place 2 sprays into the nose daily. 16 g 11  . glimepiride (AMARYL) 4 MG tablet TAKE 1 TABLET TWICE DAILY 60 tablet 5  . glucose blood (ONE TOUCH ULTRA TEST) test strip 1 each by Other route 2 (two) times daily. Dx 250.00 fluctuating blood sugar     . loperamide (IMODIUM A-D) 2 MG tablet Take 2-4 mg by mouth 4 (four) times daily as needed for diarrhea or loose stools.    . methadone (DOLOPHINE) 10 MG tablet Take 2 tablets (20 mg total) by mouth 4 (four) times daily as needed for severe pain. For pain - FILL ON OR AFTER 08/19/2015 240 tablet 0  . ondansetron (ZOFRAN) 4 MG tablet Take 1 tablet (4 mg total) by mouth every 6 (six) hours as needed for nausea. 20 tablet 6  . prochlorperazine (COMPAZINE) 10 MG tablet Take 1 tablet (10 mg total) by mouth every 6 (six) hours as needed for nausea or vomiting. 30 tablet 0  . pseudoephedrine-guaifenesin (MUCINEX D) 60-600 MG per tablet Take 1 tablet by mouth every 12 (twelve) hours as needed for congestion.     . ranitidine (ZANTAC) 75 MG tablet Take 75  mg by mouth 2 (two) times daily.    . capecitabine (XELODA) 500 MG tablet Take 4 tablets (2,000 mg total) by mouth 2 (two) times daily after a meal. Take for 14 days then 7 days off. Repeat every 21 days 112 tablet 0   No facility-administered medications prior to visit.    ROS Review of Systems  Constitutional: Positive for fatigue. Negative for appetite change and unexpected weight change.  HENT: Negative for congestion, nosebleeds, sneezing, sore throat and trouble swallowing.   Eyes: Negative for itching and visual disturbance.  Respiratory: Negative for cough.   Cardiovascular: Negative for chest pain, palpitations and leg swelling.  Gastrointestinal: Negative for nausea, diarrhea, blood in stool and abdominal distention.  Genitourinary: Negative for frequency and hematuria.  Musculoskeletal: Positive for back pain and gait problem. Negative for joint swelling and neck pain.  Skin: Positive for color change and rash.  Neurological: Positive for weakness and numbness. Negative for dizziness, tremors and speech difficulty.  Psychiatric/Behavioral: Negative for sleep disturbance, dysphoric mood and agitation. The patient is not nervous/anxious.     Objective:  BP 130/68 mmHg  Pulse 96  Wt 169 lb (76.658 kg)  SpO2 97%  BP Readings from Last 3  Encounters:  09/07/15 130/68  09/07/15 157/91  08/17/15 156/66    Wt Readings from Last 3 Encounters:  09/07/15 169 lb (76.658 kg)  09/07/15 168 lb 11.2 oz (76.522 kg)  08/17/15 168 lb 14.4 oz (76.613 kg)    Physical Exam  Constitutional: He is oriented to person, place, and time. He appears well-developed. No distress.  NAD  HENT:  Mouth/Throat: Oropharynx is clear and moist.  Eyes: Conjunctivae are normal. Pupils are equal, round, and reactive to light.  Neck: Normal range of motion. No JVD present. No thyromegaly present.  Cardiovascular: Normal rate, regular rhythm, normal heart sounds and intact distal pulses.  Exam reveals no  gallop and no friction rub.   No murmur heard. Pulmonary/Chest: Effort normal and breath sounds normal. No respiratory distress. He has no wheezes. He has no rales. He exhibits no tenderness.  Abdominal: Soft. Bowel sounds are normal. He exhibits no distension and no mass. There is no tenderness. There is no rebound and no guarding.  Musculoskeletal: Normal range of motion. He exhibits no edema or tenderness.  Lymphadenopathy:    He has no cervical adenopathy.  Neurological: He is alert and oriented to person, place, and time. He has normal reflexes. No cranial nerve deficit. He exhibits normal muscle tone. He displays a negative Romberg sign. Coordination and gait normal.  Skin: Skin is warm and dry. No rash noted.  Psychiatric: He has a normal mood and affect. His behavior is normal. Judgment and thought content normal.    Lab Results  Component Value Date   WBC 4.0 09/07/2015   HGB 10.2* 09/07/2015   HCT 33.6* 09/07/2015   PLT 346 09/07/2015   GLUCOSE 132 09/07/2015   CHOL 199 02/25/2014   TRIG 239* 02/25/2014   HDL 43 02/25/2014   LDLDIRECT 142.3 04/12/2010   LDLCALC 108* 02/25/2014   ALT 14 09/07/2015   AST 22 09/07/2015   NA 137 09/07/2015   K 4.5 09/07/2015   CL 108 06/25/2015   CREATININE 0.9 09/07/2015   BUN 11.9 09/07/2015   CO2 26 09/07/2015   TSH 0.39 06/03/2015   PSA 1.33 12/12/2014   HGBA1C 7.8* 06/03/2015    US Soft Tissue Head/neck  08/17/2015  CLINICAL DATA:  75 year old male with a history of thyroid nodule identified on chest CT. EXAM: THYROID ULTRASOUND TECHNIQUE: Ultrasound examination of the thyroid gland and adjacent soft tissues was performed. COMPARISON:  CTA 05/15/2015 FINDINGS: Right thyroid lobe Measurements: 4.8 cm x 2.2 cm x 2.5 cm. Multiple small nodules measured of the right thyroid. Upper pole nodule:  7 mm x 4 mm x 6 mm. Mid thyroid: 1.4 cm x 1.2 cm x 1.1 cm. Solid features without internal calcifications. Lower right thyroid:  8 mm x 6 mm x 6  mm with cystic features. Left thyroid lobe Measurements: 4.8 cm x 2.5 cm x 2.0 cm. Small nodules of the left thyroid measured. Upper pole: 1.1 cm x 6 mm x 8 mm. No internal calcifications. Well-defined border. Mid left thyroid:  9 mm x 6 mm x 8 mm. Lower pole: 1.1 cm x 6 mm x 9 mm. No internal calcifications, complex features, and without ill-defined border. Isthmus Thickness: 5 mm.  No nodules visualized. Lymphadenopathy None visualized. IMPRESSION: Multinodular thyroid. Dominant right-sided nodule does not meet criteria for biopsy. Findings do not meet current SRU consensus criteria for biopsy. Follow-up by clinical exam is recommended. In a patient of this age with no risk factors for thyroid carcinoma, lengthening the surveillance  interval beyond 12 months is a reasonable strategy, as thyroid cancers <2cm are known to be indolent with nearly 100% 10-year survival. If the patient has known risk factors for thyroid carcinoma, a follow-up ultrasound in 12 months is recommended. - Managing incidental thyroid nodules detected on imaging: white paper of the ACR Incidental Thyroid Findings Committee. J Am Coll Radiol. 2015 Feb;12(2):143-50. - Management of Thyroid Nodules Detected at [place]: Society of Radiologists in [place]. Radiology 2005; [id number]. Signed, Dulcy Fanny. Earleen Newport, DO [facility] Radiology Electronically Signed   By: Corrie Mckusick D.O.   On: 08/17/2015 12:22    Assessment & Plan:   There are no diagnoses linked to this encounter. I am having Mr. Moorefield maintain his ACCU-CHEK SOFTCLIX LANCETS, glucose blood, Cholecalciferol, b complex vitamins, pseudoephedrine-guaifenesin, fluticasone, glimepiride, diazepam, methadone, loperamide, prochlorperazine, ondansetron, ranitidine, and capecitabine.  No orders of the defined types were placed in this encounter.     Follow-up: No Follow-up on file.  Walker Kehr, MD

## 2015-09-07 NOTE — Telephone Encounter (Signed)
Pt confirmed appt and received avs °

## 2015-09-24 ENCOUNTER — Telehealth: Payer: Self-pay | Admitting: Pharmacist

## 2015-09-24 NOTE — Telephone Encounter (Signed)
09/24/15: Attempted to reach patient for follow up on oral medication: Xeloda. No answer. Left VM for patient to call back with any questions or issues.   Thank you,  Montel Clock, PharmD, [facility] [phone]

## 2015-09-28 ENCOUNTER — Ambulatory Visit (HOSPITAL_BASED_OUTPATIENT_CLINIC_OR_DEPARTMENT_OTHER): Payer: Medicare Other | Admitting: Nurse Practitioner

## 2015-09-28 ENCOUNTER — Other Ambulatory Visit (HOSPITAL_BASED_OUTPATIENT_CLINIC_OR_DEPARTMENT_OTHER): Payer: Medicare Other

## 2015-09-28 ENCOUNTER — Telehealth: Payer: Self-pay | Admitting: Nurse Practitioner

## 2015-09-28 ENCOUNTER — Other Ambulatory Visit: Payer: Self-pay | Admitting: *Deleted

## 2015-09-28 VITALS — BP 135/59 | HR 79 | Temp 97.9°F | Resp 18 | Ht 72.0 in | Wt 170.6 lb

## 2015-09-28 DIAGNOSIS — D509 Iron deficiency anemia, unspecified: Secondary | ICD-10-CM

## 2015-09-28 DIAGNOSIS — L271 Localized skin eruption due to drugs and medicaments taken internally: Secondary | ICD-10-CM | POA: Diagnosis not present

## 2015-09-28 DIAGNOSIS — C182 Malignant neoplasm of ascending colon: Secondary | ICD-10-CM | POA: Diagnosis not present

## 2015-09-28 LAB — COMPREHENSIVE METABOLIC PANEL
ALK PHOS: 104 U/L (ref 40–150)
ALT: 16 U/L (ref 0–55)
AST: 23 U/L (ref 5–34)
Albumin: 3.5 g/dL (ref 3.5–5.0)
Anion Gap: 4 mEq/L (ref 3–11)
BUN: 11.5 mg/dL (ref 7.0–26.0)
CO2: 27 mEq/L (ref 22–29)
Calcium: 8.8 mg/dL (ref 8.4–10.4)
Chloride: 107 mEq/L (ref 98–109)
Creatinine: 0.9 mg/dL (ref 0.7–1.3)
EGFR: 84 mL/min/{1.73_m2} — AB (ref >90)
GLUCOSE: 132 mg/dL (ref 70–140)
POTASSIUM: 4.6 meq/L (ref 3.5–5.1)
SODIUM: 138 meq/L (ref 136–145)
Total Bilirubin: 0.52 mg/dL (ref 0.20–1.20)
Total Protein: 7.3 g/dL (ref 6.4–8.3)

## 2015-09-28 LAB — CBC WITH DIFFERENTIAL/PLATELET
BASO%: 0.8 % (ref 0.0–2.0)
BASOS ABS: 0 10*3/uL (ref 0.0–0.1)
EOS ABS: 0.2 10*3/uL (ref 0.0–0.5)
EOS%: 4 % (ref 0.0–7.0)
HCT: 31.9 % — ABNORMAL LOW (ref 38.4–49.9)
HGB: 9.8 g/dL — ABNORMAL LOW (ref 13.0–17.1)
LYMPH%: 27.9 % (ref 14.0–49.0)
MCH: 24.5 pg — AB (ref 27.2–33.4)
MCHC: 30.6 g/dL — ABNORMAL LOW (ref 32.0–36.0)
MCV: 80.1 fL (ref 79.3–98.0)
MONO#: 0.8 10*3/uL (ref 0.1–0.9)
MONO%: 16.3 % — AB (ref 0.0–14.0)
NEUT#: 2.4 10*3/uL (ref 1.5–6.5)
NEUT%: 51 % (ref 39.0–75.0)
PLATELETS: 317 10*3/uL (ref 140–400)
RBC: 3.99 10*6/uL — AB (ref 4.20–5.82)
RDW: 25.6 % — ABNORMAL HIGH (ref 11.0–14.6)
WBC: 4.7 10*3/uL (ref 4.0–10.3)
lymph#: 1.3 10*3/uL (ref 0.9–3.3)

## 2015-09-28 MED ORDER — CAPECITABINE 500 MG PO TABS: 500.0000 mg/m2 | ORAL_TABLET | Freq: Two times a day (BID) | ORAL | Status: DC

## 2015-09-28 MED FILL — XELODA 500 MG TABLET: 500 | 21 days supply | Qty: 56 | Fill #0

## 2015-09-28 NOTE — Progress Notes (Signed)
  [place] OFFICE PROGRESS NOTE   Diagnosis:  Colon cancer  INTERVAL HISTORY:   Mr. Vandervelden returns as scheduled. He completed cycle 3 adjuvant Xeloda beginning 09/09/2015. He denies nausea/vomiting. No mouth sores. No diarrhea. He notes his appetite is diminished when taking Xeloda. Last week his hands and feet became "flame red" with mild associated discomfort. His wife reports the hands had a "blistery" appearance. Skin on the hands and feet is now peeling.  Objective:  Vital signs in last 24 hours:  Blood pressure 135/59, pulse 79, temperature 97.9 F (36.6 C), temperature source Oral, resp. rate 18, height 6' (1.829 m), weight 170 lb 9.6 oz (77.384 kg), SpO2 100 %.    HEENT: No thrush or ulcers. Resp: Lungs clear bilaterally. Cardio: Regular rate and rhythm. GI: Abdomen soft and nontender. No hepatomegaly. Vascular: No leg edema. Calves soft and nontender. Skin: Palms and soles with erythema; scattered areas of peeling skin.    Lab Results:  Lab Results  Component Value Date   WBC 4.7 09/28/2015   HGB 9.8* 09/28/2015   HCT 31.9* 09/28/2015   MCV 80.1 09/28/2015   PLT 317 09/28/2015   NEUTROABS 2.4 09/28/2015    Imaging:  No results found.  Medications: I have reviewed the patient's current medications.  Assessment/Plan: 1. Adenocarcinoma of the ascending colon, stage IIIB (T3, N1b), status post a partial colectomy 06/24/2015  2 of 27 lymph nodes contained metastatic carcinoma, lymphovascular and perineural invasion present  Microsatellite stable with no loss of mismatch repair protein expression  Cycle 1 adjuvant Xeloda 07/29/2015  Cycle 2 adjuvant Xeloda 08/19/2015  Cycle 3 adjuvant Xeloda 09/09/2015  Cycle 4 adjuvant Xeloda 10/07/2015 (50% dose reduction due to hand-foot syndrome following cycle 3)  2. Diabetes  3. Hemachromatosis heterozygote  4. Family history of colon, ovarian, and biliary tract cancers  5. Indeterminate  lung lesions on the chest CT 05/15/2015  6. Anemia, due to iron deficiency   Disposition: Mr. Wolford has completed 3 cycles of adjuvant Xeloda. He developed hand-foot syndrome last week. Palms and soles continue to be erythematous with areas of peeling skin. We will delay cycle 4 by 1 week (begin on 10/07/2015) and the Xeloda will be dose reduced by 50%. He will return for a follow-up visit on 10/22/2015. He understands to contact the office in the interim with recurrent erythema, pain, peeling of the hands and feet.  25 minutes were spent face-to-face at today's visit with the majority of that time involved in counseling/coordination of care.  [name] ANP/GNP-BC   09/28/2015  12:02 PM

## 2015-09-28 NOTE — Patient Instructions (Signed)
Begin next cycle of Xeloda on 3/22 Dose will be reduced due to hand/foot syndrome

## 2015-09-28 NOTE — Telephone Encounter (Signed)
per pof to sch pt appt-gave pt copy of avs °

## 2015-10-06 ENCOUNTER — Ambulatory Visit (HOSPITAL_BASED_OUTPATIENT_CLINIC_OR_DEPARTMENT_OTHER): Payer: Medicare Other | Admitting: Nurse Practitioner

## 2015-10-06 ENCOUNTER — Telehealth: Payer: Self-pay | Admitting: Pharmacist

## 2015-10-06 ENCOUNTER — Telehealth: Payer: Self-pay | Admitting: *Deleted

## 2015-10-06 VITALS — BP 147/69 | HR 88 | Temp 98.2°F | Resp 18 | Ht 72.0 in | Wt 172.3 lb

## 2015-10-06 DIAGNOSIS — D509 Iron deficiency anemia, unspecified: Secondary | ICD-10-CM

## 2015-10-06 DIAGNOSIS — L271 Localized skin eruption due to drugs and medicaments taken internally: Secondary | ICD-10-CM | POA: Diagnosis not present

## 2015-10-06 DIAGNOSIS — C182 Malignant neoplasm of ascending colon: Secondary | ICD-10-CM | POA: Diagnosis not present

## 2015-10-06 NOTE — Telephone Encounter (Signed)
Pt's wife called Oral Chemo Navigator for Xeloda instructions. Per Dr. Benay Spice: If pain has resolved, OK to restart 3/22 as scheduled at half the original dose. Last Rx was written for reduced dose.

## 2015-10-06 NOTE — Telephone Encounter (Signed)
Pts wife, Margaretha Sheffield called [facility] to inform us her husband's feet are VERY red.  There are 2 areas of blistering/bruising on his feet they are concerned about. He is supposed to restart Xeloda tomorrow and need to know what to do about restarting. I s/w Rosalio Macadamia, RN for Dr. Benay Spice and she will call pt back. Kennith Center, Pharm.D., CPP 10/06/2015@1 :01 PM

## 2015-10-06 NOTE — Telephone Encounter (Signed)
Pt came in to lobby- he reports plantar pain is worse. It is now painful to walk. He has open areas on feet. Hands with mild dry desquamation. He's concerned about his feet since he is diabetic. Reviewed with Dr. Benay Spice: Will work pt in to be evaluated by APP.

## 2015-10-06 NOTE — Progress Notes (Addendum)
  [place] OFFICE PROGRESS NOTE   Diagnosis:  Colon cancer  INTERVAL HISTORY:   Gabriel Erickson returns prior to scheduled follow-up evaluation of foot pain. He has completed 3 cycles of adjuvant Xeloda. Following cycle 3 he developed hand-foot syndrome. Cycle 4 was delayed by one week with an anticipated start date of 10/07/2015 and a 50% dose reduction.  He reports developing foot and hand pain over the past weekend. The skin is peeling on his hands and feet. The redness is better. No mouth sores. No diarrhea.  Objective:  Vital signs in last 24 hours:  Blood pressure 147/69, pulse 88, temperature 98.2 F (36.8 C), temperature source Oral, resp. rate 18, height 6' (1.829 m), weight 172 lb 4.8 oz (78.155 kg), SpO2 100 %.    HEENT: No thrush or ulcers. Resp: Lungs clear bilaterally. Cardio: Regular rate and rhythm. GI: Abdomen soft and nontender. Vascular: No leg edema.  Skin: Palms and soles with mild erythema. Palms with dryness, scattered linear breaks in the skin. Soles with dry desquamation at the heels. Prominent callus formation at the great toes bilaterally.    Lab Results:  Lab Results  Component Value Date   WBC 4.7 09/28/2015   HGB 9.8* 09/28/2015   HCT 31.9* 09/28/2015   MCV 80.1 09/28/2015   PLT 317 09/28/2015   NEUTROABS 2.4 09/28/2015    Imaging:  No results found.  Medications: I have reviewed the patient's current medications.  Assessment/Plan: 1. Adenocarcinoma of the ascending colon, stage IIIB (T3, N1b), status post a partial colectomy 06/24/2015  2 of 27 lymph nodes contained metastatic carcinoma, lymphovascular and perineural invasion present  Microsatellite stable with no loss of mismatch repair protein expression  Cycle 1 adjuvant Xeloda 07/29/2015  Cycle 2 adjuvant Xeloda 08/19/2015  Cycle 3 adjuvant Xeloda 09/09/2015  Cycle 4 adjuvant Xeloda 10/14/2015 (50% dose reduction due to hand-foot syndrome following cycle 3)  2.  Diabetes  3. Hemachromatosis heterozygote  4. Family history of colon, ovarian, and biliary tract cancers  5. Indeterminate lung lesions on the chest CT 05/15/2015  6. Anemia, due to iron deficiency    Disposition: Gabriel Erickson has completed 3 cycles of adjuvant Xeloda. He developed hand-foot syndrome following cycle 3. He is now experiencing hand and foot pain. He was originally scheduled to begin cycle 4 Xeloda on 10/07/2015 at a 50% dose reduction. We decided to delay cycle 4 another week with an anticipated start date on 10/14/2015 if the pain has resolved. He is scheduled to return for a follow-up visit on 10/22/2015. He will contact the office in the interim with any problems.  Patient seen with Dr. Benay Spice.    [name] ANP/GNP-BC   10/06/2015  3:11 PM  This was a shared visit with [name]. Gabriel Erickson was interviewed and examined.  [name], M.D.

## 2015-10-14 ENCOUNTER — Telehealth: Payer: Self-pay | Admitting: *Deleted

## 2015-10-14 NOTE — Telephone Encounter (Signed)
Oncology Nurse Navigator Documentation  Oncology Nurse Navigator Flowsheets 10/14/2015  Navigator Location [facility]  Navigator Encounter Type Telephone  Telephone Outgoing Call;Patient Update  Abnormal Finding Date -  Confirmed Diagnosis Date -  Surgery Date -  Treatment Initiated Date -  Patient Visit Type -  Treatment Phase Active Tx-resumed Xeloda 1000 mb bid today  Barriers/Navigation Needs No barriers at this time  Education -  Interventions Other--reminded to avoid extreme heat or chemicals to his hands and feet. Moisturize often and call if he develops sores or tenderness.  Education Method -verbal  Support Groups/Services -  Acuity -  Time Spent with Patient 15  Reports his hands/feet are better-just dark now, but peeling has improved significantly. Will call for any questions or problems.

## 2015-10-21 ENCOUNTER — Encounter: Payer: Self-pay | Admitting: Pharmacist

## 2015-10-21 NOTE — Progress Notes (Signed)
Oral Chemotherapy Follow-Up Form  Original Start date of oral chemotherapy: _1/11/17__  Called patient today to follow up regarding patient's oral chemotherapy medication: _Xeloda_  Pt is doing well today. Pt has some mild fatigue and some hand/foot syndrome with cycle 3 resulting in dose reduction and holding start of cycle 4 until 3/29. He uses miralax if needed and zofran/compazine if needed for nausea as well as Eucerin cream twice daily for his feet. No missed doses this week. Pt sees [name] tomorrow on 4/6. He states his hand/foot syndrome has not worsened this week since restarting Xeloda at 1000 mg BID. His feet/heels are still sore. Will continue to monitor side effects.   Pt reports _0__ tablets/doses missed in the last week/month.    Pt reports the following side effects: __fatigue and hand/foot syndrome___    Will follow up and call patient again in _2 weeks___   Thank you,  Montel Clock, PharmD, [facility]

## 2015-10-22 ENCOUNTER — Telehealth: Payer: Self-pay | Admitting: Nurse Practitioner

## 2015-10-22 ENCOUNTER — Ambulatory Visit (HOSPITAL_BASED_OUTPATIENT_CLINIC_OR_DEPARTMENT_OTHER): Payer: Medicare Other | Admitting: Nurse Practitioner

## 2015-10-22 ENCOUNTER — Other Ambulatory Visit (HOSPITAL_BASED_OUTPATIENT_CLINIC_OR_DEPARTMENT_OTHER): Payer: Medicare Other

## 2015-10-22 VITALS — BP 136/64 | HR 78 | Temp 97.7°F | Resp 20 | Ht 72.0 in | Wt 173.1 lb

## 2015-10-22 DIAGNOSIS — M79671 Pain in right foot: Secondary | ICD-10-CM

## 2015-10-22 DIAGNOSIS — L271 Localized skin eruption due to drugs and medicaments taken internally: Secondary | ICD-10-CM | POA: Diagnosis not present

## 2015-10-22 DIAGNOSIS — D509 Iron deficiency anemia, unspecified: Secondary | ICD-10-CM

## 2015-10-22 DIAGNOSIS — C182 Malignant neoplasm of ascending colon: Secondary | ICD-10-CM

## 2015-10-22 DIAGNOSIS — M79672 Pain in left foot: Secondary | ICD-10-CM | POA: Diagnosis not present

## 2015-10-22 LAB — CBC WITH DIFFERENTIAL/PLATELET
BASO%: 1.1 % (ref 0.0–2.0)
BASOS ABS: 0.1 10*3/uL (ref 0.0–0.1)
EOS ABS: 0.1 10*3/uL (ref 0.0–0.5)
EOS%: 1.5 % (ref 0.0–7.0)
HCT: 33.2 % — ABNORMAL LOW (ref 38.4–49.9)
HGB: 10.2 g/dL — ABNORMAL LOW (ref 13.0–17.1)
LYMPH%: 35.9 % (ref 14.0–49.0)
MCH: 24.3 pg — AB (ref 27.2–33.4)
MCHC: 30.6 g/dL — AB (ref 32.0–36.0)
MCV: 79.2 fL — AB (ref 79.3–98.0)
MONO#: 0.7 10*3/uL (ref 0.1–0.9)
MONO%: 15.5 % — ABNORMAL HIGH (ref 0.0–14.0)
NEUT#: 2.1 10*3/uL (ref 1.5–6.5)
NEUT%: 46 % (ref 39.0–75.0)
PLATELETS: 328 10*3/uL (ref 140–400)
RBC: 4.19 10*6/uL — AB (ref 4.20–5.82)
RDW: 28.4 % — ABNORMAL HIGH (ref 11.0–14.6)
WBC: 4.6 10*3/uL (ref 4.0–10.3)
lymph#: 1.6 10*3/uL (ref 0.9–3.3)

## 2015-10-22 LAB — COMPREHENSIVE METABOLIC PANEL
ALT: 14 U/L (ref 0–55)
ANION GAP: 6 meq/L (ref 3–11)
AST: 20 U/L (ref 5–34)
Albumin: 3.6 g/dL (ref 3.5–5.0)
Alkaline Phosphatase: 84 U/L (ref 40–150)
BILIRUBIN TOTAL: 0.52 mg/dL (ref 0.20–1.20)
BUN: 11.7 mg/dL (ref 7.0–26.0)
CHLORIDE: 107 meq/L (ref 98–109)
CO2: 26 meq/L (ref 22–29)
Calcium: 9.1 mg/dL (ref 8.4–10.4)
Creatinine: 0.9 mg/dL (ref 0.7–1.3)
EGFR: 81 mL/min/{1.73_m2} — AB (ref >90)
Glucose: 185 mg/dl — ABNORMAL HIGH (ref 70–140)
POTASSIUM: 4.7 meq/L (ref 3.5–5.1)
Sodium: 139 mEq/L (ref 136–145)
TOTAL PROTEIN: 7.7 g/dL (ref 6.4–8.3)

## 2015-10-22 NOTE — Progress Notes (Addendum)
  [facility] OFFICE PROGRESS NOTE   Diagnosis:  Colon cancer  INTERVAL HISTORY:   Gabriel Erickson returns as scheduled. He began cycle 4 adjuvant Xeloda 10/14/2015. He has occasional nausea. No vomiting. No mouth sores. He does note that the right buccal mucosa feels "sore". No diarrhea. Hands are better. He continues to have bilateral heel pain.  Objective:  Vital signs in last 24 hours:  Blood pressure 136/64, pulse 78, temperature 97.7 F (36.5 C), temperature source Oral, resp. rate 20, height 6' (1.829 m), weight 173 lb 1.6 oz (78.518 kg), SpO2 100 %.    HEENT: No thrush or ulcers. Resp: Lungs clear bilaterally. Cardio: Regular rate and rhythm. GI: Abdomen soft and nontender. No hepatomegaly. Vascular: No leg edema. Skin: Palms with skin thickening, mild erythema. No skin breakdown. Soles with dry desquamation, erythema, tenderness.    Lab Results:  Lab Results  Component Value Date   WBC 4.6 10/22/2015   HGB 10.2* 10/22/2015   HCT 33.2* 10/22/2015   MCV 79.2* 10/22/2015   PLT 328 10/22/2015   NEUTROABS 2.1 10/22/2015    Imaging:  No results found.  Medications: I have reviewed the patient's current medications.  Assessment/Plan: 1. Adenocarcinoma of the ascending colon, stage IIIB (T3, N1b), status post a partial colectomy 06/24/2015  2 of 27 lymph nodes contained metastatic carcinoma, lymphovascular and perineural invasion present  Microsatellite stable with no loss of mismatch repair protein expression  Cycle 1 adjuvant Xeloda 07/29/2015  Cycle 2 adjuvant Xeloda 08/19/2015  Cycle 3 adjuvant Xeloda 09/09/2015  Cycle 4 adjuvant Xeloda 10/14/2015 (50% dose reduction due to hand-foot syndrome following cycle 3); cycle 4 placed on hold 10/22/2015 due to foot pain  2. Diabetes  3. Hemachromatosis heterozygote  4. Family history of colon, ovarian, and biliary tract cancers  5. Indeterminate lung lesions on the chest CT  05/15/2015  6. Anemia, due to iron deficiency   Disposition: Gabriel Erickson is currently completing cycle 4 adjuvant Xeloda. He has recurrent bilateral foot pain. We decided to place Xeloda on hold. He will return for a follow-up visit in 2 weeks. If the pain has resolved the plan is to resume Xeloda at the reduced dose.  Patient seen with Dr. Benay Spice.    [name] ANP/GNP-BC   10/22/2015  11:59 AM  This was a shared visit with [name]. Gabriel Erickson was interviewed and examined. He continues to have symptomatic plantar symptoms related to Xeloda. He will discontinue Xeloda and return for an office visit in 2 weeks.  [name], M.D.

## 2015-10-22 NOTE — Telephone Encounter (Signed)
per pof to sch pt appt-gave pt copy of avs °

## 2015-11-06 ENCOUNTER — Other Ambulatory Visit (HOSPITAL_BASED_OUTPATIENT_CLINIC_OR_DEPARTMENT_OTHER): Payer: Medicare Other

## 2015-11-06 ENCOUNTER — Encounter: Payer: Self-pay | Admitting: Nurse Practitioner

## 2015-11-06 ENCOUNTER — Ambulatory Visit (HOSPITAL_BASED_OUTPATIENT_CLINIC_OR_DEPARTMENT_OTHER): Payer: Medicare Other | Admitting: Nurse Practitioner

## 2015-11-06 ENCOUNTER — Telehealth: Payer: Self-pay | Admitting: Oncology

## 2015-11-06 ENCOUNTER — Other Ambulatory Visit: Payer: Self-pay

## 2015-11-06 VITALS — BP 131/59 | HR 73 | Resp 20 | Ht 72.0 in | Wt 173.1 lb

## 2015-11-06 DIAGNOSIS — D509 Iron deficiency anemia, unspecified: Secondary | ICD-10-CM | POA: Diagnosis not present

## 2015-11-06 DIAGNOSIS — L271 Localized skin eruption due to drugs and medicaments taken internally: Secondary | ICD-10-CM | POA: Diagnosis not present

## 2015-11-06 DIAGNOSIS — C182 Malignant neoplasm of ascending colon: Secondary | ICD-10-CM | POA: Diagnosis not present

## 2015-11-06 LAB — CBC WITH DIFFERENTIAL/PLATELET
BASO%: 0.5 % (ref 0.0–2.0)
BASOS ABS: 0 10*3/uL (ref 0.0–0.1)
EOS%: 2 % (ref 0.0–7.0)
Eosinophils Absolute: 0.1 10*3/uL (ref 0.0–0.5)
HCT: 33.6 % — ABNORMAL LOW (ref 38.4–49.9)
HGB: 10.2 g/dL — ABNORMAL LOW (ref 13.0–17.1)
LYMPH%: 36.1 % (ref 14.0–49.0)
MCH: 24.1 pg — AB (ref 27.2–33.4)
MCHC: 30.2 g/dL — AB (ref 32.0–36.0)
MCV: 79.7 fL (ref 79.3–98.0)
MONO#: 0.7 10*3/uL (ref 0.1–0.9)
MONO%: 14.9 % — ABNORMAL HIGH (ref 0.0–14.0)
NEUT#: 2.2 10*3/uL (ref 1.5–6.5)
NEUT%: 46.5 % (ref 39.0–75.0)
Platelets: 283 10*3/uL (ref 140–400)
RBC: 4.22 10*6/uL (ref 4.20–5.82)
RDW: 27.6 % — AB (ref 11.0–14.6)
WBC: 4.6 10*3/uL (ref 4.0–10.3)
lymph#: 1.7 10*3/uL (ref 0.9–3.3)

## 2015-11-06 MED ORDER — CAPECITABINE 500 MG PO TABS: ORAL_TABLET | ORAL | Status: DC

## 2015-11-06 MED FILL — XELODA 500 MG TABLET: 500 | 21 days supply | Qty: 42 | Fill #0

## 2015-11-06 NOTE — Telephone Encounter (Signed)
Gave and pritned appt sched and avs for pt for May

## 2015-11-06 NOTE — Progress Notes (Signed)
  [place] OFFICE PROGRESS NOTE   Diagnosis:  Colon cancer  INTERVAL HISTORY:   Gabriel Erickson returns as scheduled. He was last seen on 10/22/2015 at which time he was completing cycle 4 adjuvant Xeloda. Xeloda was placed on hold at that time due to hand-foot syndrome manifesting with foot pain/tenderness and dry desquamation. Feet are less painful and no longer peeling. No hand pain or redness. No nausea or vomiting. No diarrhea.  Objective:  Vital signs in last 24 hours:  Blood pressure 131/59, pulse 73, resp. rate 20, height 6' (1.829 m), weight 173 lb 1.6 oz (78.518 kg), SpO2 100 %.    HEENT: No thrush or ulcers. Resp: Lungs clear bilaterally. Cardio: Regular rate and rhythm. GI: Abdomen soft and nontender. No hepatomegaly. Vascular: No leg edema.  Skin: Palms without erythema; mild skin thickening; no skin breakdown. Soles with mild erythema; no skin breakdown.    Lab Results:  Lab Results  Component Value Date   WBC 4.6 11/06/2015   HGB 10.2* 11/06/2015   HCT 33.6* 11/06/2015   MCV 79.7 11/06/2015   PLT 283 11/06/2015   NEUTROABS 2.2 11/06/2015    Imaging:  No results found.  Medications: I have reviewed the patient's current medications.  Assessment/Plan: 1. Adenocarcinoma of the ascending colon, stage IIIB (T3, N1b), status post a partial colectomy 06/24/2015  2 of 27 lymph nodes contained metastatic carcinoma, lymphovascular and perineural invasion present  Microsatellite stable with no loss of mismatch repair protein expression  Cycle 1 adjuvant Xeloda 07/29/2015  Cycle 2 adjuvant Xeloda 08/19/2015  Cycle 3 adjuvant Xeloda 09/09/2015  Cycle 4 adjuvant Xeloda 10/14/2015 (50% dose reduction due to hand-foot syndrome following cycle 3); cycle 4 placed on hold 10/22/2015 due to foot pain  Cycle 5 adjuvant Xeloda 11/07/2015 (further dose reduced to 1000 mg every morning and 500 mg every afternoon)  2. Diabetes  3. Hemachromatosis  heterozygote  4. Family history of colon, ovarian, and biliary tract cancers  5. Indeterminate lung lesions on the chest CT 05/15/2015  6. Anemia, due to iron deficiency   Disposition: Gabriel Erickson appears stable. The foot symptoms are better. The plan is to resume Xeloda 11/07/2015 at a further dose reduction of 1000 mg every morning and 500 mg every afternoon for 14 days followed by a 7 day break. He will return for a follow-up visit on 11/24/2015. He understands to stop Xeloda and contact the office if symptoms recur.  Plan reviewed with Dr. Benay Spice.    [name] ANP/GNP-BC   11/06/2015  12:07 PM

## 2015-11-11 ENCOUNTER — Telehealth: Payer: Self-pay | Admitting: Pharmacist

## 2015-11-11 ENCOUNTER — Encounter: Payer: Self-pay | Admitting: Pharmacist

## 2015-11-11 NOTE — Progress Notes (Signed)
Attempted to call Mr Duffner for Xeloda f/u.  No answer and not able to leave VM  Mr Noor has started next cycle of Xeloda at dose reduction on 11/07/15.  He has appt with [name], NP on 5/9.  Will f/u with Mr Ullery after next cycle of Xeloda begins.

## 2015-11-24 ENCOUNTER — Telehealth: Payer: Self-pay | Admitting: Oncology

## 2015-11-24 ENCOUNTER — Other Ambulatory Visit (HOSPITAL_BASED_OUTPATIENT_CLINIC_OR_DEPARTMENT_OTHER): Payer: Medicare Other

## 2015-11-24 ENCOUNTER — Ambulatory Visit (HOSPITAL_BASED_OUTPATIENT_CLINIC_OR_DEPARTMENT_OTHER): Payer: Medicare Other | Admitting: Nurse Practitioner

## 2015-11-24 ENCOUNTER — Other Ambulatory Visit: Payer: Self-pay

## 2015-11-24 VITALS — BP 137/75 | HR 70 | Temp 97.5°F | Resp 20 | Ht 72.0 in | Wt 173.6 lb

## 2015-11-24 DIAGNOSIS — C182 Malignant neoplasm of ascending colon: Secondary | ICD-10-CM | POA: Diagnosis not present

## 2015-11-24 DIAGNOSIS — L271 Localized skin eruption due to drugs and medicaments taken internally: Secondary | ICD-10-CM | POA: Diagnosis not present

## 2015-11-24 DIAGNOSIS — D509 Iron deficiency anemia, unspecified: Secondary | ICD-10-CM | POA: Diagnosis not present

## 2015-11-24 LAB — CBC WITH DIFFERENTIAL/PLATELET
BASO%: 0.6 % (ref 0.0–2.0)
Basophils Absolute: 0 10*3/uL (ref 0.0–0.1)
EOS%: 1.5 % (ref 0.0–7.0)
Eosinophils Absolute: 0.1 10*3/uL (ref 0.0–0.5)
HEMATOCRIT: 36.2 % — AB (ref 38.4–49.9)
HGB: 10.9 g/dL — ABNORMAL LOW (ref 13.0–17.1)
LYMPH#: 1.7 10*3/uL (ref 0.9–3.3)
LYMPH%: 34.2 % (ref 14.0–49.0)
MCH: 24.2 pg — AB (ref 27.2–33.4)
MCHC: 30.2 g/dL — ABNORMAL LOW (ref 32.0–36.0)
MCV: 80.2 fL (ref 79.3–98.0)
MONO#: 0.8 10*3/uL (ref 0.1–0.9)
MONO%: 15.5 % — ABNORMAL HIGH (ref 0.0–14.0)
NEUT%: 48.2 % (ref 39.0–75.0)
NEUTROS ABS: 2.5 10*3/uL (ref 1.5–6.5)
PLATELETS: 319 10*3/uL (ref 140–400)
RBC: 4.52 10*6/uL (ref 4.20–5.82)
RDW: 26.1 % — ABNORMAL HIGH (ref 11.0–14.6)
WBC: 5.1 10*3/uL (ref 4.0–10.3)

## 2015-11-24 LAB — COMPREHENSIVE METABOLIC PANEL
ALBUMIN: 3.9 g/dL (ref 3.5–5.0)
ALT: 16 U/L (ref 0–55)
AST: 20 U/L (ref 5–34)
Alkaline Phosphatase: 84 U/L (ref 40–150)
Anion Gap: 6 mEq/L (ref 3–11)
BUN: 12 mg/dL (ref 7.0–26.0)
CHLORIDE: 106 meq/L (ref 98–109)
CO2: 28 meq/L (ref 22–29)
Calcium: 9.4 mg/dL (ref 8.4–10.4)
Creatinine: 0.9 mg/dL (ref 0.7–1.3)
EGFR: 85 mL/min/{1.73_m2} — AB (ref >90)
GLUCOSE: 101 mg/dL (ref 70–140)
Potassium: 4.9 mEq/L (ref 3.5–5.1)
SODIUM: 141 meq/L (ref 136–145)
Total Bilirubin: 0.37 mg/dL (ref 0.20–1.20)
Total Protein: 7.8 g/dL (ref 6.4–8.3)

## 2015-11-24 MED ORDER — CAPECITABINE 500 MG PO TABS: ORAL_TABLET | ORAL | Status: DC

## 2015-11-24 MED FILL — XELODA 500 MG TABLET: 500 | 21 days supply | Qty: 42 | Fill #0

## 2015-11-24 NOTE — Progress Notes (Signed)
  [facility] OFFICE PROGRESS NOTE   Diagnosis:  Colon cancer   INTERVAL HISTORY:   Gabriel Erickson returns as scheduled. He completed cycle 5 adjuvant Xeloda beginning 11/07/2015. Xeloda was further dose reduced with cycle 5 due to hand-foot syndrome. He has intermittent mild nausea while taking Xeloda. The week off he has no nausea. No mouth sores. No diarrhea. Hands and feet continue to "feel better".  Objective:  Vital signs in last 24 hours:  Blood pressure 137/75, pulse 70, temperature 97.5 F (36.4 C), temperature source Oral, resp. rate 20, height 6' (1.829 m), weight 173 lb 9.6 oz (78.744 kg), SpO2 100 %.    HEENT: No thrush or ulcers. Resp: Lungs clear bilaterally. Cardio: Regular rate and rhythm. GI: Abdomen soft and nontender. No hepatomegaly. Vascular: No leg edema. Skin: Palms without erythema. Soles with mild erythema; no skin breakdown.    Lab Results:  Lab Results  Component Value Date   WBC 5.1 11/24/2015   HGB 10.9* 11/24/2015   HCT 36.2* 11/24/2015   MCV 80.2 11/24/2015   PLT 319 11/24/2015   NEUTROABS 2.5 11/24/2015    Imaging:  No results found.  Medications: I have reviewed the patient's current medications.  Assessment/Plan: 1. Adenocarcinoma of the ascending colon, stage IIIB (T3, N1b), status post a partial colectomy 06/24/2015  2 of 27 lymph nodes contained metastatic carcinoma, lymphovascular and perineural invasion present  Microsatellite stable with no loss of mismatch repair protein expression  Cycle 1 adjuvant Xeloda 07/29/2015  Cycle 2 adjuvant Xeloda 08/19/2015  Cycle 3 adjuvant Xeloda 09/09/2015  Cycle 4 adjuvant Xeloda 10/14/2015 (50% dose reduction due to hand-foot syndrome following cycle 3); cycle 4 placed on hold 10/22/2015 due to foot pain  Cycle 5 adjuvant Xeloda 11/07/2015 (further dose reduced to 1000 mg every morning and 500 mg every afternoon)  Cycle 6 adjuvant Xeloda 11/28/2015 (same dose of 1000 mg  every morning and 500 mg every afternoon)  2. Diabetes  3. Hemachromatosis heterozygote  4. Family history of colon, ovarian, and biliary tract cancers  5. Indeterminate lung lesions on the chest CT 05/15/2015  6. Anemia, due to iron deficiency   Disposition: Gabriel Erickson appears stable. He has completed 5 cycles of adjuvant Xeloda. Symptoms of hand-foot syndrome continue to be improved. He will begin cycle 6 adjuvant Xeloda on 11/28/2015, same dose as the previous cycle. He will return for a follow-up visit in approximately 3 weeks. He will contact the office in the interim with any problems.    [name] ANP/GNP-BC   11/24/2015  12:08 PM

## 2015-11-24 NOTE — Telephone Encounter (Signed)
Sent in Xeloda rx. Pt would like to pick it up today if possible.[place], spoke with [facility]. They will begin the order so pt can pick it up today

## 2015-11-24 NOTE — Telephone Encounter (Signed)
Gave and printed appt sched and avs fo rpt for May °

## 2015-12-07 ENCOUNTER — Ambulatory Visit (INDEPENDENT_AMBULATORY_CARE_PROVIDER_SITE_OTHER): Payer: Medicare Other | Admitting: Internal Medicine

## 2015-12-07 ENCOUNTER — Encounter: Payer: Self-pay | Admitting: Internal Medicine

## 2015-12-07 ENCOUNTER — Other Ambulatory Visit (INDEPENDENT_AMBULATORY_CARE_PROVIDER_SITE_OTHER): Payer: Medicare Other

## 2015-12-07 VITALS — BP 110/62 | HR 76 | Wt 173.0 lb

## 2015-12-07 DIAGNOSIS — C186 Malignant neoplasm of descending colon: Secondary | ICD-10-CM | POA: Diagnosis not present

## 2015-12-07 DIAGNOSIS — E1142 Type 2 diabetes mellitus with diabetic polyneuropathy: Secondary | ICD-10-CM

## 2015-12-07 DIAGNOSIS — E119 Type 2 diabetes mellitus without complications: Secondary | ICD-10-CM | POA: Diagnosis not present

## 2015-12-07 DIAGNOSIS — G8929 Other chronic pain: Secondary | ICD-10-CM

## 2015-12-07 DIAGNOSIS — M5440 Lumbago with sciatica, unspecified side: Secondary | ICD-10-CM | POA: Diagnosis not present

## 2015-12-07 LAB — BASIC METABOLIC PANEL
BUN: 12 mg/dL (ref 6–23)
CALCIUM: 9.2 mg/dL (ref 8.4–10.5)
CO2: 29 mEq/L (ref 19–32)
Chloride: 105 mEq/L (ref 96–112)
Creatinine, Ser: 0.84 mg/dL (ref 0.40–1.50)
GFR: 95.3 mL/min (ref >60.00)
Glucose, Bld: 115 mg/dL — ABNORMAL HIGH (ref 70–99)
POTASSIUM: 4.5 meq/L (ref 3.5–5.1)
SODIUM: 137 meq/L (ref 135–145)

## 2015-12-07 LAB — HEPATIC FUNCTION PANEL
ALBUMIN: 3.9 g/dL (ref 3.5–5.2)
ALT: 13 U/L (ref 0–53)
AST: 17 U/L (ref 0–37)
Alkaline Phosphatase: 65 U/L (ref 39–117)
BILIRUBIN TOTAL: 0.4 mg/dL (ref 0.2–1.2)
Bilirubin, Direct: 0.1 mg/dL (ref 0.0–0.3)
Total Protein: 7 g/dL (ref 6.0–8.3)

## 2015-12-07 LAB — CBC WITH DIFFERENTIAL/PLATELET
BASOS PCT: 0.3 % (ref 0.0–3.0)
Basophils Absolute: 0 10*3/uL (ref 0.0–0.1)
EOS ABS: 0 10*3/uL (ref 0.0–0.7)
EOS PCT: 0.7 % (ref 0.0–5.0)
HEMATOCRIT: 34.5 % — AB (ref 39.0–52.0)
Hemoglobin: 10.9 g/dL — ABNORMAL LOW (ref 13.0–17.0)
Lymphocytes Relative: 38.3 % (ref 12.0–46.0)
Lymphs Abs: 2 10*3/uL (ref 0.7–4.0)
MCHC: 31.7 g/dL (ref 30.0–36.0)
MCV: 77.9 fl — ABNORMAL LOW (ref 78.0–100.0)
MONO ABS: 0.8 10*3/uL (ref 0.1–1.0)
Monocytes Relative: 15.4 % — ABNORMAL HIGH (ref 3.0–12.0)
NEUTROS ABS: 2.4 10*3/uL (ref 1.4–7.7)
Neutrophils Relative %: 45.3 % (ref 43.0–77.0)
PLATELETS: 288 10*3/uL (ref 150.0–400.0)
RBC: 4.43 Mil/uL (ref 4.22–5.81)
RDW: 24.6 % — AB (ref 11.5–15.5)
WBC: 5.2 10*3/uL (ref 4.0–10.5)

## 2015-12-07 LAB — HEMOGLOBIN A1C: Hgb A1c MFr Bld: 7.4 % — ABNORMAL HIGH (ref 4.6–6.5)

## 2015-12-07 MED ORDER — METHADONE HCL 10 MG PO TABS: 20.0000 mg | ORAL_TABLET | Freq: Four times a day (QID) | ORAL | Status: DC | PRN

## 2015-12-07 MED ORDER — GLIMEPIRIDE 4 MG PO TABS: 4.0000 mg | ORAL_TABLET | Freq: Two times a day (BID) | ORAL | Status: DC

## 2015-12-07 NOTE — Assessment & Plan Note (Signed)
Methadone prn

## 2015-12-07 NOTE — Assessment & Plan Note (Signed)
Dr Benay Spice - on Xeloda po

## 2015-12-07 NOTE — Assessment & Plan Note (Signed)
Cont Amaryl

## 2015-12-07 NOTE — Progress Notes (Signed)
Subjective:  Patient ID: Gabriel Erickson, male    DOB: 1941-04-03  Age: 75 y.o. MRN: [id number]  CC: No chief complaint on file.   HPI Gabriel Erickson presents for colon Ca on Chemo - Xeloda, LBP, GERD f/u  Outpatient Prescriptions Prior to Visit  Medication Sig Dispense Refill  . ACCU-CHEK SOFTCLIX LANCETS lancets by Other route 2 (two) times daily. Use as instructed     . b complex vitamins tablet Take 1 tablet by mouth daily.      . capecitabine (XELODA) 500 MG tablet Take 2 500 mg tablets(1000mg  total) in the AM and 1 500mg  tablet in the PM.(1500mg  total daily)Take for 14 days then 7 days off.Start 5/13 42 tablet 0  . Cholecalciferol 1000 UNITS tablet Take 1,000 Units by mouth daily.      . diazepam (VALIUM) 5 MG tablet Take 1 tablet (5 mg total) by mouth 2 (two) times daily as needed for muscle spasms (spasms). For cramps or insomnia 60 tablet 3  . fluticasone (FLONASE) 50 MCG/ACT nasal spray Place 2 sprays into the nose daily. 16 g 11  . glimepiride (AMARYL) 4 MG tablet Take 1 tablet (4 mg total) by mouth 2 (two) times daily. 180 tablet 3  . glucose blood (ONE TOUCH ULTRA TEST) test strip 1 each by Other route 2 (two) times daily. Dx 250.00 fluctuating blood sugar     . loperamide (IMODIUM A-D) 2 MG tablet Take 2-4 mg by mouth 4 (four) times daily as needed for diarrhea or loose stools.    . methadone (DOLOPHINE) 10 MG tablet Take 2 tablets (20 mg total) by mouth 4 (four) times daily as needed for severe pain. For pain - FILL ON OR AFTER 11/16/2015 240 tablet 0  . ondansetron (ZOFRAN) 4 MG tablet Take 1 tablet (4 mg total) by mouth every 6 (six) hours as needed for nausea. 20 tablet 6  . prochlorperazine (COMPAZINE) 10 MG tablet Take 1 tablet (10 mg total) by mouth every 6 (six) hours as needed for nausea or vomiting. 30 tablet 0  . pseudoephedrine-guaifenesin (MUCINEX D) 60-600 MG per tablet Take 1 tablet by mouth every 12 (twelve) hours as needed for congestion.     . ranitidine (ZANTAC) 75  MG tablet Take 75 mg by mouth 2 (two) times daily.     No facility-administered medications prior to visit.    ROS Review of Systems  Constitutional: Positive for fatigue. Negative for appetite change and unexpected weight change.  HENT: Negative for congestion, nosebleeds, sneezing, sore throat and trouble swallowing.   Eyes: Negative for itching and visual disturbance.  Respiratory: Negative for cough.   Cardiovascular: Negative for chest pain, palpitations and leg swelling.  Gastrointestinal: Positive for nausea. Negative for diarrhea, blood in stool and abdominal distention.  Genitourinary: Negative for frequency and hematuria.  Musculoskeletal: Positive for back pain and gait problem. Negative for joint swelling and neck pain.  Skin: Negative for rash.  Neurological: Positive for weakness and numbness. Negative for dizziness, tremors and speech difficulty.  Psychiatric/Behavioral: Negative for sleep disturbance, dysphoric mood and agitation. The patient is not nervous/anxious.     Objective:  BP 110/62 mmHg  Pulse 76  Wt 173 lb (78.472 kg)  SpO2 97%  BP Readings from Last 3 Encounters:  12/07/15 110/62  11/24/15 137/75  11/06/15 131/59    Wt Readings from Last 3 Encounters:  12/07/15 173 lb (78.472 kg)  11/24/15 173 lb 9.6 oz (78.744 kg)  11/06/15 173 lb  1.6 oz (78.518 kg)    Physical Exam  Constitutional: He is oriented to person, place, and time. He appears well-developed. No distress.  NAD  HENT:  Mouth/Throat: Oropharynx is clear and moist.  Eyes: Conjunctivae are normal. Pupils are equal, round, and reactive to light.  Neck: Normal range of motion. No JVD present. No thyromegaly present.  Cardiovascular: Normal rate, regular rhythm, normal heart sounds and intact distal pulses.  Exam reveals no gallop and no friction rub.   No murmur heard. Pulmonary/Chest: Effort normal and breath sounds normal. No respiratory distress. He has no wheezes. He has no rales. He  exhibits no tenderness.  Abdominal: Soft. Bowel sounds are normal. He exhibits no distension and no mass. There is no tenderness. There is no rebound and no guarding.  Musculoskeletal: Normal range of motion. He exhibits tenderness. He exhibits no edema.  Lymphadenopathy:    He has no cervical adenopathy.  Neurological: He is alert and oriented to person, place, and time. He has normal reflexes. No cranial nerve deficit. He exhibits normal muscle tone. He displays a negative Romberg sign. Coordination abnormal. Gait normal.  Skin: Skin is warm and dry. No rash noted.  Psychiatric: He has a normal mood and affect. His behavior is normal. Judgment and thought content normal.  LS tender Weak LEs  Lab Results  Component Value Date   WBC 5.2 12/07/2015   HGB 10.9* 12/07/2015   HCT 34.5* 12/07/2015   PLT 288.0 12/07/2015   GLUCOSE 115* 12/07/2015   CHOL 199 02/25/2014   TRIG 239* 02/25/2014   HDL 43 02/25/2014   LDLDIRECT 142.3 04/12/2010   LDLCALC 108* 02/25/2014   ALT 13 12/07/2015   AST 17 12/07/2015   NA 137 12/07/2015   K 4.5 12/07/2015   CL 105 12/07/2015   CREATININE 0.84 12/07/2015   BUN 12 12/07/2015   CO2 29 12/07/2015   TSH 0.39 06/03/2015   PSA 1.33 12/12/2014   HGBA1C 7.4* 12/07/2015    US Soft Tissue Head/neck  08/17/2015  CLINICAL DATA:  75 year old male with a history of thyroid nodule identified on chest CT. EXAM: THYROID ULTRASOUND TECHNIQUE: Ultrasound examination of the thyroid gland and adjacent soft tissues was performed. COMPARISON:  CTA 05/15/2015 FINDINGS: Right thyroid lobe Measurements: 4.8 cm x 2.2 cm x 2.5 cm. Multiple small nodules measured of the right thyroid. Upper pole nodule:  7 mm x 4 mm x 6 mm. Mid thyroid: 1.4 cm x 1.2 cm x 1.1 cm. Solid features without internal calcifications. Lower right thyroid:  8 mm x 6 mm x 6 mm with cystic features. Left thyroid lobe Measurements: 4.8 cm x 2.5 cm x 2.0 cm. Small nodules of the left thyroid measured. Upper  pole: 1.1 cm x 6 mm x 8 mm. No internal calcifications. Well-defined border. Mid left thyroid:  9 mm x 6 mm x 8 mm. Lower pole: 1.1 cm x 6 mm x 9 mm. No internal calcifications, complex features, and without ill-defined border. Isthmus Thickness: 5 mm.  No nodules visualized. Lymphadenopathy None visualized. IMPRESSION: Multinodular thyroid. Dominant right-sided nodule does not meet criteria for biopsy. Findings do not meet current SRU consensus criteria for biopsy. Follow-up by clinical exam is recommended. In a patient of this age with no risk factors for thyroid carcinoma, lengthening the surveillance interval beyond 12 months is a reasonable strategy, as thyroid cancers <2cm are known to be indolent with nearly 100% 10-year survival. If the patient has known risk factors for thyroid carcinoma, a  follow-up ultrasound in 12 months is recommended. - Managing incidental thyroid nodules detected on imaging: white paper of the ACR Incidental Thyroid Findings Committee. J Am Coll Radiol. 2015 Feb;12(2):143-50. - Management of Thyroid Nodules Detected at [place]: Society of Radiologists in [place]. Radiology 2005; [id number]. Signed, Dulcy Fanny. Earleen Newport, DO [facility] Rad[facility] Electronically Signed   By: Corrie Mckusick D.O.   On: 08/17/2015 12:22    Assessment & Plan:   There are no diagnoses linked to this encounter. I am having Mr. Dia maintain his ACCU-CHEK SOFTCLIX LANCETS, glucose blood, Cholecalciferol, b complex vitamins, pseudoephedrine-guaifenesin, fluticasone, diazepam, loperamide, prochlorperazine, ondansetron, ranitidine, glimepiride, methadone, and capecitabine.  No orders of the defined types were placed in this encounter.     Follow-up: No Follow-up on file.  Walker Kehr, MD

## 2015-12-07 NOTE — Assessment & Plan Note (Signed)
Low grade anemia

## 2015-12-07 NOTE — Progress Notes (Signed)
Pre visit review using our clinic review tool, if applicable. No additional management support is needed unless otherwise documented below in the visit note. 

## 2015-12-16 ENCOUNTER — Telehealth: Payer: Self-pay | Admitting: Oncology

## 2015-12-16 ENCOUNTER — Ambulatory Visit (HOSPITAL_BASED_OUTPATIENT_CLINIC_OR_DEPARTMENT_OTHER): Payer: Medicare Other | Admitting: Oncology

## 2015-12-16 ENCOUNTER — Other Ambulatory Visit (HOSPITAL_BASED_OUTPATIENT_CLINIC_OR_DEPARTMENT_OTHER): Payer: Medicare Other

## 2015-12-16 VITALS — BP 142/72 | HR 73 | Temp 98.3°F | Resp 18 | Ht 72.0 in | Wt 174.3 lb

## 2015-12-16 DIAGNOSIS — C182 Malignant neoplasm of ascending colon: Secondary | ICD-10-CM | POA: Diagnosis not present

## 2015-12-16 DIAGNOSIS — D509 Iron deficiency anemia, unspecified: Secondary | ICD-10-CM | POA: Diagnosis not present

## 2015-12-16 LAB — COMPREHENSIVE METABOLIC PANEL
ALT: 16 U/L (ref 0–55)
ANION GAP: 6 meq/L (ref 3–11)
AST: 20 U/L (ref 5–34)
Albumin: 3.8 g/dL (ref 3.5–5.0)
Alkaline Phosphatase: 75 U/L (ref 40–150)
BILIRUBIN TOTAL: 0.45 mg/dL (ref 0.20–1.20)
BUN: 10.9 mg/dL (ref 7.0–26.0)
CALCIUM: 9.4 mg/dL (ref 8.4–10.4)
CHLORIDE: 104 meq/L (ref 98–109)
CO2: 29 meq/L (ref 22–29)
CREATININE: 0.9 mg/dL (ref 0.7–1.3)
EGFR: 81 mL/min/{1.73_m2} — AB (ref >90)
Glucose: 103 mg/dl (ref 70–140)
Potassium: 4.2 mEq/L (ref 3.5–5.1)
Sodium: 140 mEq/L (ref 136–145)
TOTAL PROTEIN: 7.8 g/dL (ref 6.4–8.3)

## 2015-12-16 LAB — CBC WITH DIFFERENTIAL/PLATELET
BASO%: 0.5 % (ref 0.0–2.0)
BASOS ABS: 0 10*3/uL (ref 0.0–0.1)
EOS ABS: 0.1 10*3/uL (ref 0.0–0.5)
EOS%: 1.7 % (ref 0.0–7.0)
HEMATOCRIT: 35.8 % — AB (ref 38.4–49.9)
HGB: 11.2 g/dL — ABNORMAL LOW (ref 13.0–17.1)
LYMPH%: 31.3 % (ref 14.0–49.0)
MCH: 24.7 pg — ABNORMAL LOW (ref 27.2–33.4)
MCHC: 31.3 g/dL — AB (ref 32.0–36.0)
MCV: 79 fL — AB (ref 79.3–98.0)
MONO#: 0.7 10*3/uL (ref 0.1–0.9)
MONO%: 13.7 % (ref 0.0–14.0)
NEUT#: 2.9 10*3/uL (ref 1.5–6.5)
NEUT%: 52.8 % (ref 39.0–75.0)
PLATELETS: 314 10*3/uL (ref 140–400)
RBC: 4.53 10*6/uL (ref 4.20–5.82)
RDW: 25.2 % — AB (ref 11.0–14.6)
WBC: 5.5 10*3/uL (ref 4.0–10.3)
lymph#: 1.7 10*3/uL (ref 0.9–3.3)

## 2015-12-16 MED ORDER — CAPECITABINE 500 MG PO TABS: ORAL_TABLET | ORAL | Status: DC

## 2015-12-16 MED FILL — CAPECITABINE 500 MG TABLET: 500 | 21 days supply | Qty: 42 | Fill #0

## 2015-12-16 NOTE — Progress Notes (Signed)
  Gabriel Erickson   Diagnosis: Colon cancer  INTERVAL HISTORY:   Gabriel Erickson completed another cycle of Xeloda beginning 11/28/2015. No mouth sores, diarrhea, or hand/foot pain. No new complaint.  Objective:  Vital signs in last 24 hours:  Blood pressure 142/72, pulse 73, temperature 98.3 F (36.8 C), temperature source Oral, resp. rate 18, height 6' (1.829 m), weight 174 lb 4.8 oz (79.062 kg), SpO2 100 %.    HEENT: No thrush or ulcers Resp: Lungs clear bilaterally Cardio: Regular rate and rhythm GI: No hepatosplenomegaly, nontender Vascular: No leg edema  Skin: Mild erythema and skin thickening at the palms and soles, no skin breakdown     Lab Results:  Lab Results  Component Value Date   WBC 5.5 12/16/2015   HGB 11.2* 12/16/2015   HCT 35.8* 12/16/2015   MCV 79.0* 12/16/2015   PLT 314 12/16/2015   NEUTROABS 2.9 12/16/2015     Medications: I have reviewed the patient's current medications.  Assessment/Plan: 1. Adenocarcinoma of the ascending colon, stage IIIB (T3, N1b), status post a partial colectomy 06/24/2015  2 of 27 lymph nodes contained metastatic carcinoma, lymphovascular and perineural invasion present  Microsatellite stable with no loss of mismatch repair protein expression  Cycle 1 adjuvant Xeloda 07/29/2015  Cycle 2 adjuvant Xeloda 08/19/2015  Cycle 3 adjuvant Xeloda 09/09/2015  Cycle 4 adjuvant Xeloda 10/14/2015 (50% dose reduction due to hand-foot syndrome following cycle 3); cycle 4 placed on hold 10/22/2015 due to foot pain  Cycle 5 adjuvant Xeloda 11/07/2015 (further dose reduced to 1000 mg every morning and 500 mg every afternoon)  Cycle 6 adjuvant Xeloda 11/28/2015 (same dose of 1000 mg every morning and 500 mg every afternoon)  Cycle 7 adjuvant Xeloda 12/19/2015 (no dose change)  2. Diabetes  3. Hemachromatosis heterozygote  4. Family history of colon, ovarian, and biliary tract cancers  5.  Indeterminate lung lesions on the chest CT 05/15/2015  6. Anemia, due to iron deficiency-improved    Disposition:  Gabriel Erickson is tolerating the Xeloda at the current dose. He will begin cycle 7 12/19/2015. He will contact us for hand/foot pain. Gabriel Erickson will be scheduled for an office visit 01/06/2016.  Betsy Coder, MD  12/16/2015  11:45 AM

## 2015-12-16 NOTE — Telephone Encounter (Signed)
Gave pt apt & avs °

## 2015-12-17 ENCOUNTER — Telehealth: Payer: Self-pay | Admitting: Pharmacist

## 2015-12-17 NOTE — Telephone Encounter (Signed)
Left vm to contact [facility] if he has concerns/adverse effects from next cycle of Xeloda to begin 12/19/15. Pt saw Dr. Benay Spice yesterday. We will sign off on follow up calls (start date 07/29/15). Kennith Center, Pharm.D., CPP 12/17/2015@3 :27 PM [facility]

## 2015-12-29 ENCOUNTER — Telehealth: Payer: Self-pay | Admitting: *Deleted

## 2015-12-29 ENCOUNTER — Other Ambulatory Visit (HOSPITAL_BASED_OUTPATIENT_CLINIC_OR_DEPARTMENT_OTHER): Payer: Medicare Other

## 2015-12-29 ENCOUNTER — Ambulatory Visit (HOSPITAL_BASED_OUTPATIENT_CLINIC_OR_DEPARTMENT_OTHER): Payer: Medicare Other | Admitting: Nurse Practitioner

## 2015-12-29 ENCOUNTER — Telehealth: Payer: Self-pay | Admitting: Oncology

## 2015-12-29 VITALS — BP 138/62 | HR 80 | Temp 98.5°F | Resp 18 | Ht 72.0 in | Wt 173.4 lb

## 2015-12-29 DIAGNOSIS — C182 Malignant neoplasm of ascending colon: Secondary | ICD-10-CM

## 2015-12-29 DIAGNOSIS — L739 Follicular disorder, unspecified: Secondary | ICD-10-CM

## 2015-12-29 LAB — CBC WITH DIFFERENTIAL/PLATELET
BASO%: 1 % (ref 0.0–2.0)
Basophils Absolute: 0 10*3/uL (ref 0.0–0.1)
EOS%: 1.2 % (ref 0.0–7.0)
Eosinophils Absolute: 0.1 10*3/uL (ref 0.0–0.5)
HEMATOCRIT: 35.8 % — AB (ref 38.4–49.9)
HGB: 10.9 g/dL — ABNORMAL LOW (ref 13.0–17.1)
LYMPH#: 1.7 10*3/uL (ref 0.9–3.3)
LYMPH%: 35.2 % (ref 14.0–49.0)
MCH: 23.9 pg — ABNORMAL LOW (ref 27.2–33.4)
MCHC: 30.4 g/dL — ABNORMAL LOW (ref 32.0–36.0)
MCV: 78.4 fL — ABNORMAL LOW (ref 79.3–98.0)
MONO#: 0.6 10*3/uL (ref 0.1–0.9)
MONO%: 13.3 % (ref 0.0–14.0)
NEUT%: 49.3 % (ref 39.0–75.0)
NEUTROS ABS: 2.3 10*3/uL (ref 1.5–6.5)
PLATELETS: 271 10*3/uL (ref 140–400)
RBC: 4.57 10*6/uL (ref 4.20–5.82)
RDW: 24.4 % — ABNORMAL HIGH (ref 11.0–14.6)
WBC: 4.8 10*3/uL (ref 4.0–10.3)

## 2015-12-29 LAB — COMPREHENSIVE METABOLIC PANEL
ALBUMIN: 3.7 g/dL (ref 3.5–5.0)
ALK PHOS: 71 U/L (ref 40–150)
ALT: 18 U/L (ref 0–55)
ANION GAP: 7 meq/L (ref 3–11)
AST: 22 U/L (ref 5–34)
BILIRUBIN TOTAL: 0.41 mg/dL (ref 0.20–1.20)
BUN: 13 mg/dL (ref 7.0–26.0)
CALCIUM: 9.2 mg/dL (ref 8.4–10.4)
CO2: 26 mEq/L (ref 22–29)
CREATININE: 1 mg/dL (ref 0.7–1.3)
Chloride: 105 mEq/L (ref 98–109)
EGFR: 75 mL/min/{1.73_m2} — ABNORMAL LOW (ref >90)
Glucose: 150 mg/dl — ABNORMAL HIGH (ref 70–140)
Potassium: 4 mEq/L (ref 3.5–5.1)
Sodium: 139 mEq/L (ref 136–145)
TOTAL PROTEIN: 7.6 g/dL (ref 6.4–8.3)

## 2015-12-29 NOTE — Telephone Encounter (Signed)
Call from pt's wife reporting he has a "pimple-like" rash on BLE. He has also been weak to the point he felt he might pass out. He is eating and drinking without difficulty. Wife denied diarrhea. Pt held today's dose of Xeloda due to rash. Discussed with Dr. Benay Spice: have pt come in to be evaulated in [facility]. Left message on voicemail for pt to call office. Order to schedulers to contact pt as well.

## 2015-12-29 NOTE — Telephone Encounter (Signed)
left msg confiming  lab & [facility] apt today

## 2016-01-01 ENCOUNTER — Encounter: Payer: Self-pay | Admitting: Nurse Practitioner

## 2016-01-01 DIAGNOSIS — L739 Follicular disorder, unspecified: Secondary | ICD-10-CM | POA: Insufficient documentation

## 2016-01-01 NOTE — Assessment & Plan Note (Signed)
Patient states that he has been developing some tiny, scattered rash that occasionally is pruritic to his bilateral lower extremities for the past few weeks.  He states that he will scratch at the sites; and then they will scab and resolve.  He states that they do occasionally have a purulent To the top of these lesions prior to resolving.  He denies any other new symptoms whatsoever.  Denies any recent fevers or chills.  Exam today revealed a few scattered lesions to patient's bilateral legs.  All were in various stages of resolving.  There was one lesion to the right lower leg that had a pustular; but all were otherwise either scabbed or completely resolved.  There was no surrounding  cellulitis.  Patient was advised that this is most likely folliculitis; and that he could apply some warm, moist compresses to the site to see if this helps.  Also, mentioned the patient may try Neosporin to the sites as well; the patient stated that he is allergic to the brand name Neosporin.  He states that he has been able to use bacitracin in the past with no issues.  Patient was advised to call/return if the rash worsens.

## 2016-01-01 NOTE — Progress Notes (Signed)
SYMPTOM MANAGEMENT CLINIC    Chief Complaint: Rash  HPI:  Gabriel Erickson 75 y.o. male diagnosed with colon cancer.  Currently undergoing Xeloda oral therapy.   Patient states that he has been developing some tiny, scattered rash that occasionally is pruritic to his bilateral lower extremities for the past few weeks.  He states that he will scratch at the sites; and then they will scab and resolve.  He states that they do occasionally have a purulent To the top of these lesions prior to resolving.  He denies any other new symptoms whatsoever.  Denies any recent fevers or chills.  Exam today revealed a few scattered lesions to patient's bilateral legs.  All were in various stages of resolving.  There was one lesion to the right lower leg that had a pustular; but all were otherwise either scabbed or completely resolved.  There was no surrounding  cellulitis.  Patient was advised that this is most likely folliculitis; and that he could apply some warm, moist compresses to the site to see if this helps.  Also, mentioned the patient may try Neosporin to the sites as well; the patient stated that he is allergic to the brand name Neosporin.  He states that he has been able to use bacitracin in the past with no issues.  Patient was advised to call/return if the rash worsens.     No history exists.    Review of Systems  Skin: Positive for itching and rash.  All other systems reviewed and are negative.   Past Medical History  Diagnosis Date  . Fatty liver   . Gallstones     Possible vs sludge  . Hypotension   . Diabetes mellitus   . Diverticulosis of colon   . GERD (gastroesophageal reflux disease)   . Hypogonadism male   . LBP (low back pain)   . Osteoarthritis   . Peripheral neuropathy ([place])   . Constipation   . Seasonal allergies   . Urinary frequency   . Cancer (HCC)     COLON CANCER  . Colon cancer, ascending ([place]) 06/24/2015    Past Surgical History  Procedure Laterality Date   . Lumbar laminectomy    . Appendectomy    . Foot surgery  1966    Left  . Tonsillectomy    . Hemorrhoid surgery  2011    [facility]  . Eye surgery Left   . Partial colectomy  06/24/2015  . Cataract extraction Left   . 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    is allergic to penicillins; rosuvastatin; ciprofloxacin; influenza vaccines; and metformin and related.    Medication List       This list is accurate as of: 12/29/15 11:59 PM.  Always use your most recent med list.               ACCU-CHEK SOFTCLIX LANCETS  lancets  by Other route 2 (two) times daily. Use as instructed     b complex vitamins tablet  Take 1 tablet by mouth daily.     capecitabine 500 MG tablet  Commonly known as:  XELODA  Take 2 tablets (1,000 mg) in the AM and 1 tablet (500 mg) in the PM.(1,542m total daily)Take for 14 days then 7 days off.     Cholecalciferol 1000 units tablet  Take 1,000 Units by mouth daily.     diazepam 5 MG tablet  Commonly known as:  VALIUM  Take 1 tablet (5 mg total) by mouth 2 (two) times daily as needed for muscle spasms (spasms). For cramps or insomnia     fluticasone 50 MCG/ACT nasal spray  Commonly known  as:  FLONASE  Place 2 sprays into the nose daily.     glimepiride 4 MG tablet  Commonly known as:  AMARYL  Take 1 tablet (4 mg total) by mouth 2 (two) times daily.     loperamide 2 MG tablet  Commonly known as:  IMODIUM A-D  Take 2-4 mg by mouth 4 (four) times daily as needed for diarrhea or loose stools.     methadone 10 MG tablet  Commonly known as:  DOLOPHINE  Take 2 tablets (20 mg total) by mouth 4 (four) times daily as needed for severe pain. For pain - FILL ON OR AFTER 02/16/2016     ondansetron 4 MG tablet  Commonly known as:  ZOFRAN  Take 1 tablet (4 mg total) by mouth every 6 (six) hours as needed for nausea.     ONE TOUCH ULTRA TEST test strip  Generic drug:  glucose blood  1 each by Other route 2 (two) times daily. Dx 250.00 fluctuating blood sugar     prochlorperazine 10 MG tablet  Commonly known as:  COMPAZINE  Take 1 tablet (10 mg total) by mouth every 6 (six) hours as needed for nausea or vomiting.     pseudoephedrine-guaifenesin 60-600 MG 12 hr tablet  Commonly known as:  MUCINEX D  Take 1 tablet by mouth every 12 (twelve) hours as needed for congestion.     ranitidine 75 MG tablet  Commonly known as:  ZANTAC  Take 75 mg by mouth 2 (two) times daily.         PHYSICAL EXAMINATION  Oncology Vitals 12/29/2015 12/16/2015  Height 183 cm 183 cm  Weight 78.654 kg 79.062 kg  Weight (lbs) 173 lbs 6 oz 174 lbs 5 oz  BMI (kg/m2) 23.52 kg/m2 23.64 kg/m2  Temp 98.5 98.3  Pulse 80 73  Resp 18 18  SpO2 100 100  BSA (m2) 2 m2 2 m2   BP Readings from Last 2 Encounters:  12/29/15 138/62  12/16/15 142/72    Physical Exam  Constitutional: He is oriented to person, place, and time and well-developed, well-nourished, and in no distress.  HENT:  Head: Normocephalic and atraumatic.  Eyes: Conjunctivae and EOM are normal. Pupils are equal, round, and reactive to light. Right eye exhibits no discharge. Left eye exhibits no discharge. No scleral icterus.  Neck: Normal  range of motion.  Pulmonary/Chest: Effort normal. No respiratory distress.  Musculoskeletal: Normal range of motion. He exhibits no edema or tenderness.  Neurological: He is alert and oriented to person, place, and time. Gait normal.  Skin: Skin is warm and dry. Rash noted. No erythema. No pallor.  Exam today revealed a few scattered lesions to patient's bilateral legs.  All were in  various stages of resolving.  There was one lesion to the right lower leg that had a pustular; but all were otherwise either scabbed or completely resolved.  There was no surrounding  cellulitis.      Psychiatric: Affect normal.  Nursing note and vitals reviewed.   LABORATORY DATA:. Appointment on 12/29/2015  Component Date Value Ref Range Status  . WBC 12/29/2015 4.8  4.0 - 10.3 10e3/uL Final  . NEUT# 12/29/2015 2.3  1.5 - 6.5 10e3/uL Final  . HGB 12/29/2015 10.9* 13.0 - 17.1 g/dL Final  . HCT 12/29/2015 35.8* 38.4 - 49.9 % Final  . Platelets 12/29/2015 271  140 - 400 10e3/uL Final  . MCV 12/29/2015 78.4* 79.3 - 98.0 fL Final  . MCH 12/29/2015 23.9* 27.2 - 33.4 pg Final  . MCHC 12/29/2015 30.4* 32.0 - 36.0 g/dL Final  . RBC 12/29/2015 4.57  4.20 - 5.82 10e6/uL Final  . RDW 12/29/2015 24.4* 11.0 - 14.6 % Final  . lymph# 12/29/2015 1.7  0.9 - 3.3 10e3/uL Final  . MONO# 12/29/2015 0.6  0.1 - 0.9 10e3/uL Final  . Eosinophils Absolute 12/29/2015 0.1  0.0 - 0.5 10e3/uL Final  . Basophils Absolute 12/29/2015 0.0  0.0 - 0.1 10e3/uL Final  . NEUT% 12/29/2015 49.3  39.0 - 75.0 % Final  . LYMPH% 12/29/2015 35.2  14.0 - 49.0 % Final  . MONO% 12/29/2015 13.3  0.0 - 14.0 % Final  . EOS% 12/29/2015 1.2  0.0 - 7.0 % Final  . BASO% 12/29/2015 1.0  0.0 - 2.0 % Final  . Sodium 12/29/2015 139  136 - 145 mEq/L Final  . Potassium 12/29/2015 4.0  3.5 - 5.1 mEq/L Final  . Chloride 12/29/2015 105  98 - 109 mEq/L Final  . CO2 12/29/2015 26  22 - 29 mEq/L Final  . Glucose 12/29/2015 150* 70 - 140 mg/dl Final   Glucose  reference range is for nonfasting patients. Fasting glucose reference range is 70- 100.  Marland Kitchen BUN 12/29/2015 13.0  7.0 - 26.0 mg/dL Final  . Creatinine 12/29/2015 1.0  0.7 - 1.3 mg/dL Final  . Total Bilirubin 12/29/2015 0.41  0.20 - 1.20 mg/dL Final  . Alkaline Phosphatase 12/29/2015 71  40 - 150 U/L Final  . AST 12/29/2015 22  5 - 34 U/L Final  . ALT 12/29/2015 18  0 - 55 U/L Final  . Total Protein 12/29/2015 7.6  6.4 - 8.3 g/dL Final  . Albumin 12/29/2015 3.7  3.5 - 5.0 g/dL Final  . Calcium 12/29/2015 9.2  8.4 - 10.4 mg/dL Final  . Anion Gap 12/29/2015 7  3 - 11 mEq/L Final  . EGFR 12/29/2015 75* >90 ml/min/1.73 m2 Final   eGFR is calculated using the CKD-EPI Creatinine Equation (2009)       RADIOGRAPHIC STUDIES: No results found.  ASSESSMENT/PLAN:    Folliculitis Patient states that he has been developing some tiny, scattered rash that occasionally is pruritic to his bilateral lower extremities for the past few weeks.  He states that he will scratch at the sites; and then they will scab and resolve.  He states that they do occasionally have a purulent To the top of these lesions prior to resolving.  He denies any other new symptoms whatsoever.  Denies any recent fevers or chills.  Exam today revealed a few scattered lesions to patient's bilateral legs.  All were in various stages of resolving.  There was one lesion to the right lower leg that had a pustular; but  all were otherwise either scabbed or completely resolved.  There was no surrounding  cellulitis.  Patient was advised that this is most likely folliculitis; and that he could apply some warm, moist compresses to the site to see if this helps.  Also, mentioned the patient may try Neosporin to the sites as well; the patient stated that he is allergic to the brand name Neosporin.  He states that he has been able to use bacitracin in the past with no issues.  Patient was advised to call/return if the rash worsens.    Colon cancer  [facility]) Patient continues taking Xeloda oral therapy as directed.  He states that he is on cycle 7, day 11 of the Xeloda today.  He plans on taking a total of 8 cycles of Xeloda.  Patient is scheduled to return for labs and a follow-up visit on 01/06/2016.   Patient stated understanding of all instructions; and was in agreement with this plan of care. The patient knows to call the clinic with any problems, questions or concerns.   Total time spent with patient was 25 minutes;  with greater than 75 percent of that time spent in face to face counseling regarding patient's symptoms,  and coordination of care and follow up.  Disclaimer:This dictation was prepared with Dragon/digital dictation along with [employer]. Any transcriptional errors that result from this process are unintentional.  Drue Second, NP 01/01/2016

## 2016-01-01 NOTE — Assessment & Plan Note (Signed)
Patient continues taking Xeloda oral therapy as directed.  He states that he is on cycle 7, day 11 of the Xeloda today.  He plans on taking a total of 8 cycles of Xeloda.  Patient is scheduled to return for labs and a follow-up visit on 01/06/2016.

## 2016-01-06 ENCOUNTER — Telehealth: Payer: Self-pay | Admitting: Oncology

## 2016-01-06 ENCOUNTER — Other Ambulatory Visit: Payer: Self-pay | Admitting: *Deleted

## 2016-01-06 ENCOUNTER — Other Ambulatory Visit (HOSPITAL_BASED_OUTPATIENT_CLINIC_OR_DEPARTMENT_OTHER): Payer: Medicare Other

## 2016-01-06 ENCOUNTER — Ambulatory Visit (HOSPITAL_BASED_OUTPATIENT_CLINIC_OR_DEPARTMENT_OTHER): Payer: Medicare Other | Admitting: Oncology

## 2016-01-06 VITALS — BP 150/62 | HR 81 | Temp 98.2°F | Resp 18 | Ht 72.0 in | Wt 175.1 lb

## 2016-01-06 DIAGNOSIS — C182 Malignant neoplasm of ascending colon: Secondary | ICD-10-CM

## 2016-01-06 DIAGNOSIS — L739 Follicular disorder, unspecified: Secondary | ICD-10-CM

## 2016-01-06 DIAGNOSIS — D509 Iron deficiency anemia, unspecified: Secondary | ICD-10-CM | POA: Diagnosis not present

## 2016-01-06 LAB — CBC WITH DIFFERENTIAL/PLATELET
BASO%: 0.6 % (ref 0.0–2.0)
BASOS ABS: 0 10*3/uL (ref 0.0–0.1)
EOS ABS: 0.1 10*3/uL (ref 0.0–0.5)
EOS%: 1 % (ref 0.0–7.0)
HEMATOCRIT: 36 % — AB (ref 38.4–49.9)
HGB: 11.1 g/dL — ABNORMAL LOW (ref 13.0–17.1)
LYMPH#: 1.3 10*3/uL (ref 0.9–3.3)
LYMPH%: 22.3 % (ref 14.0–49.0)
MCH: 24.2 pg — AB (ref 27.2–33.4)
MCHC: 30.8 g/dL — ABNORMAL LOW (ref 32.0–36.0)
MCV: 78.6 fL — AB (ref 79.3–98.0)
MONO#: 0.7 10*3/uL (ref 0.1–0.9)
MONO%: 11.3 % (ref 0.0–14.0)
NEUT%: 64.8 % (ref 39.0–75.0)
NEUTROS ABS: 3.7 10*3/uL (ref 1.5–6.5)
Platelets: 282 10*3/uL (ref 140–400)
RBC: 4.58 10*6/uL (ref 4.20–5.82)
RDW: 24.6 % — AB (ref 11.0–14.6)
WBC: 5.8 10*3/uL (ref 4.0–10.3)

## 2016-01-06 MED ORDER — CAPECITABINE 500 MG PO TABS: ORAL_TABLET | ORAL | Status: DC

## 2016-01-06 MED ORDER — DOXYCYCLINE HYCLATE 100 MG PO TABS: 100.0000 mg | ORAL_TABLET | Freq: Two times a day (BID) | ORAL | Status: DC

## 2016-01-06 MED FILL — DOXYCYCLINE HYCLATE 100 MG: 100 | 7 days supply | Qty: 14 | Fill #0

## 2016-01-06 MED FILL — XELODA 500 MG TABLET: 500 | 21 days supply | Qty: 42 | Fill #0

## 2016-01-06 NOTE — Progress Notes (Signed)
  [place] OFFICE PROGRESS NOTE   Diagnosis: Colon cancer  INTERVAL HISTORY:   Gabriel Erickson returns as scheduled. He began cycle 7 adjuvant Xeloda on 12/19/2015. He was seen in the symptomatic management clinic on 01/01/2016 with a a few pustular lesions over the legs. The rash has progressed. The rash is pruritic at night. No change in the mild erythema at the hands and feet. No mouth sores or diarrhea.  Objective:  Vital signs in last 24 hours:  Blood pressure 150/62, pulse 81, temperature 98.2 F (36.8 C), temperature source Oral, resp. rate 18, height 6' (1.829 m), weight 175 lb 1.6 oz (79.425 kg), SpO2 100 %.    HEENT: No thrush or ulcers Resp: Lungs clear bilaterally Cardio: Regular rate and rhythm GI: No hepatomegaly, nontender Vascular: No leg edema  Skin: Palms without erythema, mild erythema at the soles. No skin breakdown. Multiple 1-2 mm pustular lesions surrounding hair follicles over the lower legs bilaterally. A few similar lesions at the arms.    Lab Results:  Lab Results  Component Value Date   WBC 5.8 01/06/2016   HGB 11.1* 01/06/2016   HCT 36.0* 01/06/2016   MCV 78.6* 01/06/2016   PLT 282 01/06/2016   NEUTROABS 3.7 01/06/2016    Medications: I have reviewed the patient's current medications.  Assessment/Plan: 1. Adenocarcinoma of the ascending colon, stage IIIB (T3, N1b), status post a partial colectomy 06/24/2015  2 of 27 lymph nodes contained metastatic carcinoma, lymphovascular and perineural invasion present  Microsatellite stable with no loss of mismatch repair protein expression  Cycle 1 adjuvant Xeloda 07/29/2015  Cycle 2 adjuvant Xeloda 08/19/2015  Cycle 3 adjuvant Xeloda 09/09/2015  Cycle 4 adjuvant Xeloda 10/14/2015 (50% dose reduction due to hand-foot syndrome following cycle 3); cycle 4 placed on hold 10/22/2015 due to foot pain  Cycle 5 adjuvant Xeloda 11/07/2015 (further dose reduced to 1000 mg every morning and 500  mg every afternoon)  Cycle 6 adjuvant Xeloda 11/28/2015 (same dose of 1000 mg every morning and 500 mg every afternoon)  Cycle 7 adjuvant Xeloda 12/19/2015 (no dose change)  Cycle 8 adjuvant Xeloda 01/09/2016 ( no dose change)  2. Diabetes  3. Hemachromatosis heterozygote  4. Family history of colon, ovarian, and biliary tract cancers  5. Indeterminate lung lesions on the chest CT 05/15/2015  6. Anemia, due to iron deficiency-improved  7.  Folliculitis-course of doxycycline prescribed 01/06/2016   Disposition:  Gabriel Erickson will complete the final cycle of adjuvant Xeloda beginning 01/09/2016. He appears to have folliculitis. The rash is diffuse. I doubt this is related to Xeloda. He will complete a course of doxycycline. He will contact us if the rash does not improve.  Gabriel Erickson will return for an office visit in one month.  Betsy Coder, MD  01/06/2016  11:19 AM

## 2016-01-06 NOTE — Telephone Encounter (Signed)
per pof to sch pt appt-gave pt copy of avs °

## 2016-01-07 LAB — CEA: CEA: 3.8 ng/mL (ref 0.0–4.7)

## 2016-01-12 ENCOUNTER — Telehealth: Payer: Self-pay | Admitting: *Deleted

## 2016-01-12 NOTE — Telephone Encounter (Signed)
Patients wife Margaretha Sheffield called requesting CEA results.  Call placed back to patient with CEA results from 01/06/16.  Pt appreciative of call and has no questions or concerns at this time.

## 2016-01-26 LAB — HM DIABETES EYE EXAM

## 2016-02-05 ENCOUNTER — Ambulatory Visit (HOSPITAL_BASED_OUTPATIENT_CLINIC_OR_DEPARTMENT_OTHER): Payer: Medicare Other | Admitting: Nurse Practitioner

## 2016-02-05 ENCOUNTER — Other Ambulatory Visit (HOSPITAL_BASED_OUTPATIENT_CLINIC_OR_DEPARTMENT_OTHER): Payer: Medicare Other

## 2016-02-05 ENCOUNTER — Telehealth: Payer: Self-pay | Admitting: Nurse Practitioner

## 2016-02-05 VITALS — BP 155/83 | HR 78 | Temp 97.9°F | Resp 17 | Ht 72.0 in | Wt 175.5 lb

## 2016-02-05 DIAGNOSIS — C182 Malignant neoplasm of ascending colon: Secondary | ICD-10-CM

## 2016-02-05 DIAGNOSIS — D509 Iron deficiency anemia, unspecified: Secondary | ICD-10-CM

## 2016-02-05 LAB — CBC WITH DIFFERENTIAL/PLATELET
BASO%: 0.8 % (ref 0.0–2.0)
BASOS ABS: 0 10*3/uL (ref 0.0–0.1)
EOS ABS: 0.1 10*3/uL (ref 0.0–0.5)
EOS%: 1.4 % (ref 0.0–7.0)
HEMATOCRIT: 36.7 % — AB (ref 38.4–49.9)
HEMOGLOBIN: 11.4 g/dL — AB (ref 13.0–17.1)
LYMPH#: 1.6 10*3/uL (ref 0.9–3.3)
LYMPH%: 27.3 % (ref 14.0–49.0)
MCH: 24.5 pg — AB (ref 27.2–33.4)
MCHC: 31.1 g/dL — AB (ref 32.0–36.0)
MCV: 78.8 fL — AB (ref 79.3–98.0)
MONO#: 0.8 10*3/uL (ref 0.1–0.9)
MONO%: 12.9 % (ref 0.0–14.0)
NEUT#: 3.4 10*3/uL (ref 1.5–6.5)
NEUT%: 57.6 % (ref 39.0–75.0)
PLATELETS: 277 10*3/uL (ref 140–400)
RBC: 4.65 10*6/uL (ref 4.20–5.82)
RDW: 23.4 % — AB (ref 11.0–14.6)
WBC: 5.8 10*3/uL (ref 4.0–10.3)

## 2016-02-05 LAB — FERRITIN: Ferritin: 7 ng/ml — ABNORMAL LOW (ref 22–316)

## 2016-02-05 NOTE — Progress Notes (Signed)
  [facility] OFFICE PROGRESS NOTE   Diagnosis:  Colon cancer  INTERVAL HISTORY:   Gabriel Erickson completed the eighth and final cycle of adjuvant Xeloda beginning 01/09/2016. He had mild nausea. No vomiting. No mouth sores. No diarrhea. Rash he had at the time of his last visit has nearly resolved. No hand or foot pain. He notes the feet are red.  Objective:  Vital signs in last 24 hours:  Blood pressure 155/83, pulse 78, temperature 97.9 F (36.6 C), temperature source Oral, resp. rate 17, height 6' (1.829 m), weight 175 lb 8 oz (79.606 kg), SpO2 100 %.    HEENT: No thrush or ulcers. Lymphatics: No palpable cervical, supra clavicular, axillary or inguinal lymph nodes. Resp: Lungs clear bilaterally. Cardio: Regular rate and rhythm. GI: Abdomen soft and nontender. No organomegaly. No mass. Vascular: No leg edema. Skin: Palms are dry appearing. Soles are erythematous; no skin breakdown. Soft mobile approximate 1 cm lesion low left axillary line.   Lab Results:  Lab Results  Component Value Date   WBC 5.8 02/05/2016   HGB 11.4* 02/05/2016   HCT 36.7* 02/05/2016   MCV 78.8* 02/05/2016   PLT 277 02/05/2016   NEUTROABS 3.4 02/05/2016    Imaging:  No results found.  Medications: I have reviewed the patient's current medications.  Assessment/Plan: 1. Adenocarcinoma of the ascending colon, stage IIIB (T3, N1b), status post a partial colectomy 06/24/2015  2 of 27 lymph nodes contained metastatic carcinoma, lymphovascular and perineural invasion present  Microsatellite stable with no loss of mismatch repair protein expression  Cycle 1 adjuvant Xeloda 07/29/2015  Cycle 2 adjuvant Xeloda 08/19/2015  Cycle 3 adjuvant Xeloda 09/09/2015  Cycle 4 adjuvant Xeloda 10/14/2015 (50% dose reduction due to hand-foot syndrome following cycle 3); cycle 4 placed on hold 10/22/2015 due to foot pain  Cycle 5 adjuvant Xeloda 11/07/2015 (further dose reduced to 1000 mg every  morning and 500 mg every afternoon)  Cycle 6 adjuvant Xeloda 11/28/2015 (same dose of 1000 mg every morning and 500 mg every afternoon)  Cycle 7 adjuvant Xeloda 12/19/2015 (no dose change)  Cycle 8 adjuvant Xeloda 01/09/2016 ( no dose change)  2. Diabetes  3. Hemachromatosis heterozygote  4. Family history of colon, ovarian, and biliary tract cancers  5. Indeterminate lung lesions on the chest CT 05/15/2015  6. Anemia, due to iron deficiency-improved  7. Folliculitis-course of doxycycline prescribed 01/06/2016   Disposition:[name] appears stable. He has completed the planned course of adjuvant chemotherapy. We will schedule surveillance CT scans October of this year. We made a referral to Dr. Havery Moros for the one-year colonoscopy also October of this year. He will return for a follow-up visit one week after the scans to review the results. He will contact the office in the interim with any problems.    [name] ANP/GNP-BC   02/05/2016  10:56 AM

## 2016-02-05 NOTE — Telephone Encounter (Signed)
per pof to sch pt appt-gave pt copy of avs °

## 2016-02-09 ENCOUNTER — Other Ambulatory Visit: Payer: Self-pay | Admitting: *Deleted

## 2016-02-09 MED ORDER — GLUCOSE BLOOD VI STRP: ORAL_STRIP | 3 refills | Status: DC

## 2016-03-07 ENCOUNTER — Encounter: Payer: Self-pay | Admitting: Gastroenterology

## 2016-03-08 ENCOUNTER — Ambulatory Visit (INDEPENDENT_AMBULATORY_CARE_PROVIDER_SITE_OTHER): Payer: Medicare Other | Admitting: Internal Medicine

## 2016-03-08 ENCOUNTER — Other Ambulatory Visit (INDEPENDENT_AMBULATORY_CARE_PROVIDER_SITE_OTHER): Payer: Medicare Other

## 2016-03-08 ENCOUNTER — Encounter: Payer: Self-pay | Admitting: Internal Medicine

## 2016-03-08 VITALS — BP 136/74 | HR 87 | Ht 72.0 in | Wt 177.0 lb

## 2016-03-08 DIAGNOSIS — R202 Paresthesia of skin: Secondary | ICD-10-CM | POA: Diagnosis not present

## 2016-03-08 DIAGNOSIS — M545 Low back pain, unspecified: Secondary | ICD-10-CM

## 2016-03-08 DIAGNOSIS — M5440 Lumbago with sciatica, unspecified side: Secondary | ICD-10-CM | POA: Diagnosis not present

## 2016-03-08 DIAGNOSIS — G609 Hereditary and idiopathic neuropathy, unspecified: Secondary | ICD-10-CM

## 2016-03-08 DIAGNOSIS — Z0000 Encounter for general adult medical examination without abnormal findings: Secondary | ICD-10-CM

## 2016-03-08 DIAGNOSIS — E119 Type 2 diabetes mellitus without complications: Secondary | ICD-10-CM

## 2016-03-08 DIAGNOSIS — R945 Abnormal results of liver function studies: Secondary | ICD-10-CM

## 2016-03-08 DIAGNOSIS — C182 Malignant neoplasm of ascending colon: Secondary | ICD-10-CM | POA: Diagnosis not present

## 2016-03-08 DIAGNOSIS — E1142 Type 2 diabetes mellitus with diabetic polyneuropathy: Secondary | ICD-10-CM | POA: Diagnosis not present

## 2016-03-08 DIAGNOSIS — G8929 Other chronic pain: Secondary | ICD-10-CM

## 2016-03-08 DIAGNOSIS — K219 Gastro-esophageal reflux disease without esophagitis: Secondary | ICD-10-CM | POA: Diagnosis not present

## 2016-03-08 DIAGNOSIS — E559 Vitamin D deficiency, unspecified: Secondary | ICD-10-CM

## 2016-03-08 DIAGNOSIS — N320 Bladder-neck obstruction: Secondary | ICD-10-CM

## 2016-03-08 LAB — CBC WITH DIFFERENTIAL/PLATELET
BASOS PCT: 0.4 % (ref 0.0–3.0)
Basophils Absolute: 0 10*3/uL (ref 0.0–0.1)
EOS PCT: 0.9 % (ref 0.0–5.0)
Eosinophils Absolute: 0 10*3/uL (ref 0.0–0.7)
HCT: 37 % — ABNORMAL LOW (ref 39.0–52.0)
Hemoglobin: 12 g/dL — ABNORMAL LOW (ref 13.0–17.0)
LYMPHS ABS: 1.7 10*3/uL (ref 0.7–4.0)
Lymphocytes Relative: 31 % (ref 12.0–46.0)
MCHC: 32.4 g/dL (ref 30.0–36.0)
MCV: 77.9 fl — AB (ref 78.0–100.0)
MONO ABS: 0.8 10*3/uL (ref 0.1–1.0)
Monocytes Relative: 14.1 % — ABNORMAL HIGH (ref 3.0–12.0)
NEUTROS ABS: 2.9 10*3/uL (ref 1.4–7.7)
NEUTROS PCT: 53.6 % (ref 43.0–77.0)
Platelets: 290 10*3/uL (ref 150.0–400.0)
RBC: 4.74 Mil/uL (ref 4.22–5.81)
RDW: 19.1 % — AB (ref 11.5–15.5)
WBC: 5.4 10*3/uL (ref 4.0–10.5)

## 2016-03-08 LAB — BASIC METABOLIC PANEL
BUN: 11 mg/dL (ref 6–23)
CALCIUM: 9.1 mg/dL (ref 8.4–10.5)
CO2: 29 mEq/L (ref 19–32)
CREATININE: 0.85 mg/dL (ref 0.40–1.50)
Chloride: 104 mEq/L (ref 96–112)
GFR: 93.94 mL/min (ref >60.00)
Glucose, Bld: 136 mg/dL — ABNORMAL HIGH (ref 70–99)
Potassium: 4.3 mEq/L (ref 3.5–5.1)
SODIUM: 138 meq/L (ref 135–145)

## 2016-03-08 LAB — HEMOGLOBIN A1C: Hgb A1c MFr Bld: 7.2 % — ABNORMAL HIGH (ref 4.6–6.5)

## 2016-03-08 MED ORDER — METHADONE HCL 10 MG PO TABS: 20.0000 mg | ORAL_TABLET | Freq: Four times a day (QID) | ORAL | 0 refills | Status: DC | PRN

## 2016-03-08 NOTE — Assessment & Plan Note (Signed)
  On Methadone   Potential benefits of a long term opioids use as well as potential risks (i.e. addiction risk, apnea etc) and complications (i.e. Somnolence, constipation and others) were explained to the patient and were aknowledged.  Diazepam Benzo use risk is discussed

## 2016-03-08 NOTE — Assessment & Plan Note (Signed)
Chronic, related to hemochromatosis On Glimeperide Labs

## 2016-03-08 NOTE — Assessment & Plan Note (Signed)
Here for medicare wellness/physical  Diet: heart healthy  Physical activity: sedentary  Depression/mood screen: negative  Hearing: intact to whispered voice  Visual acuity: grossly normal, performs annual eye exam  ADLs: capable  Fall risk: increased due to ataxia/LE weakness Home safety: good  Cognitive evaluation: intact to orientation, naming, recall and repetition  EOL planning: adv directives, full code/ I agree  I have personally reviewed and have noted  1. The patient's medical and social history  2. Their use of alcohol, tobacco or illicit drugs  3. Their current medications and supplements  4. The patient's functional ability including ADL's, fall risks, home safety risks and hearing or visual impairment.  5. Diet and physical activities  6. Evidence for depression or mood disorders 7. Roster of doctors was reviewed    Today patient counseled on age appropriate routine health concerns for screening and prevention, each reviewed and up to date or declined. Immunizations reviewed and up to date or declined. Labs ordered and reviewed. Risk factors for depression reviewed and negative. Hearing function and visual acuity are intact. ADLs screened and addressed as needed. Functional ability and level of safety reviewed and appropriate. Education, counseling and referrals performed based on assessed risks today. Patient provided with a copy of personalized plan for preventive services.   Colonoscopy pending Declined a flu shot

## 2016-03-08 NOTE — Progress Notes (Signed)
Pre visit review using our clinic review tool, if applicable. No additional management support is needed unless otherwise documented below in the visit note. 

## 2016-03-08 NOTE — Assessment & Plan Note (Signed)
Labs

## 2016-03-08 NOTE — Progress Notes (Signed)
Subjective:  Patient ID: Gabriel Erickson, male    DOB: 1941/02/01  Age: 75 y.o. MRN: [id number]  CC: No chief complaint on file.   HPI Gabriel Erickson presents for LBP, colon ca, DM-2 and spasms. Pt gained wt. Well exam  Outpatient Medications Prior to Visit  Medication Sig Dispense Refill  . ACCU-CHEK SOFTCLIX LANCETS lancets by Other route 2 (two) times daily. Use as instructed     . b complex vitamins tablet Take 1 tablet by mouth daily.      . Cholecalciferol 1000 UNITS tablet Take 1,000 Units by mouth daily.      . diazepam (VALIUM) 5 MG tablet Take 1 tablet (5 mg total) by mouth 2 (two) times daily as needed for muscle spasms (spasms). For cramps or insomnia 60 tablet 3  . fluticasone (FLONASE) 50 MCG/ACT nasal spray Place 2 sprays into the nose daily. 16 g 11  . glimepiride (AMARYL) 4 MG tablet Take 1 tablet (4 mg total) by mouth 2 (two) times daily. 180 tablet 3  . glucose blood (ONE TOUCH ULTRA TEST) test strip Use to check blood sugars twice a day Dx E11.9 300 each 3  . loperamide (IMODIUM A-D) 2 MG tablet Take 2-4 mg by mouth 4 (four) times daily as needed for diarrhea or loose stools. Reported on 01/06/2016    . methadone (DOLOPHINE) 10 MG tablet Take 2 tablets (20 mg total) by mouth 4 (four) times daily as needed for severe pain. For pain - FILL ON OR AFTER 02/16/2016 240 tablet 0  . ondansetron (ZOFRAN) 4 MG tablet Take 1 tablet (4 mg total) by mouth every 6 (six) hours as needed for nausea. 20 tablet 6  . prochlorperazine (COMPAZINE) 10 MG tablet Take 1 tablet (10 mg total) by mouth every 6 (six) hours as needed for nausea or vomiting. 30 tablet 0  . pseudoephedrine-guaifenesin (MUCINEX D) 60-600 MG per tablet Take 1 tablet by mouth every 12 (twelve) hours as needed for congestion. Reported on 01/06/2016    . ranitidine (ZANTAC) 75 MG tablet Take 75 mg by mouth 2 (two) times daily.    Marland Kitchen doxycycline (VIBRA-TABS) 100 MG tablet Take 1 tablet (100 mg total) by mouth 2 (two) times daily.  (Patient not taking: Reported on 03/08/2016) 14 tablet 0   No facility-administered medications prior to visit.     ROS Review of Systems  Constitutional: Positive for fatigue and unexpected weight change. Negative for appetite change.  HENT: Negative for congestion, nosebleeds, sneezing, sore throat and trouble swallowing.   Eyes: Negative for itching and visual disturbance.  Respiratory: Negative for cough.   Cardiovascular: Negative for chest pain, palpitations and leg swelling.  Gastrointestinal: Negative for abdominal distention, blood in stool, diarrhea and nausea.  Genitourinary: Negative for frequency and hematuria.  Musculoskeletal: Positive for back pain and gait problem. Negative for joint swelling and neck pain.  Skin: Negative for rash.  Neurological: Negative for dizziness, tremors, speech difficulty and weakness.  Psychiatric/Behavioral: Negative for agitation, dysphoric mood and sleep disturbance. The patient is not nervous/anxious.     Objective:  BP 136/74   Pulse 87   Ht 6' (1.829 m)   Wt 177 lb (80.3 kg)   SpO2 98%   BMI 24.01 kg/m   BP Readings from Last 3 Encounters:  03/08/16 136/74  02/05/16 (!) 155/83  01/06/16 (!) 150/62    Wt Readings from Last 3 Encounters:  03/08/16 177 lb (80.3 kg)  02/05/16 175 lb 8 oz (  79.6 kg)  01/06/16 175 lb 1.6 oz (79.4 kg)    Physical Exam  Constitutional: He is oriented to person, place, and time. He appears well-developed. No distress.  NAD  HENT:  Mouth/Throat: Oropharynx is clear and moist.  Eyes: Conjunctivae are normal. Pupils are equal, round, and reactive to light.  Neck: Normal range of motion. No JVD present. No thyromegaly present.  Cardiovascular: Normal rate, regular rhythm, normal heart sounds and intact distal pulses.  Exam reveals no gallop and no friction rub.   No murmur heard. Pulmonary/Chest: Effort normal and breath sounds normal. No respiratory distress. He has no wheezes. He has no rales. He  exhibits no tenderness.  Abdominal: Soft. Bowel sounds are normal. He exhibits no distension and no mass. There is no tenderness. There is no rebound and no guarding.  Musculoskeletal: Normal range of motion. He exhibits tenderness. He exhibits no edema.  Lymphadenopathy:    He has no cervical adenopathy.  Neurological: He is alert and oriented to person, place, and time. He has normal reflexes. No cranial nerve deficit. He exhibits normal muscle tone. He displays a negative Romberg sign. Coordination abnormal. Gait normal.  Skin: Skin is warm and dry. No rash noted.  Psychiatric: He has a normal mood and affect. His behavior is normal. Judgment and thought content normal.  LS tender Weak LEs  Lab Results  Component Value Date   WBC 5.4 03/08/2016   HGB 12.0 (L) 03/08/2016   HCT 37.0 (L) 03/08/2016   PLT 290.0 03/08/2016   GLUCOSE 136 (H) 03/08/2016   CHOL 199 02/25/2014   TRIG 239 (H) 02/25/2014   HDL 43 02/25/2014   LDLDIRECT 142.3 04/12/2010   LDLCALC 108 (H) 02/25/2014   ALT 18 12/29/2015   AST 22 12/29/2015   NA 138 03/08/2016   K 4.3 03/08/2016   CL 104 03/08/2016   CREATININE 0.85 03/08/2016   BUN 11 03/08/2016   CO2 29 03/08/2016   TSH 0.39 06/03/2015   PSA 1.33 12/12/2014   HGBA1C 7.4 (H) 12/07/2015    US Soft Tissue Head/neck  Result Date: 08/17/2015 CLINICAL DATA:  75 year old male with a history of thyroid nodule identified on chest CT. EXAM: THYROID ULTRASOUND TECHNIQUE: Ultrasound examination of the thyroid gland and adjacent soft tissues was performed. COMPARISON:  CTA 05/15/2015 FINDINGS: Right thyroid lobe Measurements: 4.8 cm x 2.2 cm x 2.5 cm. Multiple small nodules measured of the right thyroid. Upper pole nodule:  7 mm x 4 mm x 6 mm. Mid thyroid: 1.4 cm x 1.2 cm x 1.1 cm. Solid features without internal calcifications. Lower right thyroid:  8 mm x 6 mm x 6 mm with cystic features. Left thyroid lobe Measurements: 4.8 cm x 2.5 cm x 2.0 cm. Small nodules of the  left thyroid measured. Upper pole: 1.1 cm x 6 mm x 8 mm. No internal calcifications. Well-defined border. Mid left thyroid:  9 mm x 6 mm x 8 mm. Lower pole: 1.1 cm x 6 mm x 9 mm. No internal calcifications, complex features, and without ill-defined border. Isthmus Thickness: 5 mm.  No nodules visualized. Lymphadenopathy None visualized. IMPRESSION: Multinodular thyroid. Dominant right-sided nodule does not meet criteria for biopsy. Findings do not meet current SRU consensus criteria for biopsy. Follow-up by clinical exam is recommended. In a patient of this age with no risk factors for thyroid carcinoma, lengthening the surveillance interval beyond 12 months is a reasonable strategy, as thyroid cancers <2cm are known to be indolent with nearly 100%  10-year survival. If the patient has known risk factors for thyroid carcinoma, a follow-up ultrasound in 12 months is recommended. - Managing incidental thyroid nodules detected on imaging: white paper of the ACR Incidental Thyroid Findings Committee. J Am Coll Radiol. 2015 Feb;12(2):143-50. - Management of Thyroid Nodules Detected at [place]: Society of Radiologists in [place]. Radiology 2005; [id number]. Signed, Dulcy Fanny. Earleen Newport, DO [facility] Electronically Signed   By: Corrie Mckusick D.O.   On: 08/17/2015 12:22    Assessment & Plan:   There are no diagnoses linked to this encounter. I have discontinued Mr. Vieyra doxycycline. I am also having him maintain his ACCU-CHEK SOFTCLIX LANCETS, Cholecalciferol, b complex vitamins, pseudoephedrine-guaifenesin, fluticasone, diazepam, loperamide, prochlorperazine, ondansetron, ranitidine, methadone, glimepiride, and glucose blood.  No orders of the defined types were placed in this encounter.    Follow-up: No Follow-up on file.  Walker Kehr, MD

## 2016-03-08 NOTE — Assessment & Plan Note (Signed)
Off chemo

## 2016-03-08 NOTE — Patient Instructions (Signed)

## 2016-03-08 NOTE — Assessment & Plan Note (Signed)
Chronic - on phlebotomies Keep Hct <45% 

## 2016-05-11 ENCOUNTER — Ambulatory Visit: Payer: Medicare Other | Admitting: *Deleted

## 2016-05-11 ENCOUNTER — Encounter: Payer: Self-pay | Admitting: Gastroenterology

## 2016-05-11 VITALS — Ht 68.5 in | Wt 178.6 lb

## 2016-05-11 DIAGNOSIS — Z85038 Personal history of other malignant neoplasm of large intestine: Secondary | ICD-10-CM

## 2016-05-11 MED ORDER — SUPREP BOWEL PREP KIT 17.5-3.13-1.6 GM/177ML PO SOLN: 1.0000 | Freq: Once | ORAL | 0 refills | Status: AC

## 2016-05-11 NOTE — Progress Notes (Signed)
Patient denies any allergies to egg or soy products. Patient denies complications with anesthesia/sedation.  Patient denies oxygen use at home and denies diet medications. Emmi instructions for colonoscopy explained but patient denied.     

## 2016-05-17 ENCOUNTER — Encounter (HOSPITAL_COMMUNITY): Payer: Self-pay

## 2016-05-17 ENCOUNTER — Ambulatory Visit (HOSPITAL_COMMUNITY)
Admission: RE | Admit: 2016-05-17 | Discharge: 2016-05-17 | Disposition: A | Discharge status: Home / Self Care | Payer: Medicare Other | Source: Ambulatory Visit | Attending: Nurse Practitioner | Admitting: Nurse Practitioner

## 2016-05-17 ENCOUNTER — Other Ambulatory Visit (HOSPITAL_BASED_OUTPATIENT_CLINIC_OR_DEPARTMENT_OTHER): Payer: Medicare Other

## 2016-05-17 DIAGNOSIS — C182 Malignant neoplasm of ascending colon: Secondary | ICD-10-CM | POA: Insufficient documentation

## 2016-05-17 DIAGNOSIS — R918 Other nonspecific abnormal finding of lung field: Secondary | ICD-10-CM | POA: Diagnosis not present

## 2016-05-17 LAB — CBC WITH DIFFERENTIAL/PLATELET
BASO%: 0.9 % (ref 0.0–2.0)
Basophils Absolute: 0 10*3/uL (ref 0.0–0.1)
EOS%: 1.2 % (ref 0.0–7.0)
Eosinophils Absolute: 0.1 10*3/uL (ref 0.0–0.5)
HEMATOCRIT: 43.6 % (ref 38.4–49.9)
HGB: 13.3 g/dL (ref 13.0–17.1)
LYMPH#: 1.9 10*3/uL (ref 0.9–3.3)
LYMPH%: 33.2 % (ref 14.0–49.0)
MCH: 23.9 pg — ABNORMAL LOW (ref 27.2–33.4)
MCHC: 30.4 g/dL — AB (ref 32.0–36.0)
MCV: 78.4 fL — ABNORMAL LOW (ref 79.3–98.0)
MONO#: 0.7 10*3/uL (ref 0.1–0.9)
MONO%: 12.7 % (ref 0.0–14.0)
NEUT#: 3 10*3/uL (ref 1.5–6.5)
NEUT%: 52 % (ref 39.0–75.0)
Platelets: 259 10*3/uL (ref 140–400)
RBC: 5.56 10*6/uL (ref 4.20–5.82)
RDW: 16.9 % — ABNORMAL HIGH (ref 11.0–14.6)
WBC: 5.7 10*3/uL (ref 4.0–10.3)

## 2016-05-17 LAB — COMPREHENSIVE METABOLIC PANEL
ALT: 21 U/L (ref 0–55)
AST: 27 U/L (ref 5–34)
Albumin: 4 g/dL (ref 3.5–5.0)
Alkaline Phosphatase: 110 U/L (ref 40–150)
Anion Gap: 11 mEq/L (ref 3–11)
BUN: 8.2 mg/dL (ref 7.0–26.0)
CHLORIDE: 103 meq/L (ref 98–109)
CO2: 26 meq/L (ref 22–29)
CREATININE: 0.9 mg/dL (ref 0.7–1.3)
Calcium: 9.9 mg/dL (ref 8.4–10.4)
EGFR: 85 mL/min/{1.73_m2} — ABNORMAL LOW (ref >90)
Glucose: 97 mg/dl (ref 70–140)
POTASSIUM: 4.5 meq/L (ref 3.5–5.1)
SODIUM: 141 meq/L (ref 136–145)
Total Bilirubin: 0.59 mg/dL (ref 0.20–1.20)
Total Protein: 8.8 g/dL — ABNORMAL HIGH (ref 6.4–8.3)

## 2016-05-17 LAB — CEA (IN HOUSE-CHCC): CEA (CHCC-In House): 1.56 ng/mL (ref 0.00–5.00)

## 2016-05-17 LAB — FERRITIN: Ferritin: 12 ng/ml — ABNORMAL LOW (ref 22–316)

## 2016-05-17 MED ORDER — IOPAMIDOL (ISOVUE-300) INJECTION 61%
100.0000 mL | Freq: Once | INTRAVENOUS | Status: AC | PRN
  Administered 2016-05-17: 100 mL via INTRAVENOUS

## 2016-05-18 ENCOUNTER — Telehealth: Payer: Self-pay | Admitting: *Deleted

## 2016-05-18 LAB — CEA: CEA: 5.5 ng/mL — ABNORMAL HIGH (ref 0.0–4.7)

## 2016-05-18 NOTE — Telephone Encounter (Signed)
-----   Message from Ladell Pier, MD sent at 05/17/2016  7:57 PM EDT ----- Please call patient, cts negative for recurrent colon cancer

## 2016-05-18 NOTE — Telephone Encounter (Signed)
Called pt with CT result: Negative for recurrent colon cancer, per Dr. Benay Spice. Pt voiced appreciation for call.

## 2016-05-20 ENCOUNTER — Telehealth: Payer: Self-pay | Admitting: *Deleted

## 2016-05-20 NOTE — Telephone Encounter (Signed)
Call from pt reporting he will have a $250 co-pay for colonoscopy if done this year. Insurance will cover @ 100% if done next year. Pt is asking if it is OK to wait until next year for colonoscopy.  Per Dr. Benay Spice: It is fine to wait until next year. Pt made aware, voiced appreciation for call. He will follow up on 11/6 as scheduled.

## 2016-05-23 ENCOUNTER — Ambulatory Visit (HOSPITAL_BASED_OUTPATIENT_CLINIC_OR_DEPARTMENT_OTHER): Payer: Medicare Other | Admitting: Oncology

## 2016-05-23 ENCOUNTER — Other Ambulatory Visit: Payer: Medicare Other

## 2016-05-23 ENCOUNTER — Telehealth: Payer: Self-pay | Admitting: Oncology

## 2016-05-23 VITALS — BP 160/71 | HR 76 | Temp 97.8°F | Resp 17 | Ht 68.5 in | Wt 177.5 lb

## 2016-05-23 DIAGNOSIS — C182 Malignant neoplasm of ascending colon: Secondary | ICD-10-CM

## 2016-05-23 DIAGNOSIS — D509 Iron deficiency anemia, unspecified: Secondary | ICD-10-CM | POA: Diagnosis not present

## 2016-05-23 DIAGNOSIS — Z85038 Personal history of other malignant neoplasm of large intestine: Secondary | ICD-10-CM | POA: Diagnosis not present

## 2016-05-23 NOTE — Telephone Encounter (Signed)
Gave patient avs report and appointments for February and May  °

## 2016-05-23 NOTE — Progress Notes (Signed)
  [place] OFFICE PROGRESS NOTE   Diagnosis: Colon cancer  INTERVAL HISTORY:   Mr. Conry returns as scheduled. Surveillance CT scans on 05/17/2016 revealed no evidence of recurrent colon cancer. He reports an improved energy level. He would like to delay the one-year colonoscopy until next year because his insurance will only day for colonoscopy and a "24 month "interval.  Objective:  Vital signs in last 24 hours:  Blood pressure (!) 160/71, pulse 76, temperature 97.8 F (36.6 C), temperature source Oral, resp. rate 17, height 5' 8.5" (1.74 m), weight 177 lb 8 oz (80.5 kg), SpO2 100 %.    HEENT: Neck without mass Lymphatics: No cervical, supraclavicular, axillary, or inguinal nodes Resp: Lungs clear bilaterally Cardio: Regular rate and rhythm GI: No hepatosplenomegaly, no mass, nontender Vascular: No leg edema   Lab Results:  Lab Results  Component Value Date   WBC 5.7 05/17/2016   HGB 13.3 05/17/2016   HCT 43.6 05/17/2016   MCV 78.4 (L) 05/17/2016   PLT 259 05/17/2016   NEUTROABS 3.0 05/17/2016  Ferritin-12  Lab Results  Component Value Date   CEA1 5.5 (H) 05/17/2016  CEA at [facility]-1.56    Medications: I have reviewed the patient's current medications.  Assessment/Plan: 1. Adenocarcinoma of the ascending colon, stage IIIB (T3, N1b), status post a partial colectomy 06/24/2015 ? 2 of 27 lymph nodes contained metastatic carcinoma, lymphovascular and perineural invasion present ? Microsatellite stable with no loss of mismatch repair protein expression ? Cycle 1 adjuvant Xeloda 07/29/2015 ? Cycle 2 adjuvant Xeloda 08/19/2015 ? Cycle 3 adjuvant Xeloda 09/09/2015 ? Cycle 4 adjuvant Xeloda 10/14/2015 (50% dose reduction due to hand-foot syndrome following cycle 3); cycle 4 placed on hold 10/22/2015 due to foot pain ? Cycle 5 adjuvant Xeloda 11/07/2015 (further dose reduced to 1000 mg every morning and 500 mg every afternoon) ? Cycle 6 adjuvant Xeloda  11/28/2015 (same dose of 1000 mg every morning and 500 mg every afternoon) ? Cycle 7 adjuvant Xeloda 12/19/2015 (no dose change) ? Cycle 8 adjuvant Xeloda 01/09/2016 ( no dose change) ? Surveillance CT scans 05/17/2016-no evidence of recurrent colon cancer  2. Diabetes  3. Hemachromatosis heterozygote  4. Family history of colon, ovarian, and biliary tract cancers  5. Indeterminate lung lesions on the chest CT 05/15/2015  6. Anemia, due to iron deficiency-improved, persistent iron deficiency  7. Folliculitis-course of doxycycline prescribed 01/06/2016     Disposition:  Mr. Heeter remains in clinical remission from colon cancer. The CEA was mildly elevated at [place] and normal in our lab. I suspect this is a benign finding. He will return for a CEA in 3 months and an office visit in 6 months. He plans to delay the surveillance colonoscopy until 2018 secondary to lack of insurance coverage this year.  The persistent iron deficiency is likely related to phlebotomy therapy.     Betsy Coder, MD  05/23/2016  2:05 PM

## 2016-05-25 ENCOUNTER — Encounter: Payer: Medicare Other | Admitting: Gastroenterology

## 2016-06-13 ENCOUNTER — Other Ambulatory Visit (INDEPENDENT_AMBULATORY_CARE_PROVIDER_SITE_OTHER): Payer: Medicare Other

## 2016-06-13 ENCOUNTER — Ambulatory Visit (INDEPENDENT_AMBULATORY_CARE_PROVIDER_SITE_OTHER): Payer: Medicare Other | Admitting: Internal Medicine

## 2016-06-13 ENCOUNTER — Encounter: Payer: Self-pay | Admitting: Internal Medicine

## 2016-06-13 VITALS — BP 150/82 | HR 79 | Temp 97.8°F | Resp 14 | Ht 72.0 in | Wt 179.8 lb

## 2016-06-13 DIAGNOSIS — C182 Malignant neoplasm of ascending colon: Secondary | ICD-10-CM

## 2016-06-13 DIAGNOSIS — R0989 Other specified symptoms and signs involving the circulatory and respiratory systems: Secondary | ICD-10-CM | POA: Insufficient documentation

## 2016-06-13 DIAGNOSIS — N320 Bladder-neck obstruction: Secondary | ICD-10-CM

## 2016-06-13 DIAGNOSIS — E1142 Type 2 diabetes mellitus with diabetic polyneuropathy: Secondary | ICD-10-CM

## 2016-06-13 DIAGNOSIS — R202 Paresthesia of skin: Secondary | ICD-10-CM

## 2016-06-13 DIAGNOSIS — F4320 Adjustment disorder, unspecified: Secondary | ICD-10-CM

## 2016-06-13 DIAGNOSIS — E559 Vitamin D deficiency, unspecified: Secondary | ICD-10-CM | POA: Diagnosis not present

## 2016-06-13 DIAGNOSIS — R030 Elevated blood-pressure reading, without diagnosis of hypertension: Secondary | ICD-10-CM | POA: Insufficient documentation

## 2016-06-13 DIAGNOSIS — G8929 Other chronic pain: Secondary | ICD-10-CM

## 2016-06-13 DIAGNOSIS — F4321 Adjustment disorder with depressed mood: Secondary | ICD-10-CM | POA: Insufficient documentation

## 2016-06-13 DIAGNOSIS — M5440 Lumbago with sciatica, unspecified side: Secondary | ICD-10-CM

## 2016-06-13 LAB — CBC WITH DIFFERENTIAL/PLATELET
BASOS ABS: 0 10*3/uL (ref 0.0–0.1)
Basophils Relative: 0.4 % (ref 0.0–3.0)
EOS PCT: 1.7 % (ref 0.0–5.0)
Eosinophils Absolute: 0.1 10*3/uL (ref 0.0–0.7)
HEMATOCRIT: 40 % (ref 39.0–52.0)
HEMOGLOBIN: 12.8 g/dL — AB (ref 13.0–17.0)
LYMPHS ABS: 1.7 10*3/uL (ref 0.7–4.0)
LYMPHS PCT: 32.7 % (ref 12.0–46.0)
MCHC: 31.9 g/dL (ref 30.0–36.0)
MCV: 77.5 fl — AB (ref 78.0–100.0)
MONOS PCT: 13.1 % — AB (ref 3.0–12.0)
Monocytes Absolute: 0.7 10*3/uL (ref 0.1–1.0)
Neutro Abs: 2.7 10*3/uL (ref 1.4–7.7)
Neutrophils Relative %: 52.1 % (ref 43.0–77.0)
Platelets: 270 10*3/uL (ref 150.0–400.0)
RBC: 5.16 Mil/uL (ref 4.22–5.81)
RDW: 18.3 % — ABNORMAL HIGH (ref 11.5–15.5)
WBC: 5.2 10*3/uL (ref 4.0–10.5)

## 2016-06-13 LAB — VITAMIN B12: Vitamin B-12: 657 pg/mL (ref 211–911)

## 2016-06-13 LAB — BASIC METABOLIC PANEL
BUN: 9 mg/dL (ref 6–23)
CALCIUM: 9.4 mg/dL (ref 8.4–10.5)
CHLORIDE: 102 meq/L (ref 96–112)
CO2: 33 meq/L — AB (ref 19–32)
CREATININE: 0.84 mg/dL (ref 0.40–1.50)
GFR: 95.16 mL/min (ref >60.00)
GLUCOSE: 169 mg/dL — AB (ref 70–99)
Potassium: 4.8 mEq/L (ref 3.5–5.1)
Sodium: 138 mEq/L (ref 135–145)

## 2016-06-13 LAB — PSA: PSA: 1.57 ng/mL (ref 0.10–4.00)

## 2016-06-13 LAB — VITAMIN D 25 HYDROXY (VIT D DEFICIENCY, FRACTURES): VITD: 42.01 ng/mL (ref 30.00–100.00)

## 2016-06-13 LAB — HEMOGLOBIN A1C: Hgb A1c MFr Bld: 8.1 % — ABNORMAL HIGH (ref 4.6–6.5)

## 2016-06-13 LAB — TSH: TSH: 0.47 u[IU]/mL (ref 0.35–4.50)

## 2016-06-13 MED ORDER — LOSARTAN POTASSIUM 100 MG PO TABS: 100.0000 mg | ORAL_TABLET | Freq: Every day | ORAL | 11 refills | Status: DC

## 2016-06-13 MED ORDER — METHADONE HCL 10 MG PO TABS: 20.0000 mg | ORAL_TABLET | Freq: Four times a day (QID) | ORAL | 0 refills | Status: DC | PRN

## 2016-06-13 NOTE — Assessment & Plan Note (Signed)
Mild - carotid doopler [place]

## 2016-06-13 NOTE — Assessment & Plan Note (Signed)
On Methadone - stable for 20 years  Potential benefits of a long term opioids use as well as potential risks (i.e. addiction risk, apnea etc) and complications (i.e. Somnolence, constipation and others) were explained to the patient and were aknowledged.

## 2016-06-13 NOTE — Progress Notes (Signed)
Subjective:  Patient ID: Gabriel Erickson, male    DOB: 02-27-1941  Age: 75 y.o. MRN: [id number]  CC: No chief complaint on file.   HPI Gabriel Erickson presents for HTN, LBP, DM f/u. Sister just passed from stroke.  Outpatient Medications Prior to Visit  Medication Sig Dispense Refill  . ACCU-CHEK SOFTCLIX LANCETS lancets by Other route 2 (two) times daily. Use as instructed     . b complex vitamins tablet Take 1 tablet by mouth daily.      . Cholecalciferol 1000 UNITS tablet Take 1,000 Units by mouth daily.      . diazepam (VALIUM) 5 MG tablet Take 1 tablet (5 mg total) by mouth 2 (two) times daily as needed for muscle spasms (spasms). For cramps or insomnia 60 tablet 3  . fluticasone (FLONASE) 50 MCG/ACT nasal spray Place 2 sprays into the nose daily. 16 g 11  . glimepiride (AMARYL) 4 MG tablet Take 1 tablet (4 mg total) by mouth 2 (two) times daily. 180 tablet 3  . glucose blood (ONE TOUCH ULTRA TEST) test strip Use to check blood sugars twice a day Dx E11.9 300 each 3  . methadone (DOLOPHINE) 10 MG tablet Take 2 tablets (20 mg total) by mouth 4 (four) times daily as needed for severe pain. For pain - FILL ON OR AFTER 05/27/2016 240 tablet 0  . ranitidine (ZANTAC) 75 MG tablet Take 75 mg by mouth 2 (two) times daily.     No facility-administered medications prior to visit.     ROS Review of Systems  Constitutional: Positive for fatigue. Negative for appetite change and unexpected weight change.  HENT: Negative for congestion, nosebleeds, sneezing, sore throat and trouble swallowing.   Eyes: Negative for itching and visual disturbance.  Respiratory: Negative for cough.   Cardiovascular: Negative for chest pain, palpitations and leg swelling.  Gastrointestinal: Negative for abdominal distention, blood in stool, diarrhea and nausea.  Genitourinary: Negative for frequency and hematuria.  Musculoskeletal: Negative for back pain, gait problem, joint swelling and neck pain.  Skin: Negative  for rash.  Neurological: Negative for dizziness, tremors, speech difficulty and weakness.  Psychiatric/Behavioral: Negative for agitation, dysphoric mood, sleep disturbance and suicidal ideas. The patient is nervous/anxious.     Objective:  BP (!) 190/98   Pulse 79   Temp 97.8 F (36.6 C) (Oral)   Resp 14   Ht 6' (1.829 m)   Wt 179 lb 12.8 oz (81.6 kg)   SpO2 98%   BMI 24.39 kg/m   BP Readings from Last 3 Encounters:  06/13/16 (!) 190/98  05/23/16 (!) 160/71  03/08/16 136/74    Wt Readings from Last 3 Encounters:  06/13/16 179 lb 12.8 oz (81.6 kg)  05/23/16 177 lb 8 oz (80.5 kg)  05/11/16 178 lb 9.6 oz (81 kg)    Physical Exam  Constitutional: He is oriented to person, place, and time. He appears well-developed. No distress.  NAD  HENT:  Mouth/Throat: Oropharynx is clear and moist.  Eyes: Conjunctivae are normal. Pupils are equal, round, and reactive to light.  Neck: Normal range of motion. No JVD present. No thyromegaly present.  Cardiovascular: Normal rate, regular rhythm, normal heart sounds and intact distal pulses.  Exam reveals no gallop and no friction rub.   No murmur heard. Pulmonary/Chest: Effort normal and breath sounds normal. No respiratory distress. He has no wheezes. He has no rales. He exhibits no tenderness.  Abdominal: Soft. Bowel sounds are normal. He exhibits no  distension and no mass. There is no tenderness. There is no rebound and no guarding.  Musculoskeletal: Normal range of motion. He exhibits tenderness. He exhibits no edema.  Lymphadenopathy:    He has no cervical adenopathy.  Neurological: He is alert and oriented to person, place, and time. He has normal reflexes. No cranial nerve deficit. He exhibits normal muscle tone. He displays a negative Romberg sign. Coordination and gait normal.  Skin: Skin is warm and dry. No rash noted.  Psychiatric: He has a normal mood and affect. His behavior is normal. Judgment and thought content normal.  mild  bruit B  Lab Results  Component Value Date   WBC 5.2 06/13/2016   HGB 12.8 (L) 06/13/2016   HCT 40.0 06/13/2016   PLT 270.0 06/13/2016   GLUCOSE 169 (H) 06/13/2016   CHOL 199 02/25/2014   TRIG 239 (H) 02/25/2014   HDL 43 02/25/2014   LDLDIRECT 142.3 04/12/2010   LDLCALC 108 (H) 02/25/2014   ALT 21 05/17/2016   AST 27 05/17/2016   NA 138 06/13/2016   K 4.8 06/13/2016   CL 102 06/13/2016   CREATININE 0.84 06/13/2016   BUN 9 06/13/2016   CO2 33 (H) 06/13/2016   TSH 0.47 06/13/2016   PSA 1.57 06/13/2016   HGBA1C 8.1 (H) 06/13/2016    Ct Chest W Contrast  Result Date: 05/17/2016 CLINICAL DATA:  Stage III colon cancer, status post resection with adjuvant chemotherapy. EXAM: CT CHEST, ABDOMEN, AND PELVIS WITH CONTRAST TECHNIQUE: Multidetector CT imaging of the chest, abdomen and pelvis was performed following the standard protocol during bolus administration of intravenous contrast. CONTRAST:  131mL ISOVUE-300 IOPAMIDOL (ISOVUE-300) INJECTION 61% COMPARISON:  CT chest dated 05/15/2015. CT abdomen/ pelvis dated 03/26/2014. FINDINGS: CT CHEST FINDINGS Cardiovascular: Heart is normal in size.  No pericardial effusion. No evidence of thoracic aortic aneurysm. Mediastinum/Nodes: No suspicious mediastinal lymphadenopathy. Visualized thyroid is mildly nodular/heterogeneous, including 13 mm right thyroid nodule (series 2/image 11). Lungs/Pleura: 7 x 5 mm calcified granuloma in the subpleural right upper lobe (series 4/image 53), unchanged. 6 mm triangular subpleural lymph node in the right upper lobe (series 8/image 68). Additional 6 mm triangular subpleural lymph nodes along the minor fissure (series 4/image 79). 4 mm triangular subpleural lymph node in the lateral right middle lobe (series 4/image 88). No new/suspicious pulmonary nodules. No focal consolidation. No pleural effusion or pneumothorax. Musculoskeletal: Degenerative changes of the thoracic spine. CT ABDOMEN PELVIS FINDINGS Hepatobiliary:  Liver is within normal limits. No suspicious/enhancing hepatic lesions. Gallbladder is unremarkable. No intrahepatic or extrahepatic ductal dilatation. Pancreas: Within normal limits. Spleen: Within normal limits. Adrenals/Urinary Tract: Adrenal glands are within normal limits. 7 mm left left lower pole renal cyst (series 2/image 70). Right kidney is within normal limits. No hydronephrosis. Bladder is mildly thick-walled although underdistended. Stomach/Bowel: Stomach is within normal limits. Status post right hemicolectomy with appendectomy. No evidence of bowel obstruction. Left colonic diverticulosis, without evidence of diverticulitis. Vascular/Lymphatic: No evidence of abdominal aortic aneurysm. Small upper abdominal lymph nodes, including an 11 mm node in the porta hepatis (series 2/ image 60), previously 9 mm. Small right mid abdominal nodes measuring up to 6 mm short axis (series 2/ images 79 and 89). No suspicious abdominopelvic lymphadenopathy. Reproductive: Prostatomegaly. Other: No abdominopelvic ascites. Musculoskeletal: Postsurgical changes with ray cage fusion and prior laminectomy at L4-5 and L5-S1. IMPRESSION: Status post right hemicolectomy with appendectomy. Small abdominopelvic lymph nodes, as above, within the upper limits of normal. Attention on follow-up is suggested. 7 x  5 mm calcified granuloma in the right upper lobe, unchanged, benign. Additional stable pulmonary nodules, most of which are compatible with benign subpleural lymph nodes. No findings specific for metastatic disease. Electronically Signed   By: Julian Hy M.D.   On: 05/17/2016 16:00   Ct Abdomen Pelvis W Contrast  Result Date: 05/17/2016 CLINICAL DATA:  Stage III colon cancer, status post resection with adjuvant chemotherapy. EXAM: CT CHEST, ABDOMEN, AND PELVIS WITH CONTRAST TECHNIQUE: Multidetector CT imaging of the chest, abdomen and pelvis was performed following the standard protocol during bolus administration  of intravenous contrast. CONTRAST:  168mL ISOVUE-300 IOPAMIDOL (ISOVUE-300) INJECTION 61% COMPARISON:  CT chest dated 05/15/2015. CT abdomen/ pelvis dated 03/26/2014. FINDINGS: CT CHEST FINDINGS Cardiovascular: Heart is normal in size.  No pericardial effusion. No evidence of thoracic aortic aneurysm. Mediastinum/Nodes: No suspicious mediastinal lymphadenopathy. Visualized thyroid is mildly nodular/heterogeneous, including 13 mm right thyroid nodule (series 2/image 11). Lungs/Pleura: 7 x 5 mm calcified granuloma in the subpleural right upper lobe (series 4/image 53), unchanged. 6 mm triangular subpleural lymph node in the right upper lobe (series 8/image 68). Additional 6 mm triangular subpleural lymph nodes along the minor fissure (series 4/image 79). 4 mm triangular subpleural lymph node in the lateral right middle lobe (series 4/image 88). No new/suspicious pulmonary nodules. No focal consolidation. No pleural effusion or pneumothorax. Musculoskeletal: Degenerative changes of the thoracic spine. CT ABDOMEN PELVIS FINDINGS Hepatobiliary: Liver is within normal limits. No suspicious/enhancing hepatic lesions. Gallbladder is unremarkable. No intrahepatic or extrahepatic ductal dilatation. Pancreas: Within normal limits. Spleen: Within normal limits. Adrenals/Urinary Tract: Adrenal glands are within normal limits. 7 mm left left lower pole renal cyst (series 2/image 70). Right kidney is within normal limits. No hydronephrosis. Bladder is mildly thick-walled although underdistended. Stomach/Bowel: Stomach is within normal limits. Status post right hemicolectomy with appendectomy. No evidence of bowel obstruction. Left colonic diverticulosis, without evidence of diverticulitis. Vascular/Lymphatic: No evidence of abdominal aortic aneurysm. Small upper abdominal lymph nodes, including an 11 mm node in the porta hepatis (series 2/ image 60), previously 9 mm. Small right mid abdominal nodes measuring up to 6 mm short axis  (series 2/ images 79 and 89). No suspicious abdominopelvic lymphadenopathy. Reproductive: Prostatomegaly. Other: No abdominopelvic ascites. Musculoskeletal: Postsurgical changes with ray cage fusion and prior laminectomy at L4-5 and L5-S1. IMPRESSION: Status post right hemicolectomy with appendectomy. Small abdominopelvic lymph nodes, as above, within the upper limits of normal. Attention on follow-up is suggested. 7 x 5 mm calcified granuloma in the right upper lobe, unchanged, benign. Additional stable pulmonary nodules, most of which are compatible with benign subpleural lymph nodes. No findings specific for metastatic disease. Electronically Signed   By: Julian Hy M.D.   On: 05/17/2016 16:00    Assessment & Plan:   There are no diagnoses linked to this encounter. I am having Mr. Rosinski maintain his ACCU-CHEK SOFTCLIX LANCETS, Cholecalciferol, b complex vitamins, fluticasone, diazepam, ranitidine, glimepiride, glucose blood, and methadone.  No orders of the defined types were placed in this encounter.    Follow-up: No Follow-up on file.  Walker Kehr, MD

## 2016-06-13 NOTE — Assessment & Plan Note (Signed)
No recent phlebotomies

## 2016-06-13 NOTE — Assessment & Plan Note (Signed)
On Glimepiride  Labs

## 2016-06-13 NOTE — Assessment & Plan Note (Signed)
Losartan to start if BP>150

## 2016-06-13 NOTE — Progress Notes (Signed)
Pre visit review using our clinic review tool, if applicable. No additional management support is needed unless otherwise documented below in the visit note. 

## 2016-06-13 NOTE — Assessment & Plan Note (Signed)
Jun 18, 2023 sister died - discussed

## 2016-06-13 NOTE — Assessment & Plan Note (Signed)
CEA in Jan 2018

## 2016-07-08 NOTE — Addendum Note (Signed)
Addended by: Cresenciano Lick on: 07/08/2016 05:35 PM   Modules accepted: Orders

## 2016-07-26 ENCOUNTER — Encounter (HOSPITAL_COMMUNITY): Payer: Medicare Other

## 2016-08-18 ENCOUNTER — Encounter (HOSPITAL_COMMUNITY): Payer: Self-pay

## 2016-08-18 ENCOUNTER — Ambulatory Visit (HOSPITAL_COMMUNITY)
Admission: RE | Admit: 2016-08-18 | Payer: Medicare Other | Source: Ambulatory Visit | Attending: Internal Medicine | Admitting: Internal Medicine

## 2016-08-23 ENCOUNTER — Other Ambulatory Visit (HOSPITAL_BASED_OUTPATIENT_CLINIC_OR_DEPARTMENT_OTHER): Payer: Medicare Other

## 2016-08-23 DIAGNOSIS — C182 Malignant neoplasm of ascending colon: Secondary | ICD-10-CM | POA: Diagnosis not present

## 2016-08-23 LAB — CBC WITH DIFFERENTIAL/PLATELET
BASO%: 0.2 % (ref 0.0–2.0)
BASOS ABS: 0 10*3/uL (ref 0.0–0.1)
EOS ABS: 0.1 10*3/uL (ref 0.0–0.5)
EOS%: 1.1 % (ref 0.0–7.0)
HEMATOCRIT: 43.8 % (ref 38.4–49.9)
HEMOGLOBIN: 13.9 g/dL (ref 13.0–17.1)
LYMPH#: 1.7 10*3/uL (ref 0.9–3.3)
LYMPH%: 30.7 % (ref 14.0–49.0)
MCH: 27.4 pg (ref 27.2–33.4)
MCHC: 31.7 g/dL — ABNORMAL LOW (ref 32.0–36.0)
MCV: 86.4 fL (ref 79.3–98.0)
MONO#: 0.6 10*3/uL (ref 0.1–0.9)
MONO%: 11.9 % (ref 0.0–14.0)
NEUT%: 56.1 % (ref 39.0–75.0)
NEUTROS ABS: 3 10*3/uL (ref 1.5–6.5)
Platelets: 222 10*3/uL (ref 140–400)
RBC: 5.07 10*6/uL (ref 4.20–5.82)
RDW: 16.2 % — AB (ref 11.0–14.6)
WBC: 5.4 10*3/uL (ref 4.0–10.3)

## 2016-08-23 LAB — CEA (IN HOUSE-CHCC): CEA (CHCC-IN HOUSE): 1.1 ng/mL (ref 0.00–5.00)

## 2016-08-23 LAB — FERRITIN: FERRITIN: 8 ng/mL — AB (ref 22–316)

## 2016-08-24 ENCOUNTER — Telehealth: Payer: Self-pay

## 2016-08-24 LAB — CEA: CEA1: 5 ng/mL — AB (ref 0.0–4.7)

## 2016-08-24 NOTE — Telephone Encounter (Signed)
-----   Message from Ladell Pier, MD sent at 08/23/2016  8:42 PM EST ----- Please call patient, cea is normal, iron remains low, hb is normal, f/u as scheduled

## 2016-08-24 NOTE — Telephone Encounter (Signed)
Called and informed pt of CEA, iron, and HGB results. informed pt to follow up as scheduled per MD. Pt verbalized understanding and denies any questions or concerns at this time.

## 2016-09-13 ENCOUNTER — Ambulatory Visit (HOSPITAL_COMMUNITY)
Admission: RE | Admit: 2016-09-13 | Discharge: 2016-09-13 | Disposition: A | Discharge status: Home / Self Care | Payer: Medicare Other | Source: Ambulatory Visit | Attending: Cardiovascular Disease | Admitting: Cardiovascular Disease

## 2016-09-13 ENCOUNTER — Encounter: Payer: Self-pay | Admitting: Internal Medicine

## 2016-09-13 ENCOUNTER — Other Ambulatory Visit (INDEPENDENT_AMBULATORY_CARE_PROVIDER_SITE_OTHER): Payer: Medicare Other

## 2016-09-13 ENCOUNTER — Ambulatory Visit (INDEPENDENT_AMBULATORY_CARE_PROVIDER_SITE_OTHER): Payer: Medicare Other | Admitting: Internal Medicine

## 2016-09-13 DIAGNOSIS — G8929 Other chronic pain: Secondary | ICD-10-CM

## 2016-09-13 DIAGNOSIS — E119 Type 2 diabetes mellitus without complications: Secondary | ICD-10-CM | POA: Diagnosis not present

## 2016-09-13 DIAGNOSIS — R0989 Other specified symptoms and signs involving the circulatory and respiratory systems: Secondary | ICD-10-CM | POA: Insufficient documentation

## 2016-09-13 DIAGNOSIS — E1142 Type 2 diabetes mellitus with diabetic polyneuropathy: Secondary | ICD-10-CM

## 2016-09-13 DIAGNOSIS — M5440 Lumbago with sciatica, unspecified side: Secondary | ICD-10-CM

## 2016-09-13 DIAGNOSIS — C182 Malignant neoplasm of ascending colon: Secondary | ICD-10-CM | POA: Diagnosis not present

## 2016-09-13 LAB — BASIC METABOLIC PANEL
BUN: 8 mg/dL (ref 6–23)
CALCIUM: 9.5 mg/dL (ref 8.4–10.5)
CHLORIDE: 102 meq/L (ref 96–112)
CO2: 33 meq/L — AB (ref 19–32)
CREATININE: 0.79 mg/dL (ref 0.40–1.50)
GFR: 102.07 mL/min (ref >60.00)
Glucose, Bld: 182 mg/dL — ABNORMAL HIGH (ref 70–99)
Potassium: 4.6 mEq/L (ref 3.5–5.1)
Sodium: 138 mEq/L (ref 135–145)

## 2016-09-13 LAB — CBC WITH DIFFERENTIAL/PLATELET
BASOS ABS: 0 10*3/uL (ref 0.0–0.1)
Basophils Relative: 0.6 % (ref 0.0–3.0)
EOS PCT: 1.5 % (ref 0.0–5.0)
Eosinophils Absolute: 0.1 10*3/uL (ref 0.0–0.7)
HEMATOCRIT: 42.8 % (ref 39.0–52.0)
Hemoglobin: 14.1 g/dL (ref 13.0–17.0)
LYMPHS ABS: 1.5 10*3/uL (ref 0.7–4.0)
LYMPHS PCT: 29.9 % (ref 12.0–46.0)
MCHC: 32.9 g/dL (ref 30.0–36.0)
MCV: 83.5 fl (ref 78.0–100.0)
MONOS PCT: 15 % — AB (ref 3.0–12.0)
Monocytes Absolute: 0.8 10*3/uL (ref 0.1–1.0)
NEUTROS ABS: 2.7 10*3/uL (ref 1.4–7.7)
NEUTROS PCT: 53 % (ref 43.0–77.0)
PLATELETS: 219 10*3/uL (ref 150.0–400.0)
RBC: 5.12 Mil/uL (ref 4.22–5.81)
RDW: 16.2 % — ABNORMAL HIGH (ref 11.5–15.5)
WBC: 5 10*3/uL (ref 4.0–10.5)

## 2016-09-13 LAB — HEPATIC FUNCTION PANEL
ALBUMIN: 3.9 g/dL (ref 3.5–5.2)
ALT: 22 U/L (ref 0–53)
AST: 21 U/L (ref 0–37)
Alkaline Phosphatase: 74 U/L (ref 39–117)
Bilirubin, Direct: 0.1 mg/dL (ref 0.0–0.3)
TOTAL PROTEIN: 7.3 g/dL (ref 6.0–8.3)
Total Bilirubin: 0.4 mg/dL (ref 0.2–1.2)

## 2016-09-13 LAB — HEMOGLOBIN A1C: Hgb A1c MFr Bld: 8.8 % — ABNORMAL HIGH (ref 4.6–6.5)

## 2016-09-13 MED ORDER — METHADONE HCL 10 MG PO TABS: 20.0000 mg | ORAL_TABLET | Freq: Four times a day (QID) | ORAL | 0 refills | Status: DC | PRN

## 2016-09-13 NOTE — Assessment & Plan Note (Signed)
[facility]

## 2016-09-13 NOTE — Assessment & Plan Note (Signed)
Recent CEA 5.0

## 2016-09-13 NOTE — Progress Notes (Signed)
Subjective:  Patient ID: Gabriel Erickson, male    DOB: Jan 15, 1941  Age: 76 y.o. MRN: [id number]  CC: Follow-up (DM type 2)   HPI Gabriel Erickson presents for colon ca, DM, LBP f/u  Outpatient Medications Prior to Visit  Medication Sig Dispense Refill  . ACCU-CHEK SOFTCLIX LANCETS lancets by Other route 2 (two) times daily. Use as instructed     . b complex vitamins tablet Take 1 tablet by mouth daily.      . Cholecalciferol 1000 UNITS tablet Take 1,000 Units by mouth daily.      . diazepam (VALIUM) 5 MG tablet Take 1 tablet (5 mg total) by mouth 2 (two) times daily as needed for muscle spasms (spasms). For cramps or insomnia 60 tablet 3  . fluticasone (FLONASE) 50 MCG/ACT nasal spray Place 2 sprays into the nose daily. 16 g 11  . glimepiride (AMARYL) 4 MG tablet Take 1 tablet (4 mg total) by mouth 2 (two) times daily. 180 tablet 3  . glucose blood (ONE TOUCH ULTRA TEST) test strip Use to check blood sugars twice a day Dx E11.9 300 each 3  . losartan (COZAAR) 100 MG tablet Take 1 tablet (100 mg total) by mouth daily. 30 tablet 11  . methadone (DOLOPHINE) 10 MG tablet Take 2 tablets (20 mg total) by mouth 4 (four) times daily as needed for severe pain. For pain - FILL ON OR AFTER 08/27/2016 240 tablet 0  . ranitidine (ZANTAC) 75 MG tablet Take 75 mg by mouth 2 (two) times daily.     No facility-administered medications prior to visit.     ROS Review of Systems  Constitutional: Positive for fatigue. Negative for appetite change and unexpected weight change.  HENT: Negative for congestion, nosebleeds, sneezing, sore throat and trouble swallowing.   Eyes: Negative for itching and visual disturbance.  Respiratory: Negative for cough.   Cardiovascular: Negative for chest pain, palpitations and leg swelling.  Gastrointestinal: Negative for abdominal distention, blood in stool, diarrhea and nausea.  Genitourinary: Negative for frequency and hematuria.  Musculoskeletal: Positive for back pain and  gait problem. Negative for joint swelling and neck pain.  Skin: Negative for rash.  Neurological: Negative for dizziness, tremors, speech difficulty and weakness.  Psychiatric/Behavioral: Negative for agitation, dysphoric mood and sleep disturbance. The patient is not nervous/anxious.     Objective:  BP (!) 170/80   Pulse 69   Temp 98 F (36.7 C) (Oral)   Resp 16   Ht 6' (1.829 m)   Wt 182 lb 12 oz (82.9 kg)   SpO2 99%   BMI 24.79 kg/m   BP Readings from Last 3 Encounters:  09/13/16 (!) 170/80  06/13/16 (!) 150/82  05/23/16 (!) 160/71    Wt Readings from Last 3 Encounters:  09/13/16 182 lb 12 oz (82.9 kg)  06/13/16 179 lb 12.8 oz (81.6 kg)  05/23/16 177 lb 8 oz (80.5 kg)    Physical Exam  Constitutional: He is oriented to person, place, and time. He appears well-developed. No distress.  NAD  HENT:  Mouth/Throat: Oropharynx is clear and moist.  Eyes: Conjunctivae are normal. Pupils are equal, round, and reactive to light.  Neck: Normal range of motion. No JVD present. No thyromegaly present.  Cardiovascular: Normal rate, regular rhythm, normal heart sounds and intact distal pulses.  Exam reveals no gallop and no friction rub.   No murmur heard. Pulmonary/Chest: Effort normal and breath sounds normal. No respiratory distress. He has no wheezes. He  has no rales. He exhibits no tenderness.  Abdominal: Soft. Bowel sounds are normal. He exhibits no distension and no mass. There is no tenderness. There is no rebound and no guarding.  Musculoskeletal: Normal range of motion. He exhibits tenderness. He exhibits no edema.  Lymphadenopathy:    He has no cervical adenopathy.  Neurological: He is alert and oriented to person, place, and time. He has normal reflexes. No cranial nerve deficit. He exhibits normal muscle tone. He displays a negative Romberg sign. Coordination and gait normal.  Skin: Skin is warm and dry. No rash noted.  Psychiatric: He has a normal mood and affect. His  behavior is normal. Judgment and thought content normal.  looks better  Lab Results  Component Value Date   WBC 5.4 08/23/2016   HGB 13.9 08/23/2016   HCT 43.8 08/23/2016   PLT 222 08/23/2016   GLUCOSE 169 (H) 06/13/2016   CHOL 199 02/25/2014   TRIG 239 (H) 02/25/2014   HDL 43 02/25/2014   LDLDIRECT 142.3 04/12/2010   LDLCALC 108 (H) 02/25/2014   ALT 21 05/17/2016   AST 27 05/17/2016   NA 138 06/13/2016   K 4.8 06/13/2016   CL 102 06/13/2016   CREATININE 0.84 06/13/2016   BUN 9 06/13/2016   CO2 33 (H) 06/13/2016   TSH 0.47 06/13/2016   PSA 1.57 06/13/2016   HGBA1C 8.1 (H) 06/13/2016    No results found.  Assessment & Plan:   There are no diagnoses linked to this encounter. I am having Mr. Merrihew maintain his ACCU-CHEK SOFTCLIX LANCETS, Cholecalciferol, b complex vitamins, fluticasone, diazepam, ranitidine, glimepiride, glucose blood, methadone, and losartan.  No orders of the defined types were placed in this encounter.    Follow-up: No Follow-up on file.  Walker Kehr, MD

## 2016-09-13 NOTE — Assessment & Plan Note (Signed)
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 addiction risk, apnea etc) and complications (i.e. Somnolence, constipation and others) were explained to the patient and were aknowledged.

## 2016-09-15 ENCOUNTER — Telehealth: Payer: Self-pay | Admitting: Internal Medicine

## 2016-09-15 NOTE — Telephone Encounter (Signed)
Noted OK Thx 

## 2016-09-15 NOTE — Telephone Encounter (Signed)
Patient called back in regard to being placed on Actos.  Patient states right now he wants to start a diet and exercise to see if he can get his blood sugar reading lowered by next visit before he starts a medication.

## 2016-09-27 ENCOUNTER — Telehealth: Payer: Self-pay

## 2016-09-27 NOTE — Telephone Encounter (Signed)
PA initiated

## 2016-09-28 NOTE — Telephone Encounter (Signed)
Rec'd fax from [facility] stating we received PA that was incomplete. Needing more information for approval. Completed fax w/additional PA questions faxed back to [facility]...Johny Chess

## 2016-09-29 NOTE — Telephone Encounter (Signed)
LMOM of medication approved via PA

## 2016-11-17 ENCOUNTER — Telehealth: Payer: Self-pay

## 2016-11-17 NOTE — Telephone Encounter (Signed)
Wife and pt called about 5/7 appt. They were unaware. Informed them this was the 6 month f/u from their November appt. They did not see the dates on AVS. Apologized for miscommunication. Pt is able to keep appt.

## 2016-11-21 ENCOUNTER — Ambulatory Visit (HOSPITAL_BASED_OUTPATIENT_CLINIC_OR_DEPARTMENT_OTHER): Payer: Medicare Other | Admitting: Nurse Practitioner

## 2016-11-21 ENCOUNTER — Telehealth: Payer: Self-pay | Admitting: Oncology

## 2016-11-21 ENCOUNTER — Other Ambulatory Visit (HOSPITAL_BASED_OUTPATIENT_CLINIC_OR_DEPARTMENT_OTHER): Payer: Medicare Other

## 2016-11-21 VITALS — BP 174/88 | HR 71 | Temp 98.0°F | Resp 18 | Ht 72.0 in | Wt 179.6 lb

## 2016-11-21 DIAGNOSIS — Z85038 Personal history of other malignant neoplasm of large intestine: Secondary | ICD-10-CM

## 2016-11-21 DIAGNOSIS — C182 Malignant neoplasm of ascending colon: Secondary | ICD-10-CM

## 2016-11-21 DIAGNOSIS — D509 Iron deficiency anemia, unspecified: Secondary | ICD-10-CM | POA: Diagnosis not present

## 2016-11-21 LAB — CEA (IN HOUSE-CHCC): CEA (CHCC-In House): 1.21 ng/mL (ref 0.00–5.00)

## 2016-11-21 NOTE — Telephone Encounter (Signed)
Patient was given 2 bottles of contrast with a copy of the instructions, per CT orders.

## 2016-11-21 NOTE — Telephone Encounter (Signed)
Appointments scheduled per 11/21/16 los. Patient was given a copy of the AVS report and appointment schedule per 11/21/16 los. °

## 2016-11-21 NOTE — Progress Notes (Signed)
  [facility] OFFICE PROGRESS NOTE   Diagnosis: Colon cancer   INTERVAL HISTORY:   Mr. Arcia returns as scheduled. He feels well. He has a good appetite. He is exercising. Bowels overall moving regularly. He has occasional constipation. No bloody or black stools. He denies abdominal pain.  Objective:  Vital signs in last 24 hours:  Blood pressure (!) 174/88, pulse 71, temperature 98 F (36.7 C), temperature source Oral, resp. rate 18, height 6' (1.829 m), weight 179 lb 9.6 oz (81.5 kg), SpO2 99 %.    HEENT: Neck without mass. Lymphatics: No palpable cervical, supra clavicular, axillary or inguinal lymph nodes. Resp: Lungs clear bilaterally. Cardio: Regular rate and rhythm. GI: Abdomen soft and nontender. No hepatomegaly. Vascular: No leg edema.   Lab Results:  Lab Results  Component Value Date   WBC 5.0 09/13/2016   HGB 14.1 09/13/2016   HCT 42.8 09/13/2016   MCV 83.5 09/13/2016   PLT 219.0 09/13/2016   NEUTROABS 2.7 09/13/2016    Imaging:  No results found.  Medications: I have reviewed the patient's current medications.  Assessment/Plan: 1. Adenocarcinoma of the ascending colon, stage IIIB (T3, N1b), status post a partial colectomy 06/24/2015 ? 2 of 27 lymph nodes contained metastatic carcinoma, lymphovascular and perineural invasion present ? Microsatellite stable with no loss of mismatch repair protein expression ? Cycle 1 adjuvant Xeloda 07/29/2015 ? Cycle 2 adjuvant Xeloda 08/19/2015 ? Cycle 3 adjuvant Xeloda 09/09/2015 ? Cycle 4 adjuvant Xeloda 10/14/2015 (50% dose reduction due to hand-foot syndrome following cycle 3); cycle 4 placed on hold 10/22/2015 due to foot pain ? Cycle 5 adjuvant Xeloda 11/07/2015 (further dose reduced to 1000 mg every morning and 500 mg every afternoon) ? Cycle 6 adjuvant Xeloda 11/28/2015 (same dose of 1000 mg every morning and 500 mg every afternoon) ? Cycle 7 adjuvant Xeloda 12/19/2015 (no dose change) ? Cycle 8  adjuvant Xeloda 01/09/2016 ( no dose change) ? Surveillance CT scans 05/17/2016-no evidence of recurrent colon cancer  2. Diabetes  3. Hemachromatosis heterozygote  4. Family history of colon, ovarian, and biliary tract cancers  5. Indeterminate lung lesions on the chest CT 05/15/2015  6. Anemia, due to iron deficiency-improved, persistent iron deficiency  7. Folliculitis-course of doxycycline prescribed 01/06/2016    Disposition: Mr. Filippi remains in clinical remission from colon cancer. We will follow-up on the CEA from today. He will return for a follow-up visit in 6 months with surveillance CT scans 2-3 days prior to that visit.  He plans to contact Dr. Doyne Keel office to schedule a colonoscopy in October of this year.    [name] ANP/GNP-BC   11/21/2016  11:58 AM

## 2016-11-22 ENCOUNTER — Telehealth: Payer: Self-pay | Admitting: *Deleted

## 2016-11-22 NOTE — Telephone Encounter (Signed)
Call placed to patient to inform him that cea is normal and to f/u as scheduled per order of L. Marcello Moores NP.  Patient concerned that his lab appt for November is 2 days prior to his appt with Dr. Benay Spice.  Patient instructed that lab and CT scan will need to be done prior to his appt with Dr. Benay Spice.  Patient appreciative of information and has no further questions at this time.

## 2016-11-22 NOTE — Telephone Encounter (Signed)
-----   Message from Owens Shark, NP sent at 11/22/2016  8:50 AM EDT ----- Please let him know the CEA is normal. Follow-up as scheduled.

## 2016-12-13 ENCOUNTER — Ambulatory Visit (INDEPENDENT_AMBULATORY_CARE_PROVIDER_SITE_OTHER): Payer: Medicare Other | Admitting: Internal Medicine

## 2016-12-13 ENCOUNTER — Other Ambulatory Visit (INDEPENDENT_AMBULATORY_CARE_PROVIDER_SITE_OTHER): Payer: Medicare Other

## 2016-12-13 ENCOUNTER — Encounter: Payer: Self-pay | Admitting: Internal Medicine

## 2016-12-13 DIAGNOSIS — E119 Type 2 diabetes mellitus without complications: Secondary | ICD-10-CM

## 2016-12-13 DIAGNOSIS — M5440 Lumbago with sciatica, unspecified side: Secondary | ICD-10-CM | POA: Diagnosis not present

## 2016-12-13 DIAGNOSIS — R945 Abnormal results of liver function studies: Secondary | ICD-10-CM

## 2016-12-13 DIAGNOSIS — E1142 Type 2 diabetes mellitus with diabetic polyneuropathy: Secondary | ICD-10-CM

## 2016-12-13 DIAGNOSIS — G8929 Other chronic pain: Secondary | ICD-10-CM | POA: Diagnosis not present

## 2016-12-13 DIAGNOSIS — R0989 Other specified symptoms and signs involving the circulatory and respiratory systems: Secondary | ICD-10-CM | POA: Diagnosis not present

## 2016-12-13 LAB — CBC WITH DIFFERENTIAL/PLATELET
Basophils Absolute: 0 10*3/uL (ref 0.0–0.1)
Basophils Relative: 0.6 % (ref 0.0–3.0)
EOS ABS: 0.1 10*3/uL (ref 0.0–0.7)
Eosinophils Relative: 2 % (ref 0.0–5.0)
HCT: 46.5 % (ref 39.0–52.0)
HEMOGLOBIN: 15.7 g/dL (ref 13.0–17.0)
LYMPHS ABS: 1.6 10*3/uL (ref 0.7–4.0)
Lymphocytes Relative: 30.6 % (ref 12.0–46.0)
MCHC: 33.8 g/dL (ref 30.0–36.0)
MCV: 91 fl (ref 78.0–100.0)
MONO ABS: 0.7 10*3/uL (ref 0.1–1.0)
Monocytes Relative: 13.8 % — ABNORMAL HIGH (ref 3.0–12.0)
NEUTROS PCT: 53 % (ref 43.0–77.0)
Neutro Abs: 2.7 10*3/uL (ref 1.4–7.7)
Platelets: 234 10*3/uL (ref 150.0–400.0)
RBC: 5.11 Mil/uL (ref 4.22–5.81)
RDW: 15.6 % — ABNORMAL HIGH (ref 11.5–15.5)
WBC: 5.2 10*3/uL (ref 4.0–10.5)

## 2016-12-13 LAB — BASIC METABOLIC PANEL
BUN: 10 mg/dL (ref 6–23)
CALCIUM: 9.7 mg/dL (ref 8.4–10.5)
CO2: 31 meq/L (ref 19–32)
Chloride: 104 mEq/L (ref 96–112)
Creatinine, Ser: 0.89 mg/dL (ref 0.40–1.50)
GFR: 88.89 mL/min (ref >60.00)
GLUCOSE: 131 mg/dL — AB (ref 70–99)
POTASSIUM: 4.9 meq/L (ref 3.5–5.1)
SODIUM: 138 meq/L (ref 135–145)

## 2016-12-13 LAB — HEMOGLOBIN A1C: Hgb A1c MFr Bld: 8.1 % — ABNORMAL HIGH (ref 4.6–6.5)

## 2016-12-13 LAB — HEPATIC FUNCTION PANEL
ALT: 19 U/L (ref 0–53)
AST: 20 U/L (ref 0–37)
Albumin: 4 g/dL (ref 3.5–5.2)
Alkaline Phosphatase: 65 U/L (ref 39–117)
BILIRUBIN DIRECT: 0.1 mg/dL (ref 0.0–0.3)
TOTAL PROTEIN: 7.3 g/dL (ref 6.0–8.3)
Total Bilirubin: 0.6 mg/dL (ref 0.2–1.2)

## 2016-12-13 MED ORDER — METHADONE HCL 10 MG PO TABS: 20.0000 mg | ORAL_TABLET | Freq: Four times a day (QID) | ORAL | 0 refills | Status: DC | PRN

## 2016-12-13 MED ORDER — DIAZEPAM 5 MG PO TABS
5.0000 mg | ORAL_TABLET | Freq: Two times a day (BID) | ORAL | 3 refills | Status: DC | PRN
Start: 2016-12-13 — End: 2017-12-20

## 2016-12-13 NOTE — Assessment & Plan Note (Signed)
Losartan 

## 2016-12-13 NOTE — Assessment & Plan Note (Signed)
LFTs were ok

## 2016-12-13 NOTE — Assessment & Plan Note (Signed)
Methadone   Potential benefits of a long term Methadone use as well as potential risks (i.e. addiction risk, apnea etc) and complications (i.e. Somnolence, constipation and others) were explained to the patient and were aknowledged.  Diazepam prn Benzo use risk is discussed

## 2016-12-13 NOTE — Progress Notes (Signed)
Subjective:  Patient ID: Gabriel Erickson, male    DOB: 03/07/1941  Age: 76 y.o. MRN: [id number]  CC: No chief complaint on file.   HPI Gabriel Erickson presents for DM, LBP, GERD f/u. Declined Shingrix shot. SBP 130 at home  Outpatient Medications Prior to Visit  Medication Sig Dispense Refill  . ACCU-CHEK SOFTCLIX LANCETS lancets by Other route 2 (two) times daily. Use as instructed     . b complex vitamins tablet Take 1 tablet by mouth daily.      . Cholecalciferol 1000 UNITS tablet Take 1,000 Units by mouth daily.      . diazepam (VALIUM) 5 MG tablet Take 1 tablet (5 mg total) by mouth 2 (two) times daily as needed for muscle spasms (spasms). For cramps or insomnia 60 tablet 3  . fluticasone (FLONASE) 50 MCG/ACT nasal spray Place 2 sprays into the nose daily. 16 g 11  . glimepiride (AMARYL) 4 MG tablet Take 1 tablet (4 mg total) by mouth 2 (two) times daily. 180 tablet 3  . glucose blood (ONE TOUCH ULTRA TEST) test strip Use to check blood sugars twice a day Dx E11.9 300 each 3  . losartan (COZAAR) 100 MG tablet Take 1 tablet (100 mg total) by mouth daily. 30 tablet 11  . methadone (DOLOPHINE) 10 MG tablet Take 2 tablets (20 mg total) by mouth 4 (four) times daily as needed for severe pain. For pain - FILL ON OR AFTER 11/24/2016 240 tablet 0  . ranitidine (ZANTAC) 75 MG tablet Take 75 mg by mouth 2 (two) times daily.     No facility-administered medications prior to visit.     ROS Review of Systems  Constitutional: Positive for fatigue. Negative for appetite change and unexpected weight change.  HENT: Negative for congestion, nosebleeds, sneezing, sore throat and trouble swallowing.   Eyes: Negative for itching and visual disturbance.  Respiratory: Negative for cough.   Cardiovascular: Negative for chest pain, palpitations and leg swelling.  Gastrointestinal: Negative for abdominal distention, blood in stool, diarrhea and nausea.  Genitourinary: Negative for frequency and hematuria.    Musculoskeletal: Positive for back pain. Negative for gait problem, joint swelling and neck pain.  Skin: Negative for rash.  Neurological: Negative for dizziness, tremors, speech difficulty and weakness.  Psychiatric/Behavioral: Negative for agitation, dysphoric mood and sleep disturbance. The patient is not nervous/anxious.     Objective:  BP (!) 158/82 (BP Location: Left Arm, Patient Position: Sitting, Cuff Size: Normal)   Pulse 70   Temp 98.5 F (36.9 C) (Oral)   Ht 6' (1.829 m)   Wt 178 lb (80.7 kg)   SpO2 100%   BMI 24.14 kg/m   BP Readings from Last 3 Encounters:  12/13/16 (!) 158/82  11/21/16 (!) 174/88  09/13/16 (!) 170/80    Wt Readings from Last 3 Encounters:  12/13/16 178 lb (80.7 kg)  11/21/16 179 lb 9.6 oz (81.5 kg)  09/13/16 182 lb 12 oz (82.9 kg)    Physical Exam  Constitutional: He is oriented to person, place, and time. He appears well-developed. No distress.  NAD  HENT:  Mouth/Throat: Oropharynx is clear and moist.  Eyes: Conjunctivae are normal. Pupils are equal, round, and reactive to light.  Neck: Normal range of motion. No JVD present. No thyromegaly present.  Cardiovascular: Normal rate, regular rhythm, normal heart sounds and intact distal pulses.  Exam reveals no gallop and no friction rub.   No murmur heard. Pulmonary/Chest: Effort normal and breath sounds  normal. No respiratory distress. He has no wheezes. He has no rales. He exhibits no tenderness.  Abdominal: Soft. Bowel sounds are normal. He exhibits no distension and no mass. There is no tenderness. There is no rebound and no guarding.  Musculoskeletal: Normal range of motion. He exhibits tenderness. He exhibits no edema.  Lymphadenopathy:    He has no cervical adenopathy.  Neurological: He is alert and oriented to person, place, and time. He has normal reflexes. No cranial nerve deficit. He exhibits normal muscle tone. He displays a negative Romberg sign. Coordination and gait normal.   Skin: Skin is warm and dry. No rash noted.  Psychiatric: He has a normal mood and affect. His behavior is normal. Judgment and thought content normal.  partial wax B  Lab Results  Component Value Date   WBC 5.2 12/13/2016   HGB 15.7 12/13/2016   HCT 46.5 12/13/2016   PLT 234.0 12/13/2016   GLUCOSE 131 (H) 12/13/2016   CHOL 199 02/25/2014   TRIG 239 (H) 02/25/2014   HDL 43 02/25/2014   LDLDIRECT 142.3 04/12/2010   LDLCALC 108 (H) 02/25/2014   ALT 19 12/13/2016   AST 20 12/13/2016   NA 138 12/13/2016   K 4.9 12/13/2016   CL 104 12/13/2016   CREATININE 0.89 12/13/2016   BUN 10 12/13/2016   CO2 31 12/13/2016   TSH 0.47 06/13/2016   PSA 1.57 06/13/2016   HGBA1C 8.1 (H) 12/13/2016    No results found.  Assessment & Plan:   There are no diagnoses linked to this encounter. I am having Mr. Buttram maintain his ACCU-CHEK SOFTCLIX LANCETS, Cholecalciferol, b complex vitamins, fluticasone, diazepam, ranitidine, glimepiride, glucose blood, losartan, and methadone.  No orders of the defined types were placed in this encounter.    Follow-up: No Follow-up on file.  Walker Kehr, MD

## 2016-12-13 NOTE — Assessment & Plan Note (Signed)
Better - pt declined another Rx addition Will re-check in 3 mo Glimepiride

## 2017-01-04 ENCOUNTER — Ambulatory Visit (INDEPENDENT_AMBULATORY_CARE_PROVIDER_SITE_OTHER): Payer: Medicare Other | Admitting: Family Medicine

## 2017-01-04 ENCOUNTER — Encounter: Payer: Self-pay | Admitting: Family Medicine

## 2017-01-04 VITALS — BP 169/91 | HR 75 | Temp 98.4°F | Ht 72.0 in | Wt 178.0 lb

## 2017-01-04 DIAGNOSIS — B356 Tinea cruris: Secondary | ICD-10-CM | POA: Diagnosis not present

## 2017-01-04 DIAGNOSIS — T63301A Toxic effect of unspecified spider venom, accidental (unintentional), initial encounter: Secondary | ICD-10-CM | POA: Diagnosis not present

## 2017-01-04 MED ORDER — KETOCONAZOLE 2 % EX CREA: 1.0000 "application " | TOPICAL_CREAM | Freq: Two times a day (BID) | CUTANEOUS | 0 refills | Status: DC | PRN

## 2017-01-04 NOTE — Patient Instructions (Signed)
WE NOW OFFER   Gabriel Erickson's FAST TRACK!!!  SAME DAY Appointments for ACUTE CARE  Such as: Sprains, Injuries, cuts, abrasions, rashes, muscle pain, joint pain, back pain Colds, flu, sore throats, headache, allergies, cough, fever  Ear pain, sinus and eye infections Abdominal pain, nausea, vomiting, diarrhea, upset stomach Animal/insect bites  3 Easy Ways to Schedule: Walk-In Scheduling Call in scheduling Mychart Sign-up: [email or web address]         

## 2017-01-04 NOTE — Progress Notes (Signed)
   Subjective:    Patient ID: MERVYN PFLAUM, male    DOB: March 23, 1941, 76 y.o.   MRN: [id number]  HPI Here for 2 things. First about 2 weeks ago he noticed a rash in the groin areas that itches and burns. Then 3 days ago he felt a bite of some sort on the left forearm but he never saw the bug. It swelled at first but then went back down. Now it feels okay. He has no other symptoms.    Review of Systems  Constitutional: Negative.   Respiratory: Negative.   Cardiovascular: Negative.   Skin: Positive for rash and wound.  Neurological: Negative.        Objective:   Physical Exam  Constitutional: He is oriented to person, place, and time. He appears well-developed and well-nourished.  Cardiovascular: Normal rate, regular rhythm, normal heart sounds and intact distal pulses.   Pulmonary/Chest: Effort normal and breath sounds normal. No respiratory distress. He has no wheezes. He has no rales.  Neurological: He is alert and oriented to person, place, and time.  Skin:  The left forearm has a small macular red area that is not warm or tender. Both groin areas have patches of macular red skin           Assessment & Plan:  He has what is probably a spider bite on the left forearm. This looks to be benign and no treatment is needed. He also has some tinea in the groin areas and he will treat with Ketoconazole cream. Alysia Penna, MD

## 2017-02-09 ENCOUNTER — Other Ambulatory Visit: Payer: Self-pay | Admitting: Internal Medicine

## 2017-03-13 ENCOUNTER — Encounter: Payer: Self-pay | Admitting: Internal Medicine

## 2017-03-13 ENCOUNTER — Ambulatory Visit (INDEPENDENT_AMBULATORY_CARE_PROVIDER_SITE_OTHER): Payer: Medicare Other | Admitting: Internal Medicine

## 2017-03-13 ENCOUNTER — Other Ambulatory Visit (INDEPENDENT_AMBULATORY_CARE_PROVIDER_SITE_OTHER): Payer: Medicare Other

## 2017-03-13 VITALS — BP 136/80 | HR 66 | Temp 97.7°F | Ht 72.0 in | Wt 177.0 lb

## 2017-03-13 DIAGNOSIS — G8929 Other chronic pain: Secondary | ICD-10-CM | POA: Diagnosis not present

## 2017-03-13 DIAGNOSIS — E1142 Type 2 diabetes mellitus with diabetic polyneuropathy: Secondary | ICD-10-CM

## 2017-03-13 DIAGNOSIS — C182 Malignant neoplasm of ascending colon: Secondary | ICD-10-CM

## 2017-03-13 DIAGNOSIS — Z0000 Encounter for general adult medical examination without abnormal findings: Secondary | ICD-10-CM | POA: Diagnosis not present

## 2017-03-13 DIAGNOSIS — M5440 Lumbago with sciatica, unspecified side: Secondary | ICD-10-CM | POA: Diagnosis not present

## 2017-03-13 DIAGNOSIS — E119 Type 2 diabetes mellitus without complications: Secondary | ICD-10-CM

## 2017-03-13 LAB — CBC WITH DIFFERENTIAL/PLATELET
Basophils Absolute: 0 10*3/uL (ref 0.0–0.1)
Basophils Relative: 0.9 % (ref 0.0–3.0)
EOS ABS: 0.1 10*3/uL (ref 0.0–0.7)
Eosinophils Relative: 1.9 % (ref 0.0–5.0)
HEMATOCRIT: 45 % (ref 39.0–52.0)
HEMOGLOBIN: 15.2 g/dL (ref 13.0–17.0)
LYMPHS PCT: 34 % (ref 12.0–46.0)
Lymphs Abs: 1.8 10*3/uL (ref 0.7–4.0)
MCHC: 33.7 g/dL (ref 30.0–36.0)
MCV: 96 fl (ref 78.0–100.0)
Monocytes Absolute: 0.7 10*3/uL (ref 0.1–1.0)
Monocytes Relative: 12.9 % — ABNORMAL HIGH (ref 3.0–12.0)
NEUTROS ABS: 2.6 10*3/uL (ref 1.4–7.7)
Neutrophils Relative %: 50.3 % (ref 43.0–77.0)
PLATELETS: 216 10*3/uL (ref 150.0–400.0)
RBC: 4.69 Mil/uL (ref 4.22–5.81)
RDW: 13.4 % (ref 11.5–15.5)
WBC: 5.2 10*3/uL (ref 4.0–10.5)

## 2017-03-13 LAB — HEMOGLOBIN A1C: Hgb A1c MFr Bld: 7.1 % — ABNORMAL HIGH (ref 4.6–6.5)

## 2017-03-13 LAB — BASIC METABOLIC PANEL
BUN: 10 mg/dL (ref 6–23)
CALCIUM: 9.4 mg/dL (ref 8.4–10.5)
CO2: 34 mEq/L — ABNORMAL HIGH (ref 19–32)
CREATININE: 0.78 mg/dL (ref 0.40–1.50)
Chloride: 103 mEq/L (ref 96–112)
GFR: 103.44 mL/min (ref >60.00)
GLUCOSE: 109 mg/dL — AB (ref 70–99)
Potassium: 4.4 mEq/L (ref 3.5–5.1)
Sodium: 138 mEq/L (ref 135–145)

## 2017-03-13 LAB — HEPATIC FUNCTION PANEL
ALT: 21 U/L (ref 0–53)
AST: 23 U/L (ref 0–37)
Albumin: 4 g/dL (ref 3.5–5.2)
Alkaline Phosphatase: 71 U/L (ref 39–117)
BILIRUBIN TOTAL: 0.6 mg/dL (ref 0.2–1.2)
Bilirubin, Direct: 0.2 mg/dL (ref 0.0–0.3)
Total Protein: 7.2 g/dL (ref 6.0–8.3)

## 2017-03-13 MED ORDER — GLIMEPIRIDE 4 MG PO TABS: 4.0000 mg | ORAL_TABLET | Freq: Two times a day (BID) | ORAL | 3 refills | Status: DC

## 2017-03-13 MED ORDER — METHADONE HCL 10 MG PO TABS: 20.0000 mg | ORAL_TABLET | Freq: Four times a day (QID) | ORAL | 0 refills | Status: DC | PRN

## 2017-03-13 NOTE — Progress Notes (Signed)
Subjective:  Patient ID: Gabriel Erickson, male    DOB: 1941/03/01  Age: 76 y.o. MRN: [id number]  CC: No chief complaint on file.   HPI Gabriel Erickson presents for a well exam   Outpatient Medications Prior to Visit  Medication Sig Dispense Refill  . ACCU-CHEK SOFTCLIX LANCETS lancets by Other route 2 (two) times daily. Use as instructed     . b complex vitamins tablet Take 1 tablet by mouth daily.      . Cholecalciferol 1000 UNITS tablet Take 1,000 Units by mouth daily.      . diazepam (VALIUM) 5 MG tablet Take 1 tablet (5 mg total) by mouth 2 (two) times daily as needed for muscle spasms (spasms). For cramps or insomnia 60 tablet 3  . fluticasone (FLONASE) 50 MCG/ACT nasal spray Place 2 sprays into the nose daily. 16 g 11  . glimepiride (AMARYL) 4 MG tablet TAKE 1 TABLET (4 MG TOTAL) BY MOUTH 2 (TWO) TIMES DAILY. 180 tablet 1  . glucose blood (ONE TOUCH ULTRA TEST) test strip Use to check blood sugars twice a day Dx E11.9 300 each 3  . ketoconazole (NIZORAL) 2 % cream Apply 1 application topically 2 (two) times daily as needed for irritation. 30 g 0  . losartan (COZAAR) 100 MG tablet Take 1 tablet (100 mg total) by mouth daily. 30 tablet 11  . methadone (DOLOPHINE) 10 MG tablet Take 2 tablets (20 mg total) by mouth 4 (four) times daily as needed for severe pain. For pain - FILL ON OR AFTER 02/24/2017 240 tablet 0  . ranitidine (ZANTAC) 75 MG tablet Take 75 mg by mouth 2 (two) times daily.     No facility-administered medications prior to visit.     ROS Review of Systems  Constitutional: Negative for appetite change, fatigue and unexpected weight change.  HENT: Negative for congestion, nosebleeds, sneezing, sore throat and trouble swallowing.   Eyes: Negative for itching and visual disturbance.  Respiratory: Negative for cough.   Cardiovascular: Negative for chest pain, palpitations and leg swelling.  Gastrointestinal: Negative for abdominal distention, blood in stool, diarrhea and  nausea.  Genitourinary: Negative for frequency and hematuria.  Musculoskeletal: Positive for back pain and gait problem. Negative for joint swelling and neck pain.  Skin: Negative for rash.  Neurological: Negative for dizziness, tremors, speech difficulty and weakness.  Psychiatric/Behavioral: Negative for agitation, dysphoric mood and sleep disturbance. The patient is not nervous/anxious.     Objective:  BP 136/80 (BP Location: Left Arm, Patient Position: Sitting, Cuff Size: Normal)   Pulse 66   Temp 97.7 F (36.5 C) (Oral)   Ht 6' (1.829 m)   Wt 177 lb (80.3 kg)   SpO2 99%   BMI 24.01 kg/m   BP Readings from Last 3 Encounters:  03/13/17 136/80  01/04/17 (!) 169/91  12/13/16 (!) 158/82    Wt Readings from Last 3 Encounters:  03/13/17 177 lb (80.3 kg)  01/04/17 178 lb (80.7 kg)  12/13/16 178 lb (80.7 kg)    Physical Exam  Constitutional: He is oriented to person, place, and time. He appears well-developed. No distress.  NAD  HENT:  Mouth/Throat: Oropharynx is clear and moist.  Eyes: Pupils are equal, round, and reactive to light. Conjunctivae are normal.  Neck: Normal range of motion. No JVD present. No thyromegaly present.  Cardiovascular: Normal rate, regular rhythm, normal heart sounds and intact distal pulses.  Exam reveals no gallop and no friction rub.   No murmur  heard. Pulmonary/Chest: Effort normal and breath sounds normal. No respiratory distress. He has no wheezes. He has no rales. He exhibits no tenderness.  Abdominal: Soft. Bowel sounds are normal. He exhibits no distension and no mass. There is no tenderness. There is no rebound and no guarding.  Musculoskeletal: Normal range of motion. He exhibits tenderness. He exhibits no edema.  Lymphadenopathy:    He has no cervical adenopathy.  Neurological: He is alert and oriented to person, place, and time. He has normal reflexes. No cranial nerve deficit. He exhibits normal muscle tone. He displays a negative  Romberg sign. Coordination abnormal. Gait normal.  Skin: Skin is warm and dry. No rash noted.  Psychiatric: He has a normal mood and affect. His behavior is normal. Judgment and thought content normal.    Lab Results  Component Value Date   WBC 5.2 03/13/2017   HGB 15.2 03/13/2017   HCT 45.0 03/13/2017   PLT 216.0 03/13/2017   GLUCOSE 109 (H) 03/13/2017   CHOL 199 02/25/2014   TRIG 239 (H) 02/25/2014   HDL 43 02/25/2014   LDLDIRECT 142.3 04/12/2010   LDLCALC 108 (H) 02/25/2014   ALT 21 03/13/2017   AST 23 03/13/2017   NA 138 03/13/2017   K 4.4 03/13/2017   CL 103 03/13/2017   CREATININE 0.78 03/13/2017   BUN 10 03/13/2017   CO2 34 (H) 03/13/2017   TSH 0.47 06/13/2016   PSA 1.57 06/13/2016   HGBA1C 7.1 (H) 03/13/2017    No results found.  Assessment & Plan:   There are no diagnoses linked to this encounter. I am having Gabriel Erickson maintain his ACCU-CHEK SOFTCLIX LANCETS, Cholecalciferol, b complex vitamins, fluticasone, ranitidine, glucose blood, losartan, diazepam, methadone, ketoconazole, and glimepiride.  No orders of the defined types were placed in this encounter.    Follow-up: No Follow-up on file.  Walker Kehr, MD

## 2017-03-13 NOTE — Assessment & Plan Note (Signed)
Fu w/colonoscopy

## 2017-03-13 NOTE — Assessment & Plan Note (Signed)
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 addiction risk, apnea etc) and complications (i.e. Somnolence, constipation and others) were explained to the patient and were aknowledged.  Diazepam Benzo use risk is discussed - taking very little 

## 2017-03-13 NOTE — Assessment & Plan Note (Signed)
Better  Cont w/Rx

## 2017-03-13 NOTE — Assessment & Plan Note (Signed)
Here for medicare wellness/physical  Diet: heart healthy  Physical activity: sedentary  Depression/mood screen: negative  Hearing: intact to whispered voice  Visual acuity: grossly normal w/glasses,  performs annual eye exam  ADLs: capable  Fall risk: increased due to ataxia/LE weakness Home safety: good  Cognitive evaluation: intact to orientation, naming, recall and repetition  EOL planning: adv directives, full code/ I agree  I have personally reviewed and have noted  1. The patient's medical and social history  2. Their use of alcohol, tobacco or illicit drugs  3. Their current medications and supplements  4. The patient's functional ability including ADL's, fall risks, home safety risks and hearing or visual impairment.  5. Diet and physical activities  6. Evidence for depression or mood disorders 7. Roster of doctors was reviewed    Today patient counseled on age appropriate routine health concerns for screening and prevention, each reviewed and up to date or declined. Immunizations reviewed and up to date or declined. Labs ordered and reviewed. Risk factors for depression reviewed and negative. Hearing function and visual acuity are intact. ADLs screened and addressed as needed. Functional ability and level of safety reviewed and appropriate. Education, counseling and referrals performed based on assessed risks today. Patient provided with a copy of personalized plan for preventive services.   Declined all shots

## 2017-03-14 ENCOUNTER — Ambulatory Visit: Payer: Medicare Other | Admitting: Internal Medicine

## 2017-03-16 ENCOUNTER — Other Ambulatory Visit: Payer: Self-pay | Admitting: Internal Medicine

## 2017-03-31 IMAGING — CT CT CHEST W/ CM
2 of 3 series · 15 of 36 positions shown, 18 images · IV contrast (Omnipaque 300)
Comparison: 03/27/2015; 12/02/2009

CLINICAL DATA: Newly diagnosed colon cancer, staging workup.

EXAM:
CT CHEST WITH CONTRAST
TECHNIQUE: Multidetector CT imaging of the chest was performed during
intravenous contrast administration.
CONTRAST:  80mL OMNIPAQUE IOHEXOL 300 MG/ML  SOLN

[Series 2: chest routine with · axial · 0.70mm/px · z∈[-271,-6]mm · 12 of 63 slices shown, 15 images]
[im 5/63  mediastinal]
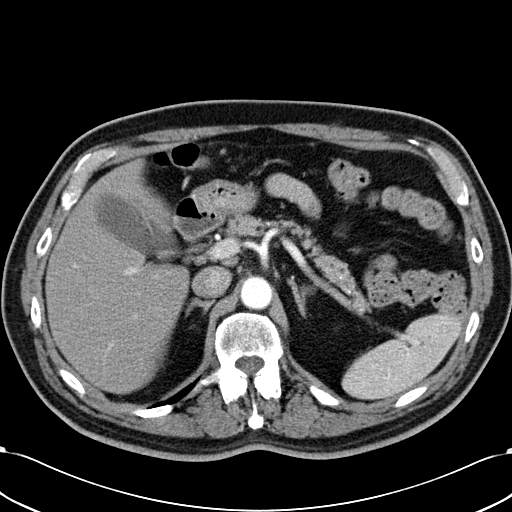
[im 5/63  lung]
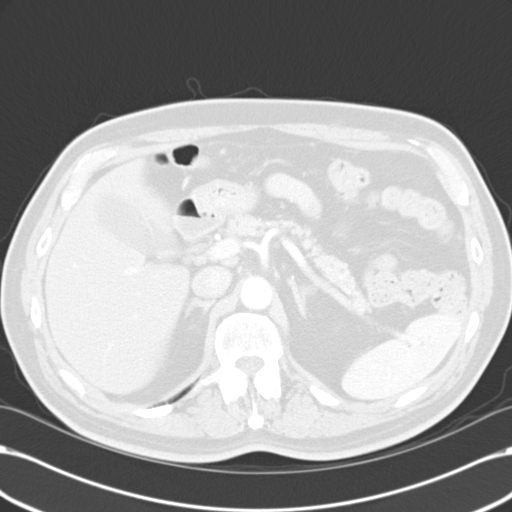
[im 10/63  lung]
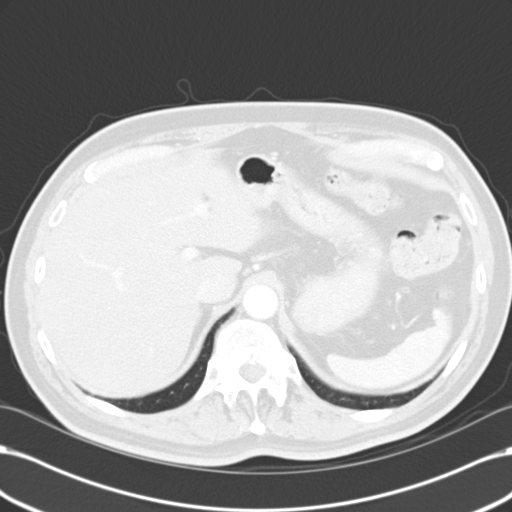
[im 14/63  lung]
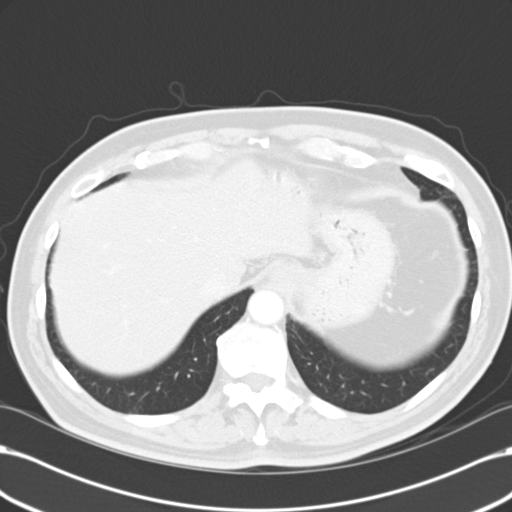
[im 19/63  lung]
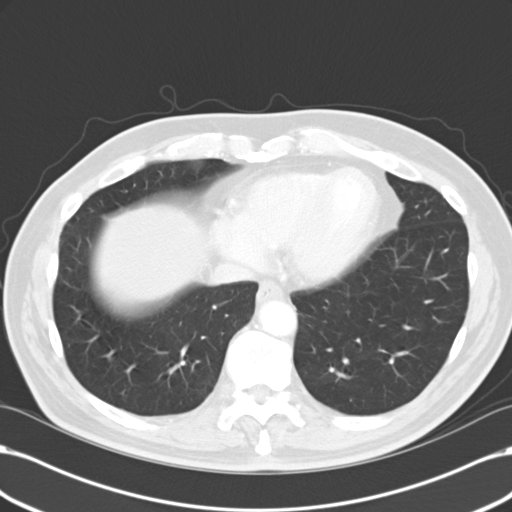
[im 23/63  mediastinal]
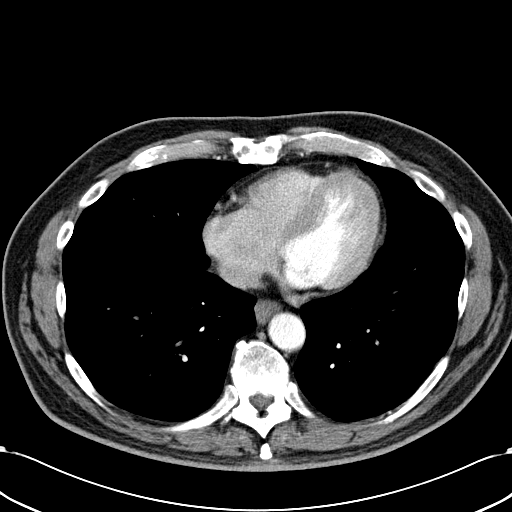
[im 23/63  lung]
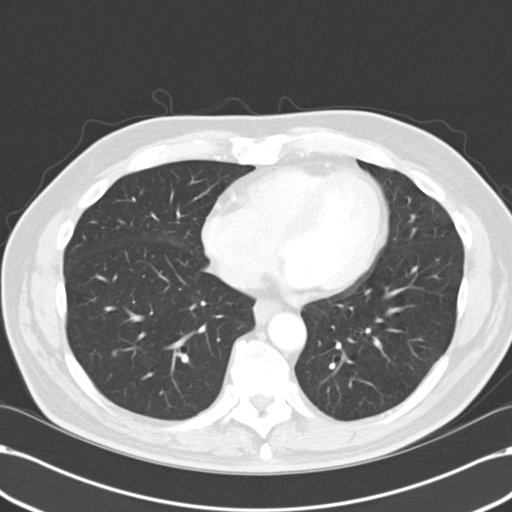
[im 28/63  lung]
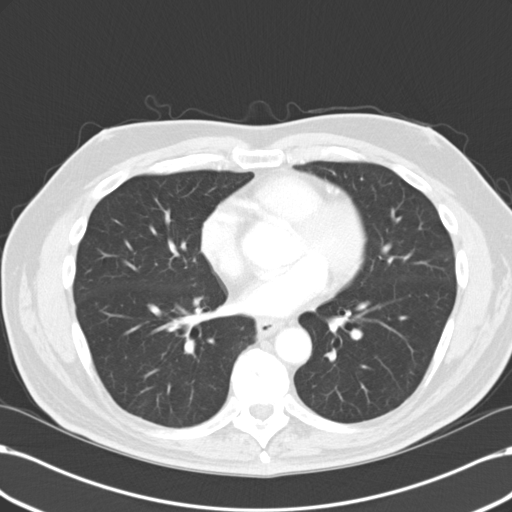
[im 35/63  lung]
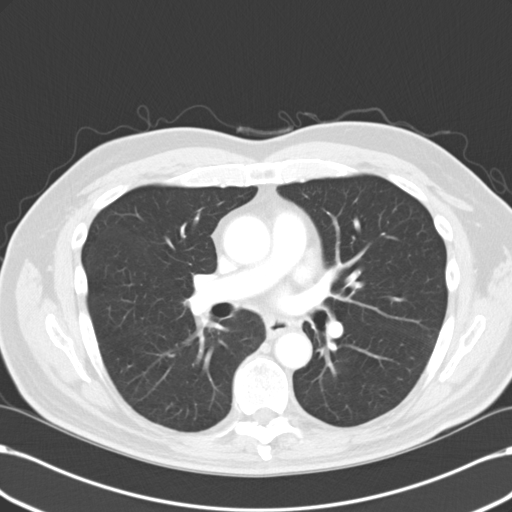
[im 40/63  lung]
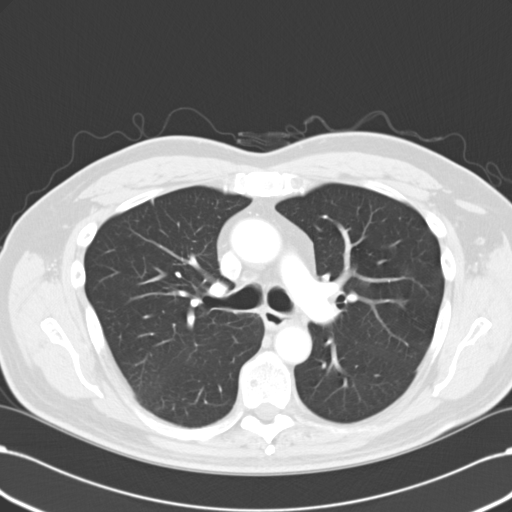
[im 44/63  mediastinal]
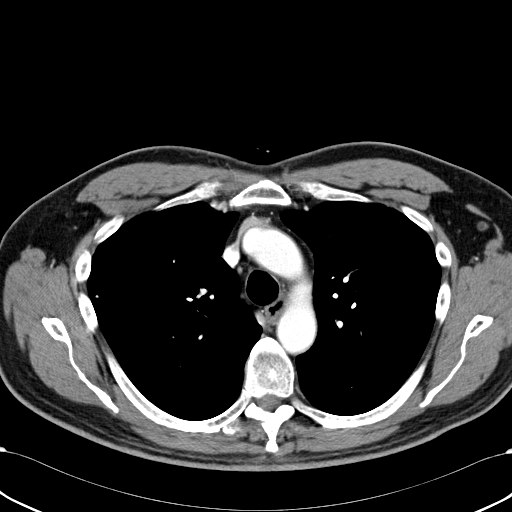
[im 44/63  lung]
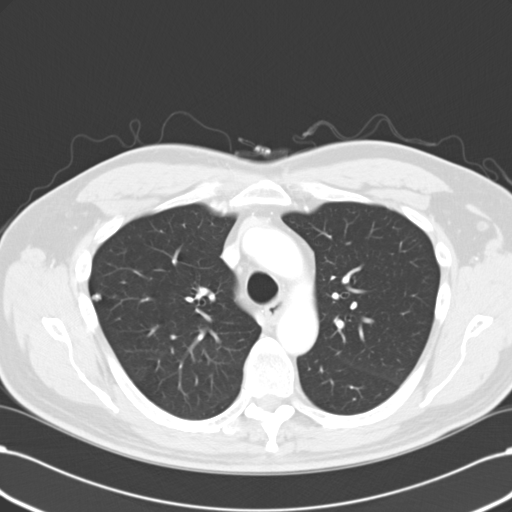
[im 49/63  lung]
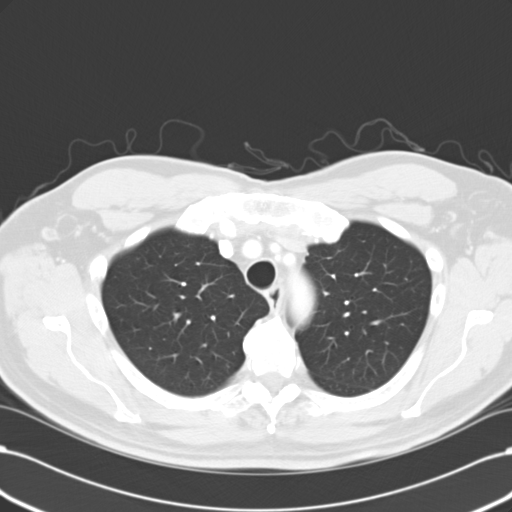
[im 53/63  lung]
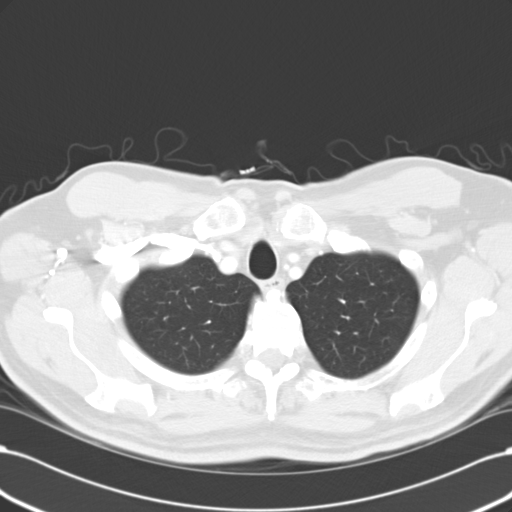
[im 58/63  lung]
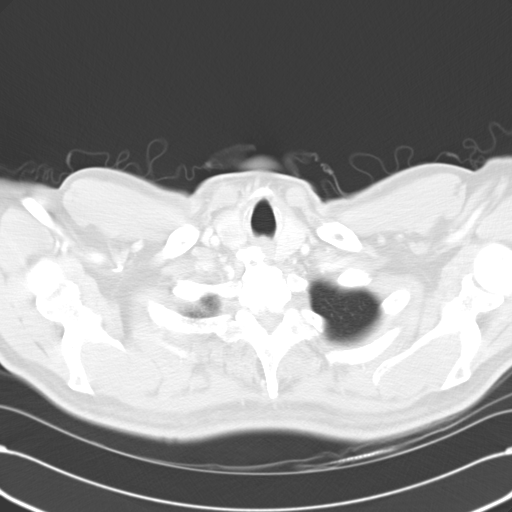

[Series 602: cor · coronal · 0.70mm/px · 3 of 114 slices shown]
[im 23/114  lung]
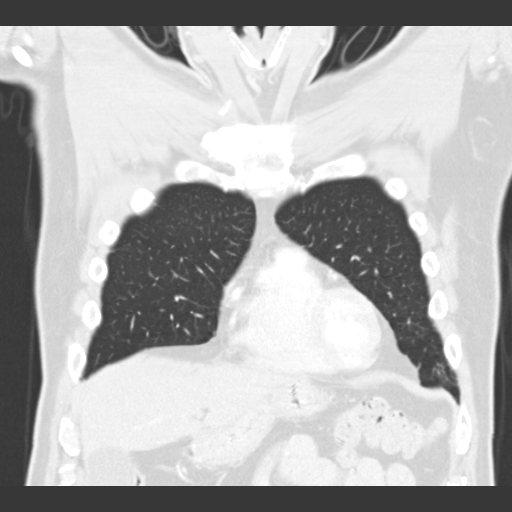
[im 46/114  lung]
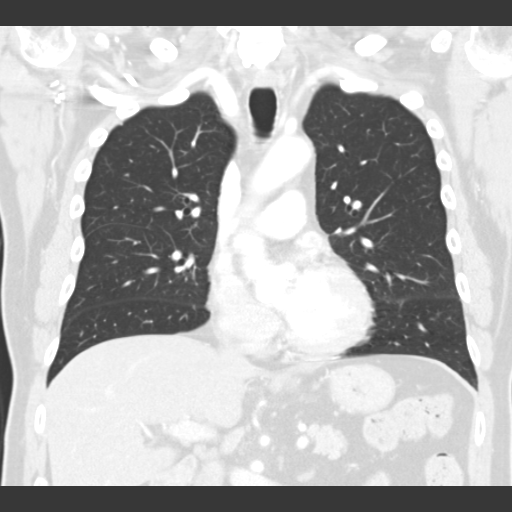
[im 68/114  lung]
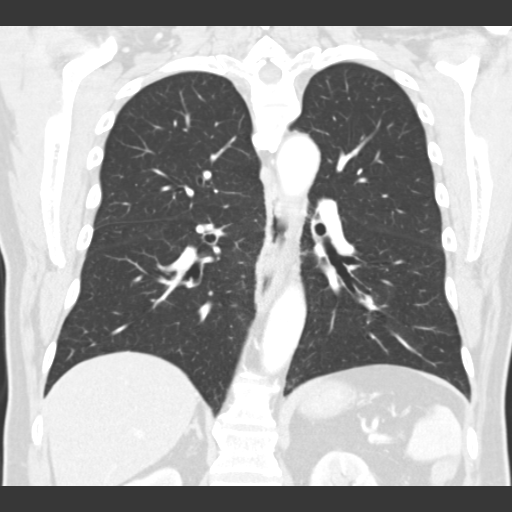

[15 of 36 positions shown; findings below may reference images not displayed]

FINDINGS: Mediastinum/Nodes: 1.6 cm hypodense right thyroid nodule, image 6
series 2.

No pathologic thoracic adenopathy.

Lungs/Pleura: 0.7 by 0.5 cm right upper lobe pulmonary nodule, image
20 series 3, small punctate calcification anteriorly within this
nodule. Triangular peripheral nodule in the right upper lobe
measures 4 mm in diameter on image 45 series 603. 3 mm subpleural
lymph node along the right middle lobe side of the minor fissure,
image 39 series 603. Peripheral 4 mm subpleural nodule laterally
along the right middle lobe, image 33 series 3.

Upper abdomen: Hepatic steatosis. Punctate calcifications in the
pancreatic parenchyma compatible with chronic pancreatitis.

Musculoskeletal: Lower cervical and thoracic spondylosis. 7 mm faint
sclerotic lesion in the T10 vertebral body eccentric to the right on
image 75 series 603, no change from 07/22/2008, considered benign.
Probable hemangioma measuring 6 mm in the T6 vertebral body
eccentric to the right. The
IMPRESSION: 1. A 7 by 5 mm nodule in the right upper lobe has punctate
calcification anteriorly, and although not entirely specific is
probably a granuloma. This nodule and the several additional smaller
nodules along the right upper lobe and right middle lobe, although
likely benign, warrant observation by chest CT in order to ensure
lack of progression.
2. Hepatic steatosis.
3. Chronic calcific pancreatitis.
4. 1.6 cm hypodense solid right thyroid nodule. Consider further
evaluation with thyroid ultrasound. If patient is clinically
hyperthyroid, consider nuclear medicine thyroid uptake and scan.

## 2017-05-24 ENCOUNTER — Other Ambulatory Visit (HOSPITAL_BASED_OUTPATIENT_CLINIC_OR_DEPARTMENT_OTHER): Payer: Medicare Other

## 2017-05-24 ENCOUNTER — Ambulatory Visit (HOSPITAL_COMMUNITY)
Admission: RE | Admit: 2017-05-24 | Discharge: 2017-05-24 | Disposition: A | Discharge status: Home / Self Care | Payer: Medicare Other | Source: Ambulatory Visit | Attending: Nurse Practitioner | Admitting: Nurse Practitioner

## 2017-05-24 ENCOUNTER — Encounter (HOSPITAL_COMMUNITY): Payer: Self-pay

## 2017-05-24 DIAGNOSIS — K5730 Diverticulosis of large intestine without perforation or abscess without bleeding: Secondary | ICD-10-CM | POA: Insufficient documentation

## 2017-05-24 DIAGNOSIS — Z9049 Acquired absence of other specified parts of digestive tract: Secondary | ICD-10-CM | POA: Insufficient documentation

## 2017-05-24 DIAGNOSIS — C182 Malignant neoplasm of ascending colon: Secondary | ICD-10-CM | POA: Insufficient documentation

## 2017-05-24 DIAGNOSIS — E1142 Type 2 diabetes mellitus with diabetic polyneuropathy: Secondary | ICD-10-CM

## 2017-05-24 DIAGNOSIS — G609 Hereditary and idiopathic neuropathy, unspecified: Secondary | ICD-10-CM

## 2017-05-24 DIAGNOSIS — Z0000 Encounter for general adult medical examination without abnormal findings: Secondary | ICD-10-CM

## 2017-05-24 DIAGNOSIS — M545 Low back pain, unspecified: Secondary | ICD-10-CM

## 2017-05-24 DIAGNOSIS — E119 Type 2 diabetes mellitus without complications: Secondary | ICD-10-CM

## 2017-05-24 DIAGNOSIS — K219 Gastro-esophageal reflux disease without esophagitis: Secondary | ICD-10-CM

## 2017-05-24 LAB — COMPREHENSIVE METABOLIC PANEL
ALT: 23 U/L (ref 0–55)
AST: 21 U/L (ref 5–34)
Albumin: 4.2 g/dL (ref 3.5–5.0)
Alkaline Phosphatase: 88 U/L (ref 40–150)
Anion Gap: 8 mEq/L (ref 3–11)
BUN: 9.8 mg/dL (ref 7.0–26.0)
CHLORIDE: 105 meq/L (ref 98–109)
CO2: 27 meq/L (ref 22–29)
Calcium: 9.7 mg/dL (ref 8.4–10.4)
Creatinine: 0.8 mg/dL (ref 0.7–1.3)
GLUCOSE: 122 mg/dL (ref 70–140)
POTASSIUM: 4.6 meq/L (ref 3.5–5.1)
SODIUM: 141 meq/L (ref 136–145)
TOTAL PROTEIN: 8 g/dL (ref 6.4–8.3)
Total Bilirubin: 0.47 mg/dL (ref 0.20–1.20)

## 2017-05-24 LAB — CBC WITH DIFFERENTIAL/PLATELET
BASO%: 0.9 % (ref 0.0–2.0)
BASOS ABS: 0.1 10*3/uL (ref 0.0–0.1)
EOS ABS: 0 10*3/uL (ref 0.0–0.5)
EOS%: 0.6 % (ref 0.0–7.0)
HEMATOCRIT: 47.7 % (ref 38.4–49.9)
HEMOGLOBIN: 16.1 g/dL (ref 13.0–17.1)
LYMPH#: 1.5 10*3/uL (ref 0.9–3.3)
LYMPH%: 23.2 % (ref 14.0–49.0)
MCH: 32.3 pg (ref 27.2–33.4)
MCHC: 33.7 g/dL (ref 32.0–36.0)
MCV: 95.7 fL (ref 79.3–98.0)
MONO#: 0.7 10*3/uL (ref 0.1–0.9)
MONO%: 10 % (ref 0.0–14.0)
NEUT#: 4.3 10*3/uL (ref 1.5–6.5)
NEUT%: 65.3 % (ref 39.0–75.0)
PLATELETS: 240 10*3/uL (ref 140–400)
RBC: 4.98 10*6/uL (ref 4.20–5.82)
RDW: 12.8 % (ref 11.0–14.6)
WBC: 6.5 10*3/uL (ref 4.0–10.3)

## 2017-05-24 LAB — CEA (IN HOUSE-CHCC): CEA (CHCC-In House): 1.73 ng/mL (ref 0.00–5.00)

## 2017-05-24 MED ORDER — IOPAMIDOL (ISOVUE-300) INJECTION 61%
100.0000 mL | Freq: Once | INTRAVENOUS | Status: AC | PRN
  Administered 2017-05-24: 100 mL via INTRAVENOUS

## 2017-05-24 MED ORDER — IOPAMIDOL (ISOVUE-300) INJECTION 61%
INTRAVENOUS | Status: AC
  Filled 2017-05-24: qty 100

## 2017-05-26 ENCOUNTER — Ambulatory Visit: Payer: Medicare Other | Admitting: Oncology

## 2017-05-26 ENCOUNTER — Telehealth: Payer: Self-pay | Admitting: Oncology

## 2017-05-26 ENCOUNTER — Encounter: Payer: Self-pay | Admitting: Gastroenterology

## 2017-05-26 VITALS — BP 180/69 | HR 74 | Temp 98.3°F | Resp 18 | Ht 72.0 in | Wt 177.5 lb

## 2017-05-26 DIAGNOSIS — Z85038 Personal history of other malignant neoplasm of large intestine: Secondary | ICD-10-CM | POA: Diagnosis not present

## 2017-05-26 DIAGNOSIS — C182 Malignant neoplasm of ascending colon: Secondary | ICD-10-CM

## 2017-05-26 NOTE — Telephone Encounter (Signed)
Scheduled appt per 11/9 los - Gave patient AVS and calender per los.  

## 2017-05-26 NOTE — Progress Notes (Signed)
[facility] OFFICE PROGRESS NOTE   Diagnosis: Colon cancer  INTERVAL HISTORY:   Gabriel Erickson returns as scheduled.  He feels well.  He has constipation that he relates to methadone.  He would like to discontinue methadone.  No other complaint.  Objective:  Vital signs in last 24 hours:  Blood pressure (!) 180/69, pulse 74, temperature 98.3 F (36.8 C), temperature source Oral, resp. rate 18, height 6' (1.829 m), weight 177 lb 8 oz (80.5 kg), SpO2 100 %.    HEENT: Neck without mass Lymphatics: No cervical, supraclavicular, axillary, or inguinal nodes.  Prominent left axillary fat pad. Resp: Lungs clear bilaterally Cardio: Regular rate and rhythm GI: No hepatomegaly, nontender, no mass Vascular: No leg edema   Lab Results:  Lab Results  Component Value Date   WBC 6.5 05/24/2017   HGB 16.1 05/24/2017   HCT 47.7 05/24/2017   MCV 95.7 05/24/2017   PLT 240 05/24/2017   NEUTROABS 4.3 05/24/2017    CMP     Component Value Date/Time   NA 141 05/24/2017 0945   K 4.6 05/24/2017 0945   CL 103 03/13/2017 1225   CO2 27 05/24/2017 0945   GLUCOSE 122 05/24/2017 0945   BUN 9.8 05/24/2017 0945   CREATININE 0.8 05/24/2017 0945   CALCIUM 9.7 05/24/2017 0945   PROT 8.0 05/24/2017 0945   ALBUMIN 4.2 05/24/2017 0945   AST 21 05/24/2017 0945   ALT 23 05/24/2017 0945   ALKPHOS 88 05/24/2017 0945   BILITOT 0.47 05/24/2017 0945   GFRNONAA >60 06/25/2015 0554   GFRAA >60 06/25/2015 0554    Lab Results  Component Value Date   CEA1 1.73 05/24/2017     Imaging:  Ct Chest W Contrast  Result Date: 05/24/2017 CLINICAL DATA:  Follow-up colon cancer, chemotherapy ongoing EXAM: CT CHEST, ABDOMEN, AND PELVIS WITH CONTRAST TECHNIQUE: Multidetector CT imaging of the chest, abdomen and pelvis was performed following the standard protocol during bolus administration of intravenous contrast. CONTRAST:  170m ISOVUE-300 IOPAMIDOL (ISOVUE-300) INJECTION 61% COMPARISON:  05/17/2016  FINDINGS: CT CHEST FINDINGS Cardiovascular: Heart is normal in size.  No pericardial effusion. No evidence of thoracic aortic aneurysm. Mediastinum/Nodes: No suspicious mediastinal, hilar, or axillary lymphadenopathy. Visualized thyroid is notable for a stable 13 mm right thyroid nodule. Lungs/Pleura: Stable 5 x 6 mm calcified granuloma in the right upper lobe (series 6/image 43). Two triangular subpleural lymph nodes in the right middle lobe measuring 4-5 mm (series 6/images 69 and 77). Additional 5 mm triangular subpleural lymph node in the right upper lobe (series 6/image 59). No focal consolidation. No pleural effusion or pneumothorax. Musculoskeletal: Degenerative changes of the thoracic spine. CT ABDOMEN PELVIS FINDINGS Hepatobiliary: Liver is within normal limits. Gallbladder is unremarkable. No intrahepatic or extrahepatic ductal dilatation. Pancreas: Parenchymal calcifications, suggesting sequela of prior/ chronic pancreatitis. Spleen: Within normal limits. Adrenals/Urinary Tract: Adrenal glands are within normal limits. 7 mm anterior left lower pole renal cyst (series 7/ image 24). Right kidney is within normal limits. No hydronephrosis. Bladder is within normal limits. Stomach/Bowel: Stomach is within normal limits. No evidence of bowel obstruction. Status post right hemicolectomy with appendectomy. Mild left colonic diverticulosis, without evidence of diverticulitis. Vascular/Lymphatic: No evidence of abdominal aortic aneurysm. No suspicious abdominopelvic lymphadenopathy. Small upper abdominal lymph nodes measuring 70 mm short axis, within normal limits. Reproductive: Prostatomegaly. Other: No abdominopelvic ascites. Musculoskeletal: Postsurgical changes with laminectomy and ray cage fusion at L4-5 and L5-S1. IMPRESSION: Status post right hemicolectomy with appendectomy. No findings suspicious  for recurrent or metastatic disease. Additional stable ancillary findings as above. Electronically Signed   By:  Julian Hy M.D.   On: 05/24/2017 14:30   Ct Abdomen Pelvis W Contrast  Result Date: 05/24/2017 CLINICAL DATA:  Follow-up colon cancer, chemotherapy ongoing EXAM: CT CHEST, ABDOMEN, AND PELVIS WITH CONTRAST TECHNIQUE: Multidetector CT imaging of the chest, abdomen and pelvis was performed following the standard protocol during bolus administration of intravenous contrast. CONTRAST:  133m ISOVUE-300 IOPAMIDOL (ISOVUE-300) INJECTION 61% COMPARISON:  05/17/2016 FINDINGS: CT CHEST FINDINGS Cardiovascular: Heart is normal in size.  No pericardial effusion. No evidence of thoracic aortic aneurysm. Mediastinum/Nodes: No suspicious mediastinal, hilar, or axillary lymphadenopathy. Visualized thyroid is notable for a stable 13 mm right thyroid nodule. Lungs/Pleura: Stable 5 x 6 mm calcified granuloma in the right upper lobe (series 6/image 43). Two triangular subpleural lymph nodes in the right middle lobe measuring 4-5 mm (series 6/images 69 and 77). Additional 5 mm triangular subpleural lymph node in the right upper lobe (series 6/image 59). No focal consolidation. No pleural effusion or pneumothorax. Musculoskeletal: Degenerative changes of the thoracic spine. CT ABDOMEN PELVIS FINDINGS Hepatobiliary: Liver is within normal limits. Gallbladder is unremarkable. No intrahepatic or extrahepatic ductal dilatation. Pancreas: Parenchymal calcifications, suggesting sequela of prior/ chronic pancreatitis. Spleen: Within normal limits. Adrenals/Urinary Tract: Adrenal glands are within normal limits. 7 mm anterior left lower pole renal cyst (series 7/ image 24). Right kidney is within normal limits. No hydronephrosis. Bladder is within normal limits. Stomach/Bowel: Stomach is within normal limits. No evidence of bowel obstruction. Status post right hemicolectomy with appendectomy. Mild left colonic diverticulosis, without evidence of diverticulitis. Vascular/Lymphatic: No evidence of abdominal aortic aneurysm. No  suspicious abdominopelvic lymphadenopathy. Small upper abdominal lymph nodes measuring 70 mm short axis, within normal limits. Reproductive: Prostatomegaly. Other: No abdominopelvic ascites. Musculoskeletal: Postsurgical changes with laminectomy and ray cage fusion at L4-5 and L5-S1. IMPRESSION: Status post right hemicolectomy with appendectomy. No findings suspicious for recurrent or metastatic disease. Additional stable ancillary findings as above. Electronically Signed   By: SJulian HyM.D.   On: 05/24/2017 14:30    Medications: I have reviewed the patient's current medications.  Assessment/Plan: 1. Adenocarcinoma of the ascending colon, stage IIIB (T3, N1b), status post a partial colectomy 06/24/2015 ? 2 of 27 lymph nodes contained metastatic carcinoma, lymphovascular and perineural invasion present ? Microsatellite stable with no loss of mismatch repair protein expression ? Cycle 1 adjuvant Xeloda 07/29/2015 ? Cycle 2 adjuvant Xeloda 08/19/2015 ? Cycle 3 adjuvant Xeloda 09/09/2015 ? Cycle 4 adjuvant Xeloda 10/14/2015 (50% dose reduction due to hand-foot syndrome following cycle 3); cycle 4 placed on hold 10/22/2015 due to foot pain ? Cycle 5 adjuvant Xeloda 11/07/2015 (further dose reduced to 1000 mg every morning and 500 mg every afternoon) ? Cycle 6 adjuvant Xeloda 11/28/2015 (same dose of 1000 mg every morning and 500 mg every afternoon) ? Cycle 7 adjuvant Xeloda 12/19/2015 (no dose change) ? Cycle 8 adjuvant Xeloda 01/09/2016 ( no dose change) ? Surveillance CT scans 05/17/2016-no evidence of recurrent colon cancer ? Surveillance CT scans 05/24/2017-no evidence of recurrent colon cancer  2. Diabetes  3. Hemachromatosis heterozygote  4. Family history of colon, ovarian, and biliary tract cancers  5. Indeterminate lung lesions on the chest CT 05/15/2015  6. Anemia, due to iron deficiency- resolved  7. Folliculitis-course of doxycycline prescribed  01/06/2016    Disposition:  Mr. WAdrianis in clinical remission from colon cancer.  He will return for an office visit and  CEA in 6 months.  We referred him for a surveillance colonoscopy.  He will schedule an appointment with Dr. Alain Marion to discuss weaning off of methadone.   Betsy Coder, MD  05/26/2017  10:39 AM

## 2017-06-19 ENCOUNTER — Encounter: Payer: Self-pay | Admitting: Internal Medicine

## 2017-06-19 ENCOUNTER — Ambulatory Visit: Payer: Medicare Other | Admitting: Internal Medicine

## 2017-06-19 ENCOUNTER — Other Ambulatory Visit (INDEPENDENT_AMBULATORY_CARE_PROVIDER_SITE_OTHER): Payer: Medicare Other

## 2017-06-19 ENCOUNTER — Other Ambulatory Visit: Payer: Self-pay

## 2017-06-19 DIAGNOSIS — C182 Malignant neoplasm of ascending colon: Secondary | ICD-10-CM | POA: Diagnosis not present

## 2017-06-19 DIAGNOSIS — G8929 Other chronic pain: Secondary | ICD-10-CM | POA: Diagnosis not present

## 2017-06-19 DIAGNOSIS — E1142 Type 2 diabetes mellitus with diabetic polyneuropathy: Secondary | ICD-10-CM

## 2017-06-19 DIAGNOSIS — M5440 Lumbago with sciatica, unspecified side: Secondary | ICD-10-CM

## 2017-06-19 DIAGNOSIS — Z0000 Encounter for general adult medical examination without abnormal findings: Secondary | ICD-10-CM

## 2017-06-19 LAB — URINALYSIS
Bilirubin Urine: NEGATIVE
Hgb urine dipstick: NEGATIVE
Ketones, ur: NEGATIVE
Leukocytes, UA: NEGATIVE
NITRITE: NEGATIVE
SPECIFIC GRAVITY, URINE: 1.01 (ref 1.000–1.030)
Total Protein, Urine: NEGATIVE
URINE GLUCOSE: NEGATIVE
Urobilinogen, UA: 0.2 (ref 0.0–1.0)
pH: 6.5 (ref 5.0–8.0)

## 2017-06-19 LAB — PSA: PSA: 3.22 ng/mL (ref 0.10–4.00)

## 2017-06-19 LAB — TSH: TSH: 0.42 u[IU]/mL (ref 0.35–4.50)

## 2017-06-19 MED ORDER — METHADONE HCL 10 MG PO TABS: 20.0000 mg | ORAL_TABLET | Freq: Four times a day (QID) | ORAL | 0 refills | Status: DC | PRN

## 2017-06-19 NOTE — Progress Notes (Signed)
Subjective:  Patient ID: Gabriel Erickson, male    DOB: [birth year]  Age: 76 y.o. MRN: [id number]  CC: No chief complaint on file.   HPI Gabriel Erickson presents for LBP, colon ca, DM f/u  Outpatient Medications Prior to Visit  Medication Sig Dispense Refill  . ACCU-CHEK SOFTCLIX LANCETS lancets by Other route 2 (two) times daily. Use as instructed     . b complex vitamins tablet Take 1 tablet by mouth daily.      . Cholecalciferol 1000 UNITS tablet Take 1,000 Units by mouth daily.      . diazepam (VALIUM) 5 MG tablet Take 1 tablet (5 mg total) by mouth 2 (two) times daily as needed for muscle spasms (spasms). For cramps or insomnia 60 tablet 3  . fluticasone (FLONASE) 50 MCG/ACT nasal spray Place 2 sprays into the nose daily. 16 g 11  . glimepiride (AMARYL) 4 MG tablet Take 1 tablet (4 mg total) by mouth 2 (two) times daily. 180 tablet 3  . ONE TOUCH ULTRA TEST test strip USE TO CHECK BLOOD SUGAR  TWO TIMES DAILY 300 each 3  . ranitidine (ZANTAC) 75 MG tablet Take 75 mg by mouth 2 (two) times daily.    . methadone (DOLOPHINE) 10 MG tablet Take 2 tablets (20 mg total) by mouth 4 (four) times daily as needed for severe pain. For pain - FILL ON OR AFTER 05/27/2017 240 tablet 0   No facility-administered medications prior to visit.     ROS Review of Systems  Constitutional: Negative for appetite change, fatigue and unexpected weight change.  HENT: Negative for congestion, nosebleeds, sneezing, sore throat and trouble swallowing.   Eyes: Negative for itching and visual disturbance.  Respiratory: Negative for cough.   Cardiovascular: Negative for chest pain, palpitations and leg swelling.  Gastrointestinal: Negative for abdominal distention, blood in stool, diarrhea and nausea.  Genitourinary: Negative for frequency and hematuria.  Musculoskeletal: Positive for back pain and gait problem. Negative for joint swelling and neck pain.  Skin: Negative for rash.  Neurological: Negative for dizziness,  tremors, speech difficulty and weakness.  Psychiatric/Behavioral: Negative for agitation, dysphoric mood and sleep disturbance. The patient is not nervous/anxious.     Objective:  BP (!) 162/80 (BP Location: Left Arm, Patient Position: Sitting, Cuff Size: Large)   Pulse 90   Temp 97.6 F (36.4 C) (Oral)   Ht 6' (1.829 m)   Wt 177 lb (80.3 kg)   SpO2 99%   BMI 24.01 kg/m   BP Readings from Last 3 Encounters:  06/19/17 (!) 162/80  05/26/17 (!) 180/69  03/13/17 136/80    Wt Readings from Last 3 Encounters:  06/19/17 177 lb (80.3 kg)  05/26/17 177 lb 8 oz (80.5 kg)  03/13/17 177 lb (80.3 kg)    Physical Exam  Constitutional: He is oriented to person, place, and time. He appears well-developed. No distress.  NAD  HENT:  Mouth/Throat: Oropharynx is clear and moist.  Eyes: Conjunctivae are normal. Pupils are equal, round, and reactive to light.  Neck: Normal range of motion. No JVD present. No thyromegaly present.  Cardiovascular: Normal rate, regular rhythm, normal heart sounds and intact distal pulses. Exam reveals no gallop and no friction rub.  No murmur heard. Pulmonary/Chest: Effort normal and breath sounds normal. No respiratory distress. He has no wheezes. He has no rales. He exhibits no tenderness.  Abdominal: Soft. Bowel sounds are normal. He exhibits no distension and no mass. There is no tenderness. There  is no rebound and no guarding.  Musculoskeletal: Normal range of motion. He exhibits tenderness. He exhibits no edema.  Lymphadenopathy:    He has no cervical adenopathy.  Neurological: He is alert and oriented to person, place, and time. He has normal reflexes. No cranial nerve deficit. He exhibits normal muscle tone. He displays a negative Romberg sign. Coordination abnormal. Gait normal.  Skin: Skin is warm and dry. No rash noted.  Psychiatric: He has a normal mood and affect. His behavior is normal. Judgment and thought content normal.  LS tender Weak LEs  Lab  Results  Component Value Date   WBC 6.5 05/24/2017   HGB 16.1 05/24/2017   HCT 47.7 05/24/2017   PLT 240 05/24/2017   GLUCOSE 122 05/24/2017   CHOL 199 02/25/2014   TRIG 239 (H) 02/25/2014   HDL 43 02/25/2014   LDLDIRECT 142.3 04/12/2010   LDLCALC 108 (H) 02/25/2014   ALT 23 05/24/2017   AST 21 05/24/2017   NA 141 05/24/2017   K 4.6 05/24/2017   CL 103 03/13/2017   CREATININE 0.8 05/24/2017   BUN 9.8 05/24/2017   CO2 27 05/24/2017   TSH 0.47 06/13/2016   PSA 1.57 06/13/2016   HGBA1C 7.1 (H) 03/13/2017    Ct Chest W Contrast  Result Date: 05/24/2017 CLINICAL DATA:  Follow-up colon cancer, chemotherapy ongoing EXAM: CT CHEST, ABDOMEN, AND PELVIS WITH CONTRAST TECHNIQUE: Multidetector CT imaging of the chest, abdomen and pelvis was performed following the standard protocol during bolus administration of intravenous contrast. CONTRAST:  173mL ISOVUE-300 IOPAMIDOL (ISOVUE-300) INJECTION 61% COMPARISON:  05/17/2016 FINDINGS: CT CHEST FINDINGS Cardiovascular: Heart is normal in size.  No pericardial effusion. No evidence of thoracic aortic aneurysm. Mediastinum/Nodes: No suspicious mediastinal, hilar, or axillary lymphadenopathy. Visualized thyroid is notable for a stable 13 mm right thyroid nodule. Lungs/Pleura: Stable 5 x 6 mm calcified granuloma in the right upper lobe (series 6/image 43). Two triangular subpleural lymph nodes in the right middle lobe measuring 4-5 mm (series 6/images 69 and 77). Additional 5 mm triangular subpleural lymph node in the right upper lobe (series 6/image 59). No focal consolidation. No pleural effusion or pneumothorax. Musculoskeletal: Degenerative changes of the thoracic spine. CT ABDOMEN PELVIS FINDINGS Hepatobiliary: Liver is within normal limits. Gallbladder is unremarkable. No intrahepatic or extrahepatic ductal dilatation. Pancreas: Parenchymal calcifications, suggesting sequela of prior/ chronic pancreatitis. Spleen: Within normal limits. Adrenals/Urinary  Tract: Adrenal glands are within normal limits. 7 mm anterior left lower pole renal cyst (series 7/ image 24). Right kidney is within normal limits. No hydronephrosis. Bladder is within normal limits. Stomach/Bowel: Stomach is within normal limits. No evidence of bowel obstruction. Status post right hemicolectomy with appendectomy. Mild left colonic diverticulosis, without evidence of diverticulitis. Vascular/Lymphatic: No evidence of abdominal aortic aneurysm. No suspicious abdominopelvic lymphadenopathy. Small upper abdominal lymph nodes measuring 70 mm short axis, within normal limits. Reproductive: Prostatomegaly. Other: No abdominopelvic ascites. Musculoskeletal: Postsurgical changes with laminectomy and ray cage fusion at L4-5 and L5-S1. IMPRESSION: Status post right hemicolectomy with appendectomy. No findings suspicious for recurrent or metastatic disease. Additional stable ancillary findings as above. Electronically Signed   By: Julian Hy M.D.   On: 05/24/2017 14:30   Ct Abdomen Pelvis W Contrast  Result Date: 05/24/2017 CLINICAL DATA:  Follow-up colon cancer, chemotherapy ongoing EXAM: CT CHEST, ABDOMEN, AND PELVIS WITH CONTRAST TECHNIQUE: Multidetector CT imaging of the chest, abdomen and pelvis was performed following the standard protocol during bolus administration of intravenous contrast. CONTRAST:  138mL ISOVUE-300 IOPAMIDOL (  ISOVUE-300) INJECTION 61% COMPARISON:  05/17/2016 FINDINGS: CT CHEST FINDINGS Cardiovascular: Heart is normal in size.  No pericardial effusion. No evidence of thoracic aortic aneurysm. Mediastinum/Nodes: No suspicious mediastinal, hilar, or axillary lymphadenopathy. Visualized thyroid is notable for a stable 13 mm right thyroid nodule. Lungs/Pleura: Stable 5 x 6 mm calcified granuloma in the right upper lobe (series 6/image 43). Two triangular subpleural lymph nodes in the right middle lobe measuring 4-5 mm (series 6/images 69 and 77). Additional 5 mm triangular  subpleural lymph node in the right upper lobe (series 6/image 59). No focal consolidation. No pleural effusion or pneumothorax. Musculoskeletal: Degenerative changes of the thoracic spine. CT ABDOMEN PELVIS FINDINGS Hepatobiliary: Liver is within normal limits. Gallbladder is unremarkable. No intrahepatic or extrahepatic ductal dilatation. Pancreas: Parenchymal calcifications, suggesting sequela of prior/ chronic pancreatitis. Spleen: Within normal limits. Adrenals/Urinary Tract: Adrenal glands are within normal limits. 7 mm anterior left lower pole renal cyst (series 7/ image 24). Right kidney is within normal limits. No hydronephrosis. Bladder is within normal limits. Stomach/Bowel: Stomach is within normal limits. No evidence of bowel obstruction. Status post right hemicolectomy with appendectomy. Mild left colonic diverticulosis, without evidence of diverticulitis. Vascular/Lymphatic: No evidence of abdominal aortic aneurysm. No suspicious abdominopelvic lymphadenopathy. Small upper abdominal lymph nodes measuring 70 mm short axis, within normal limits. Reproductive: Prostatomegaly. Other: No abdominopelvic ascites. Musculoskeletal: Postsurgical changes with laminectomy and ray cage fusion at L4-5 and L5-S1. IMPRESSION: Status post right hemicolectomy with appendectomy. No findings suspicious for recurrent or metastatic disease. Additional stable ancillary findings as above. Electronically Signed   By: Julian Hy M.D.   On: 05/24/2017 14:30    Assessment & Plan:   Diagnoses and all orders for this visit:  Colon cancer, ascending ([place])  Malignant neoplasm of ascending colon ([place])  Controlled type 2 diabetes mellitus with diabetic polyneuropathy, without long-term current use of insulin ([facility])  Hereditary hemochromatosis ([place])  Chronic midline low back pain with sciatica, sciatica laterality unspecified  Other orders -     Discontinue: methadone (DOLOPHINE) 10 MG tablet; Take 2 tablets (20  mg total) by mouth 4 (four) times daily as needed for severe pain. For pain - FILL ON OR AFTER 06/26/2017 -     Discontinue: methadone (DOLOPHINE) 10 MG tablet; Take 2 tablets (20 mg total) by mouth 4 (four) times daily as needed for severe pain. For pain - FILL ON OR AFTER 07/27/2017 -     methadone (DOLOPHINE) 10 MG tablet; Take 2 tablets (20 mg total) by mouth 4 (four) times daily as needed for severe pain. For pain - FILL ON OR AFTER 08/27/2017   I have discontinued Gabriel Erickson's methadone and methadone. I have also changed his methadone. Additionally, I am having him maintain his ACCU-CHEK SOFTCLIX LANCETS, Cholecalciferol, b complex vitamins, fluticasone, ranitidine, diazepam, glimepiride, and ONE TOUCH ULTRA TEST.  Meds ordered this encounter  Medications  . DISCONTD: methadone (DOLOPHINE) 10 MG tablet    Sig: Take 2 tablets (20 mg total) by mouth 4 (four) times daily as needed for severe pain. For pain - FILL ON OR AFTER 06/26/2017    Dispense:  240 tablet    Refill:  0  . DISCONTD: methadone (DOLOPHINE) 10 MG tablet    Sig: Take 2 tablets (20 mg total) by mouth 4 (four) times daily as needed for severe pain. For pain - FILL ON OR AFTER 07/27/2017    Dispense:  240 tablet    Refill:  0  . methadone (DOLOPHINE) 10  MG tablet    Sig: Take 2 tablets (20 mg total) by mouth 4 (four) times daily as needed for severe pain. For pain - FILL ON OR AFTER 08/27/2017    Dispense:  240 tablet    Refill:  0     Follow-up: Return in about 3 months (around 09/17/2017) for Wellness Exam.  Walker Kehr, MD

## 2017-06-19 NOTE — Assessment & Plan Note (Signed)
Labs

## 2017-06-19 NOTE — Assessment & Plan Note (Signed)
On Methadone 

## 2017-06-19 NOTE — Assessment & Plan Note (Signed)
Colonoscopy pending  CEA was ok

## 2017-06-19 NOTE — Assessment & Plan Note (Signed)
Glimepiride po

## 2017-06-20 LAB — MICROALBUMIN / CREATININE URINE RATIO
Creatinine,U: 88.7 mg/dL
Microalb Creat Ratio: 0.8 mg/g (ref 0.0–30.0)

## 2017-07-14 ENCOUNTER — Ambulatory Visit (AMBULATORY_SURGERY_CENTER): Payer: Self-pay | Admitting: *Deleted

## 2017-07-14 ENCOUNTER — Other Ambulatory Visit: Payer: Self-pay

## 2017-07-14 VITALS — Ht 72.0 in | Wt 179.0 lb

## 2017-07-14 DIAGNOSIS — Z85038 Personal history of other malignant neoplasm of large intestine: Secondary | ICD-10-CM

## 2017-07-14 NOTE — Progress Notes (Signed)
No egg or soy allergy known to patient  No issues with past sedation with any surgeries  or procedures, no intubation problems  No diet pills per patient No home 02 use per patient  No blood thinners per patient  Pt denies issues with constipation  No A fib or A flutter  EMMI video sent to pt's e mail pt declined   

## 2017-07-26 ENCOUNTER — Ambulatory Visit (AMBULATORY_SURGERY_CENTER): Payer: Medicare Other | Admitting: Gastroenterology

## 2017-07-26 ENCOUNTER — Encounter: Payer: Self-pay | Admitting: Gastroenterology

## 2017-07-26 ENCOUNTER — Other Ambulatory Visit: Payer: Self-pay

## 2017-07-26 VITALS — BP 110/66 | HR 63 | Temp 97.5°F | Resp 14 | Ht 72.0 in | Wt 177.0 lb

## 2017-07-26 DIAGNOSIS — D123 Benign neoplasm of transverse colon: Secondary | ICD-10-CM | POA: Diagnosis not present

## 2017-07-26 DIAGNOSIS — Z85038 Personal history of other malignant neoplasm of large intestine: Secondary | ICD-10-CM | POA: Diagnosis present

## 2017-07-26 DIAGNOSIS — D124 Benign neoplasm of descending colon: Secondary | ICD-10-CM

## 2017-07-26 DIAGNOSIS — D122 Benign neoplasm of ascending colon: Secondary | ICD-10-CM

## 2017-07-26 MED ORDER — SODIUM CHLORIDE 0.9 % IV SOLN: 500.0000 mL | Freq: Once | INTRAVENOUS | Status: DC

## 2017-07-26 NOTE — Progress Notes (Signed)
A/ox3 pleased with MAC, report to [employer]

## 2017-07-26 NOTE — Op Note (Signed)
[facility] Patient Name: Gabriel Erickson Procedure Date: 07/26/2017 8:25 AM MRN: [id number] Endoscopist: Remo Lipps P. Kenise Barraco MD, MD Age: 77 Referring MD:  Date of Birth: 24-Jan-1941 Gender: Male Account #: [id number] Procedure:                Colonoscopy Indications:              High risk colon cancer surveillance: Personal                            history of colon cancer of ascending colon. S/p                            surgery and chemotherapy Medicines:                Monitored Anesthesia Care Procedure:                Pre-Anesthesia Assessment:                           - Prior to the procedure, a History and Physical                            was performed, and patient medications and                            allergies were reviewed. The patient's tolerance of                            previous anesthesia was also reviewed. The risks                            and benefits of the procedure and the sedation                            options and risks were discussed with the patient.                            All questions were answered, and informed consent                            was obtained. Prior Anticoagulants: The patient has                            taken no previous anticoagulant or antiplatelet                            agents. ASA Grade Assessment: II - A patient with                            mild systemic disease. After reviewing the risks                            and benefits, the patient was deemed in  satisfactory condition to undergo the procedure.                           After obtaining informed consent, the colonoscope                            was passed under direct vision. Throughout the                            procedure, the patient's blood pressure, pulse, and                            oxygen saturations were monitored continuously. The                            Colonoscope was introduced through the  anus and                            advanced to the the ileocolonic anastomosis. The                            colonoscopy was performed without difficulty. The                            patient tolerated the procedure well. The quality                            of the bowel preparation was adequate. The rectum,                            surgical anastomosis were photographed. Scope In: 8:43:18 AM Scope Out: 9:05:44 AM Scope Withdrawal Time: 0 hours 18 minutes 0 seconds  Total Procedure Duration: 0 hours 22 minutes 26 seconds  Findings:                 The perianal and digital rectal examinations were                            normal.                           There was evidence of a prior end-to-end                            ileo-colonic anastomosis in the ascending colon.                            This was patent and was characterized by healthy                            appearing mucosa.                           A diminutive polyp was found in the ascending  colon. The polyp was sessile. The polyp was removed                            with a cold biopsy forceps. Resection and retrieval                            were complete.                           A 3 mm polyp was found in the transverse colon. The                            polyp was sessile. The polyp was removed with a                            cold biopsy forceps. Resection and retrieval were                            complete.                           A 4 mm polyp was found in the descending colon. The                            polyp was sessile. The polyp was removed with a                            cold snare. Resection and retrieval were complete.                           Many small and large-mouthed diverticula were found                            in the left colon.                           Internal hemorrhoids were found during                            retroflexion. The  hemorrhoids were small.                           The exam was otherwise without abnormality. Complications:            No immediate complications. Estimated blood loss:                            Minimal. Estimated Blood Loss:     Estimated blood loss was minimal. Impression:               - Patent end-to-end ileo-colonic anastomosis,                            characterized by healthy appearing mucosa.                           -  One diminutive polyp in the ascending colon,                            removed with a cold biopsy forceps. Resected and                            retrieved.                           - One 3 mm polyp in the transverse colon, removed                            with a cold biopsy forceps. Resected and retrieved.                           - One 4 mm polyp in the descending colon, removed                            with a cold snare. Resected and retrieved.                           - Diverticulosis in the left colon.                           - Internal hemorrhoids.                           - The examination was otherwise normal. Recommendation:           - Patient has a contact number available for                            emergencies. The signs and symptoms of potential                            delayed complications were discussed with the                            patient. Return to normal activities tomorrow.                            Written discharge instructions were provided to the                            patient.                           - Resume previous diet.                           - Continue present medications.                           - Await pathology results.                           -  Repeat colonoscopy in 3 years for surveillance. Remo Lipps P. Zyonna Vardaman MD, MD 07/26/2017 9:11:27 AM This report has been signed electronically.

## 2017-07-26 NOTE — Patient Instructions (Signed)
**  Handouts given to patient on hemorrhoids, diverticulosis, and polps**    YOU HAD AN ENDOSCOPIC PROCEDURE TODAY AT [facility]:   Refer to the procedure report that was given to you for any specific questions about what was found during the examination.  If the procedure report does not answer your questions, please call your gastroenterologist to clarify.  If you requested that your care partner not be given the details of your procedure findings, then the procedure report has been included in a sealed envelope for you to review at your convenience later.  YOU SHOULD EXPECT: Some feelings of bloating in the abdomen. Passage of more gas than usual.  Walking can help get rid of the air that was put into your GI tract during the procedure and reduce the bloating. If you had a lower endoscopy (such as a colonoscopy or flexible sigmoidoscopy) you may notice spotting of blood in your stool or on the toilet paper. If you underwent a bowel prep for your procedure, you may not have a normal bowel movement for a few days.  Please Note:  You might notice some irritation and congestion in your nose or some drainage.  This is from the oxygen used during your procedure.  There is no need for concern and it should clear up in a day or so.  SYMPTOMS TO REPORT IMMEDIATELY:   Following lower endoscopy (colonoscopy or flexible sigmoidoscopy):  Excessive amounts of blood in the stool  Significant tenderness or worsening of abdominal pains  Swelling of the abdomen that is new, acute  Fever of 100F or higher  For urgent or emergent issues, a gastroenterologist can be reached at any hour by calling [phone].   DIET:  We do recommend a small meal at first, but then you may proceed to your regular diet.  Drink plenty of fluids but you should avoid alcoholic beverages for 24 hours.  ACTIVITY:  You should plan to take it easy for the rest of today and you should NOT DRIVE or use heavy  machinery until tomorrow (because of the sedation medicines used during the test).    FOLLOW UP: Our staff will call the number listed on your records the next business day following your procedure to check on you and address any questions or concerns that you may have regarding the information given to you following your procedure. If we do not reach you, we will leave a message.  However, if you are feeling well and you are not experiencing any problems, there is no need to return our call.  We will assume that you have returned to your regular daily activities without incident.  If any biopsies were taken you will be contacted by phone or by letter within the next 1-3 weeks.  Please call us at [phone] if you have not heard about the biopsies in 3 weeks.    SIGNATURES/CONFIDENTIALITY: You and/or your care partner have signed paperwork which will be entered into your electronic medical record.  These signatures attest to the fact that that the information above on your After Visit Summary has been reviewed and is understood.  Full responsibility of the confidentiality of this discharge information lies with you and/or your care-partner.

## 2017-07-26 NOTE — Progress Notes (Signed)
Pt's states no medical or surgical changes since previsit or office visit.Pt denies allergy to soy or eggs.

## 2017-07-26 NOTE — Progress Notes (Signed)
Called to room to assist during endoscopic procedure.  Patient ID and intended procedure confirmed with present staff. Received instructions for my participation in the procedure from the performing physician.  

## 2017-07-27 ENCOUNTER — Telehealth: Payer: Self-pay

## 2017-07-27 NOTE — Telephone Encounter (Signed)
  Follow up Call-  Call back number 07/26/2017 05/07/2015  Post procedure Call Back phone  # [phone]  Permission to leave phone message Yes Yes  Some recent data might be hidden     Patient questions:  Do you have a fever, pain , or abdominal swelling? No. Pain Score  0 *  Have you tolerated food without any problems? Yes.    Have you been able to return to your normal activities? Yes.    Do you have any questions about your discharge instructions: Diet   No. Medications  No. Follow up visit  No.  Do you have questions or concerns about your Care? No.  Actions: * If pain score is 4 or above: No action needed, pain <4.

## 2017-08-01 ENCOUNTER — Encounter: Payer: Self-pay | Admitting: Gastroenterology

## 2017-08-02 ENCOUNTER — Telehealth: Payer: Self-pay | Admitting: Internal Medicine

## 2017-08-02 NOTE — Telephone Encounter (Signed)
Copied from [place] [id number]. Topic: Quick Communication - See Telephone Encounter >> Aug 02, 2017  8:45 AM Ether Griffins B wrote: CRM for notification. See Telephone encounter for:  Pt needing PA done for methadone. Pharmacy faxed over a paper regarding methadone. And this was last Thursday or Friday.  08/02/17.

## 2017-08-03 NOTE — Telephone Encounter (Signed)
Key: [id number]

## 2017-08-03 NOTE — Telephone Encounter (Signed)
Pt called in to follow up on PA for medication. Pt says that he will be out of his medication soon. Pt would like a call back with a status.                               CB: [phone]

## 2017-08-04 NOTE — Telephone Encounter (Signed)
PA approved through 07/17/18 

## 2017-08-07 ENCOUNTER — Other Ambulatory Visit: Payer: Self-pay | Admitting: Internal Medicine

## 2017-09-19 ENCOUNTER — Ambulatory Visit (INDEPENDENT_AMBULATORY_CARE_PROVIDER_SITE_OTHER): Payer: Medicare Other | Admitting: Internal Medicine

## 2017-09-19 ENCOUNTER — Encounter: Payer: Self-pay | Admitting: Internal Medicine

## 2017-09-19 ENCOUNTER — Other Ambulatory Visit (INDEPENDENT_AMBULATORY_CARE_PROVIDER_SITE_OTHER): Payer: Medicare Other

## 2017-09-19 VITALS — BP 136/72 | HR 89 | Temp 98.2°F | Ht 72.0 in | Wt 179.0 lb

## 2017-09-19 DIAGNOSIS — M5440 Lumbago with sciatica, unspecified side: Secondary | ICD-10-CM

## 2017-09-19 DIAGNOSIS — C182 Malignant neoplasm of ascending colon: Secondary | ICD-10-CM

## 2017-09-19 DIAGNOSIS — G8929 Other chronic pain: Secondary | ICD-10-CM | POA: Diagnosis not present

## 2017-09-19 DIAGNOSIS — E119 Type 2 diabetes mellitus without complications: Secondary | ICD-10-CM

## 2017-09-19 DIAGNOSIS — Z0000 Encounter for general adult medical examination without abnormal findings: Secondary | ICD-10-CM | POA: Diagnosis not present

## 2017-09-19 DIAGNOSIS — E1142 Type 2 diabetes mellitus with diabetic polyneuropathy: Secondary | ICD-10-CM | POA: Diagnosis not present

## 2017-09-19 LAB — BASIC METABOLIC PANEL
BUN: 13 mg/dL (ref 6–23)
CALCIUM: 9.9 mg/dL (ref 8.4–10.5)
CO2: 32 mEq/L (ref 19–32)
Chloride: 100 mEq/L (ref 96–112)
Creatinine, Ser: 0.87 mg/dL (ref 0.40–1.50)
GFR: 91.06 mL/min (ref >60.00)
Glucose, Bld: 166 mg/dL — ABNORMAL HIGH (ref 70–99)
Potassium: 4.7 mEq/L (ref 3.5–5.1)
Sodium: 136 mEq/L (ref 135–145)

## 2017-09-19 LAB — CBC WITH DIFFERENTIAL/PLATELET
Basophils Absolute: 0 10*3/uL (ref 0.0–0.1)
Basophils Relative: 0.6 % (ref 0.0–3.0)
EOS ABS: 0 10*3/uL (ref 0.0–0.7)
Eosinophils Relative: 0.8 % (ref 0.0–5.0)
HCT: 47.9 % (ref 39.0–52.0)
HEMOGLOBIN: 16.7 g/dL (ref 13.0–17.0)
Lymphocytes Relative: 27.8 % (ref 12.0–46.0)
Lymphs Abs: 1.6 10*3/uL (ref 0.7–4.0)
MCHC: 34.9 g/dL (ref 30.0–36.0)
MCV: 95.3 fl (ref 78.0–100.0)
MONO ABS: 0.6 10*3/uL (ref 0.1–1.0)
Monocytes Relative: 11 % (ref 3.0–12.0)
Neutro Abs: 3.5 10*3/uL (ref 1.4–7.7)
Neutrophils Relative %: 59.8 % (ref 43.0–77.0)
Platelets: 213 10*3/uL (ref 150.0–400.0)
RBC: 5.03 Mil/uL (ref 4.22–5.81)
RDW: 12.7 % (ref 11.5–15.5)
WBC: 5.8 10*3/uL (ref 4.0–10.5)

## 2017-09-19 LAB — HEMOGLOBIN A1C: Hgb A1c MFr Bld: 8.3 % — ABNORMAL HIGH (ref 4.6–6.5)

## 2017-09-19 LAB — HEPATIC FUNCTION PANEL
ALK PHOS: 82 U/L (ref 39–117)
ALT: 26 U/L (ref 0–53)
AST: 22 U/L (ref 0–37)
Albumin: 4.1 g/dL (ref 3.5–5.2)
BILIRUBIN DIRECT: 0.1 mg/dL (ref 0.0–0.3)
TOTAL PROTEIN: 7.5 g/dL (ref 6.0–8.3)
Total Bilirubin: 0.9 mg/dL (ref 0.2–1.2)

## 2017-09-19 MED ORDER — METHADONE HCL 10 MG PO TABS: 20.0000 mg | ORAL_TABLET | Freq: Four times a day (QID) | ORAL | 0 refills | Status: DC | PRN

## 2017-09-19 MED ORDER — PIOGLITAZONE HCL 15 MG PO TABS: 15.0000 mg | ORAL_TABLET | Freq: Every day | ORAL | 11 refills | Status: DC

## 2017-09-19 NOTE — Assessment & Plan Note (Signed)
Colon due in 2022

## 2017-09-19 NOTE — Assessment & Plan Note (Signed)
Phlebotomies  

## 2017-09-19 NOTE — Assessment & Plan Note (Signed)
Here for medicare wellness/physical  Diet: heart healthy  Physical activity: not sedentary  Depression/mood screen: negative  Hearing: intact to whispered voice  Visual acuity: grossly normal w/glasses, performs annual eye exam  ADLs: capable  Fall risk: low  Home safety: good  Cognitive evaluation: intact to orientation, naming, recall and repetition  EOL planning: adv directives, full code/ I agree  I have personally reviewed and have noted  1. The patient's medical, surgical and social history  2. Their use of alcohol, tobacco or illicit drugs  3. Their current medications and supplements  4. The patient's functional ability including ADL's, fall risks, home safety risks and hearing or visual impairment.  5. Diet and physical activities  6. Evidence for depression or mood disorders 7. The roster of all physicians providing medical care to patient - is listed in the Snapshot section of the chart and reviewed today.    Today patient counseled on age appropriate routine health concerns for screening and prevention, each reviewed and up to date or declined. Immunizations reviewed and up to date or declined. Labs ordered and reviewed. Risk factors for depression reviewed and negative. Hearing function and visual acuity are intact. ADLs screened and addressed as needed. Functional ability and level of safety reviewed and appropriate. Education, counseling and referrals performed based on assessed risks today. Patient provided with a copy of personalized plan for preventive services.      

## 2017-09-19 NOTE — Assessment & Plan Note (Addendum)
Worse  On Amaryl Try to increase diet/activity Pt refused Actos May need to increase Rx

## 2017-09-19 NOTE — Assessment & Plan Note (Addendum)
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 addiction risk, apnea etc) and complications (i.e. Somnolence, constipation and others) were explained to the patient and were aknowledged.  Diazepam

## 2017-09-19 NOTE — Progress Notes (Signed)
Subjective:  Patient ID: Gabriel Erickson, male    DOB: [birth year]  Age: 77 y.o. MRN: [id number]  CC: No chief complaint on file.   HPI Carman Essick [facility] presents for a well exam F/u DM, HTN, LBP   Outpatient Medications Prior to Visit  Medication Sig Dispense Refill  . ACCU-CHEK SOFTCLIX LANCETS lancets by Other route 2 (two) times daily. Use as instructed     . b complex vitamins tablet Take 1 tablet by mouth daily.      . Cholecalciferol 1000 UNITS tablet Take 1,000 Units by mouth daily.      . diazepam (VALIUM) 5 MG tablet Take 1 tablet (5 mg total) by mouth 2 (two) times daily as needed for muscle spasms (spasms). For cramps or insomnia 60 tablet 3  . fluticasone (FLONASE) 50 MCG/ACT nasal spray Place 2 sprays into the nose daily. 16 g 11  . glimepiride (AMARYL) 4 MG tablet Take 1 tablet (4 mg total) by mouth 2 (two) times daily. 180 tablet 3  . glimepiride (AMARYL) 4 MG tablet TAKE 1 TABLET (4 MG TOTAL) BY MOUTH 2 (TWO) TIMES DAILY. 180 tablet 1  . methadone (DOLOPHINE) 10 MG tablet Take 2 tablets (20 mg total) by mouth 4 (four) times daily as needed for severe pain. For pain - FILL ON OR AFTER 08/27/2017 240 tablet 0  . ONE TOUCH ULTRA TEST test strip USE TO CHECK BLOOD SUGAR  TWO TIMES DAILY 300 each 3  . ranitidine (ZANTAC) 75 MG tablet Take 75 mg by mouth 2 (two) times daily.     No facility-administered medications prior to visit.     ROS Review of Systems  Constitutional: Positive for fatigue. Negative for appetite change and unexpected weight change.  HENT: Negative for congestion, nosebleeds, sneezing, sore throat and trouble swallowing.   Eyes: Negative for itching and visual disturbance.  Respiratory: Negative for cough.   Cardiovascular: Negative for chest pain, palpitations and leg swelling.  Gastrointestinal: Negative for abdominal distention, blood in stool, diarrhea and nausea.  Genitourinary: Negative for frequency and hematuria.  Musculoskeletal: Positive for back pain  and gait problem. Negative for joint swelling and neck pain.  Skin: Negative for rash.  Neurological: Negative for dizziness, tremors, speech difficulty and weakness.  Psychiatric/Behavioral: Negative for agitation, dysphoric mood and sleep disturbance. The patient is not nervous/anxious.     Objective:  BP 136/72 (BP Location: Left Arm, Patient Position: Sitting, Cuff Size: Large)   Pulse 89   Temp 98.2 F (36.8 C) (Oral)   Ht 6' (1.829 m)   Wt 179 lb (81.2 kg)   SpO2 98%   BMI 24.28 kg/m   BP Readings from Last 3 Encounters:  09/19/17 136/72  07/26/17 110/66  06/19/17 (!) 162/80    Wt Readings from Last 3 Encounters:  09/19/17 179 lb (81.2 kg)  07/26/17 177 lb (80.3 kg)  07/14/17 179 lb (81.2 kg)    Physical Exam  Constitutional: He is oriented to person, place, and time. He appears well-developed. No distress.  NAD  HENT:  Mouth/Throat: Oropharynx is clear and moist.  Eyes: Conjunctivae are normal. Pupils are equal, round, and reactive to light.  Neck: Normal range of motion. No JVD present. No thyromegaly present.  Cardiovascular: Normal rate, regular rhythm, normal heart sounds and intact distal pulses. Exam reveals no gallop and no friction rub.  No murmur heard. Pulmonary/Chest: Effort normal and breath sounds normal. No respiratory distress. He has no wheezes. He has no rales.  He exhibits no tenderness.  Abdominal: Soft. Bowel sounds are normal. He exhibits no distension and no mass. There is no tenderness. There is no rebound and no guarding.  Musculoskeletal: Normal range of motion. He exhibits tenderness. He exhibits no edema.  Lymphadenopathy:    He has no cervical adenopathy.  Neurological: He is alert and oriented to person, place, and time. He has normal reflexes. No cranial nerve deficit. He exhibits normal muscle tone. He displays a negative Romberg sign. Coordination abnormal. Gait normal.  Skin: Skin is warm and dry. No rash noted.  Psychiatric: He has  a normal mood and affect. His behavior is normal. Judgment and thought content normal.  LS tender LE weak  Lab Results  Component Value Date   WBC 5.8 09/19/2017   HGB 16.7 09/19/2017   HCT 47.9 09/19/2017   PLT 213.0 09/19/2017   GLUCOSE 166 (H) 09/19/2017   CHOL 199 02/25/2014   TRIG 239 (H) 02/25/2014   HDL 43 02/25/2014   LDLDIRECT 142.3 04/12/2010   LDLCALC 108 (H) 02/25/2014   ALT 26 09/19/2017   AST 22 09/19/2017   NA 136 09/19/2017   K 4.7 09/19/2017   CL 100 09/19/2017   CREATININE 0.87 09/19/2017   BUN 13 09/19/2017   CO2 32 09/19/2017   TSH 0.42 06/19/2017   PSA 3.22 06/19/2017   HGBA1C 8.3 (H) 09/19/2017   MICROALBUR <0.7 06/19/2017    Ct Chest W Contrast  Result Date: 05/24/2017 CLINICAL DATA:  Follow-up colon cancer, chemotherapy ongoing EXAM: CT CHEST, ABDOMEN, AND PELVIS WITH CONTRAST TECHNIQUE: Multidetector CT imaging of the chest, abdomen and pelvis was performed following the standard protocol during bolus administration of intravenous contrast. CONTRAST:  160mL ISOVUE-300 IOPAMIDOL (ISOVUE-300) INJECTION 61% COMPARISON:  05/17/2016 FINDINGS: CT CHEST FINDINGS Cardiovascular: Heart is normal in size.  No pericardial effusion. No evidence of thoracic aortic aneurysm. Mediastinum/Nodes: No suspicious mediastinal, hilar, or axillary lymphadenopathy. Visualized thyroid is notable for a stable 13 mm right thyroid nodule. Lungs/Pleura: Stable 5 x 6 mm calcified granuloma in the right upper lobe (series 6/image 43). Two triangular subpleural lymph nodes in the right middle lobe measuring 4-5 mm (series 6/images 69 and 77). Additional 5 mm triangular subpleural lymph node in the right upper lobe (series 6/image 59). No focal consolidation. No pleural effusion or pneumothorax. Musculoskeletal: Degenerative changes of the thoracic spine. CT ABDOMEN PELVIS FINDINGS Hepatobiliary: Liver is within normal limits. Gallbladder is unremarkable. No intrahepatic or extrahepatic  ductal dilatation. Pancreas: Parenchymal calcifications, suggesting sequela of prior/ chronic pancreatitis. Spleen: Within normal limits. Adrenals/Urinary Tract: Adrenal glands are within normal limits. 7 mm anterior left lower pole renal cyst (series 7/ image 24). Right kidney is within normal limits. No hydronephrosis. Bladder is within normal limits. Stomach/Bowel: Stomach is within normal limits. No evidence of bowel obstruction. Status post right hemicolectomy with appendectomy. Mild left colonic diverticulosis, without evidence of diverticulitis. Vascular/Lymphatic: No evidence of abdominal aortic aneurysm. No suspicious abdominopelvic lymphadenopathy. Small upper abdominal lymph nodes measuring 70 mm short axis, within normal limits. Reproductive: Prostatomegaly. Other: No abdominopelvic ascites. Musculoskeletal: Postsurgical changes with laminectomy and ray cage fusion at L4-5 and L5-S1. IMPRESSION: Status post right hemicolectomy with appendectomy. No findings suspicious for recurrent or metastatic disease. Additional stable ancillary findings as above. Electronically Signed   By: Julian Hy M.D.   On: 05/24/2017 14:30   Ct Abdomen Pelvis W Contrast  Result Date: 05/24/2017 CLINICAL DATA:  Follow-up colon cancer, chemotherapy ongoing EXAM: CT CHEST, ABDOMEN, AND PELVIS  WITH CONTRAST TECHNIQUE: Multidetector CT imaging of the chest, abdomen and pelvis was performed following the standard protocol during bolus administration of intravenous contrast. CONTRAST:  118mL ISOVUE-300 IOPAMIDOL (ISOVUE-300) INJECTION 61% COMPARISON:  05/17/2016 FINDINGS: CT CHEST FINDINGS Cardiovascular: Heart is normal in size.  No pericardial effusion. No evidence of thoracic aortic aneurysm. Mediastinum/Nodes: No suspicious mediastinal, hilar, or axillary lymphadenopathy. Visualized thyroid is notable for a stable 13 mm right thyroid nodule. Lungs/Pleura: Stable 5 x 6 mm calcified granuloma in the right upper lobe  (series 6/image 43). Two triangular subpleural lymph nodes in the right middle lobe measuring 4-5 mm (series 6/images 69 and 77). Additional 5 mm triangular subpleural lymph node in the right upper lobe (series 6/image 59). No focal consolidation. No pleural effusion or pneumothorax. Musculoskeletal: Degenerative changes of the thoracic spine. CT ABDOMEN PELVIS FINDINGS Hepatobiliary: Liver is within normal limits. Gallbladder is unremarkable. No intrahepatic or extrahepatic ductal dilatation. Pancreas: Parenchymal calcifications, suggesting sequela of prior/ chronic pancreatitis. Spleen: Within normal limits. Adrenals/Urinary Tract: Adrenal glands are within normal limits. 7 mm anterior left lower pole renal cyst (series 7/ image 24). Right kidney is within normal limits. No hydronephrosis. Bladder is within normal limits. Stomach/Bowel: Stomach is within normal limits. No evidence of bowel obstruction. Status post right hemicolectomy with appendectomy. Mild left colonic diverticulosis, without evidence of diverticulitis. Vascular/Lymphatic: No evidence of abdominal aortic aneurysm. No suspicious abdominopelvic lymphadenopathy. Small upper abdominal lymph nodes measuring 70 mm short axis, within normal limits. Reproductive: Prostatomegaly. Other: No abdominopelvic ascites. Musculoskeletal: Postsurgical changes with laminectomy and ray cage fusion at L4-5 and L5-S1. IMPRESSION: Status post right hemicolectomy with appendectomy. No findings suspicious for recurrent or metastatic disease. Additional stable ancillary findings as above. Electronically Signed   By: Julian Hy M.D.   On: 05/24/2017 14:30    Assessment & Plan:   There are no diagnoses linked to this encounter. I am having [name] A. Wik maintain his ACCU-CHEK SOFTCLIX LANCETS, Cholecalciferol, b complex vitamins, fluticasone, ranitidine, diazepam, glimepiride, ONE TOUCH ULTRA TEST, methadone, and glimepiride.  No orders of the defined types  were placed in this encounter.    Follow-up: No Follow-up on file.  Walker Kehr, MD

## 2017-09-27 ENCOUNTER — Encounter: Payer: Self-pay | Admitting: Internal Medicine

## 2017-10-20 ENCOUNTER — Telehealth: Payer: Self-pay | Admitting: Internal Medicine

## 2017-10-20 NOTE — Telephone Encounter (Signed)
Copied from [facility] #[id number]. Topic: General - Other >> Oct 20, 2017  8:34 AM Carolyn Stare wrote:  Pt call to say Dr Alain Marion told him he would put him on ACTOS in addition to  glimepiride (AMARYL) 4 MG tablet, he said they have already discussed him being put on this med     Pharmacy:   [facility]

## 2017-10-20 NOTE — Telephone Encounter (Signed)
No documentation at last ov concerning actos being rx will have to hold to MD return back in the office for his response.Marland KitchenJohny Chess

## 2017-10-23 ENCOUNTER — Other Ambulatory Visit: Payer: Self-pay | Admitting: Internal Medicine

## 2017-10-23 MED ORDER — DAPAGLIFLOZIN PROPANEDIOL 5 MG PO TABS: 5.0000 mg | ORAL_TABLET | Freq: Every day | ORAL | 11 refills | Status: DC

## 2017-10-23 NOTE — Telephone Encounter (Signed)
The pt said he had a reaction of some sort to Actos. Is it not correct? See - Allergies Thx

## 2017-10-23 NOTE — Telephone Encounter (Signed)
Notified pt w/MD response. Pt states yes he did have reaction to the actos gave him liver damaged, but he is needing something else for his diabetes. Pt states the glimepiride not working anymore. His BS been running in the 200"s and he saw his A1C has went up to 8.3.Marland KitchenArsenio Erickson

## 2017-10-23 NOTE — Telephone Encounter (Signed)
Options are limited We can add Farxiga if covered - pls see Rx Thx

## 2017-10-24 NOTE — Telephone Encounter (Signed)
Notified pt w/MD response. Pt is goi g to check w/pharmacy to see if insurance will cover and if not will called back w/alternatives.Marland Kitchen/LMB

## 2017-10-27 ENCOUNTER — Telehealth: Payer: Self-pay

## 2017-10-27 MED ORDER — EMPAGLIFLOZIN 10 MG PO TABS: 10.0000 mg | ORAL_TABLET | Freq: Every day | ORAL | 11 refills | Status: DC

## 2017-10-27 NOTE — Telephone Encounter (Signed)
[facility] nurse called and stated patient has not received his [place] because it is not covered because patient has not been on [place]. Please advise about medication. Pt would like it sent in today.

## 2017-10-27 NOTE — Telephone Encounter (Signed)
Pt.notified

## 2017-10-27 NOTE — Telephone Encounter (Signed)
[place] Thx

## 2017-10-31 ENCOUNTER — Telehealth: Payer: Self-pay

## 2017-10-31 NOTE — Telephone Encounter (Signed)
Key: [id number]  Prior auth started today via Cover My Meds.

## 2017-11-23 ENCOUNTER — Encounter: Payer: Self-pay | Admitting: Nurse Practitioner

## 2017-11-23 ENCOUNTER — Inpatient Hospital Stay: Payer: Medicare Other

## 2017-11-23 ENCOUNTER — Telehealth: Payer: Self-pay | Admitting: Nurse Practitioner

## 2017-11-23 ENCOUNTER — Inpatient Hospital Stay: Payer: Medicare Other | Attending: Nurse Practitioner | Admitting: Nurse Practitioner

## 2017-11-23 VITALS — BP 175/93 | HR 77 | Temp 97.7°F | Resp 17 | Ht 72.0 in | Wt 173.2 lb

## 2017-11-23 DIAGNOSIS — C182 Malignant neoplasm of ascending colon: Secondary | ICD-10-CM

## 2017-11-23 DIAGNOSIS — Z800 Family history of malignant neoplasm of digestive organs: Secondary | ICD-10-CM | POA: Diagnosis not present

## 2017-11-23 DIAGNOSIS — Z85038 Personal history of other malignant neoplasm of large intestine: Secondary | ICD-10-CM | POA: Insufficient documentation

## 2017-11-23 DIAGNOSIS — E119 Type 2 diabetes mellitus without complications: Secondary | ICD-10-CM | POA: Insufficient documentation

## 2017-11-23 LAB — CEA (IN HOUSE-CHCC): CEA (CHCC-In House): 1.3 ng/mL (ref 0.00–5.00)

## 2017-11-23 NOTE — Telephone Encounter (Signed)
Scheduled appt per 5/9 los - Gave patient avs and calender per  [place]. Central radiology to contact patient with ct

## 2017-11-23 NOTE — Progress Notes (Addendum)
  Gabriel Erickson OFFICE PROGRESS NOTE   Diagnosis: Colon cancer  INTERVAL HISTORY:   Gabriel Erickson returns as scheduled.  He feels well.  He began a new diabetes medication about a month ago.  Since then he has noticed he is having 2 bowel movements a day rather than 1.  No diarrhea.  He denies any bleeding.  No abdominal pain.  He has a good appetite and good energy level.  Objective:  Vital signs in last 24 hours:  Blood pressure (!) 175/93, pulse 77, temperature 97.7 F (36.5 C), temperature source Oral, resp. rate 17, height 6' (1.829 m), weight 173 lb 3.2 oz (78.6 kg), SpO2 100 %.    HEENT: Neck without mass. Lymphatics: No palpable cervical, supraclavicular, axillary or inguinal lymph nodes. Resp: Lungs clear bilaterally. Cardio: Regular rate and rhythm. GI: Abdomen soft and nontender.  No hepatomegaly. Vascular: No leg edema.   Lab Results:  Lab Results  Component Value Date   WBC 5.8 09/19/2017   HGB 16.7 09/19/2017   HCT 47.9 09/19/2017   MCV 95.3 09/19/2017   PLT 213.0 09/19/2017   NEUTROABS 3.5 09/19/2017    Imaging:  No results found.  Medications: I have reviewed the patient's current medications.  Assessment/Plan: 1. Adenocarcinoma of the ascending colon, stage IIIB (T3, N1b), status post a partial colectomy 06/24/2015 ? 2 of 27 lymph nodes contained metastatic carcinoma, lymphovascular and perineural invasion present ? Microsatellite stable with no loss of mismatch repair protein expression ? Cycle 1 adjuvant Xeloda 07/29/2015 ? Cycle 2 adjuvant Xeloda 08/19/2015 ? Cycle 3 adjuvant Xeloda 09/09/2015 ? Cycle 4 adjuvant Xeloda 10/14/2015 (50% dose reduction due to hand-foot syndrome following cycle 3); cycle 4 placed on hold 10/22/2015 due to foot pain ? Cycle 5 adjuvant Xeloda 11/07/2015 (further dose reduced to 1000 mg every morning and 500 mg every afternoon) ? Cycle 6 adjuvant Xeloda 11/28/2015 (same dose of 1000 mg every morning and 500 mg  every afternoon) ? Cycle 7 adjuvant Xeloda 12/19/2015 (no dose change) ? Cycle 8 adjuvant Xeloda 01/09/2016 ( no dose change) ? Surveillance CT scans 05/17/2016-no evidence of recurrent colon cancer ? Surveillance CT scans 05/24/2017-no evidence of recurrent colon cancer ? Surveillance colonoscopy 07/26/2017- polyp ascending colon, polyp transverse colon, polyp descending colon (TUBULAR ADENOMA (TWO FRAGMENTS) WITHOUT HIGH GRADE DYSPLASIA OR MALIGNANCY)  2. Diabetes  3. Hemachromatosis heterozygote  4. Family history of colon, ovarian, and biliary tract cancers  5. Indeterminate lung lesions on the chest CT 05/15/2015  6. Anemia, due to iron deficiency- resolved  7. Folliculitis-course of doxycycline prescribed 01/06/2016     Disposition: [name] remains in clinical remission from colon cancer.  We will follow-up on the CEA from today.  We are referring him for surveillance CT scans in 6 months.  He will return for a follow-up visit a few days after the CT to review the results.  He will contact the office in the interim with any problems.  We discussed the elevated blood pressure.  He reports "white coat syndrome".  He has normal blood pressure readings at home.  [name] ANP/GNP-BC   11/23/2017  11:13 AM

## 2017-11-24 ENCOUNTER — Telehealth: Payer: Self-pay

## 2017-11-24 NOTE — Telephone Encounter (Addendum)
Left VM msg for pt to call back in regards to note below.   ----- Message from Ladell Pier, MD sent at 11/23/2017  5:17 PM EDT ----- Please call patient, the CEA is normal

## 2017-11-24 NOTE — Telephone Encounter (Addendum)
Spoke with pt. In regards to note below. Pt verbalized understanding of lab results note below.  ----- Message from Ladell Pier, MD sent at 11/23/2017  5:17 PM EDT ----- Please call patient, the CEA is normal

## 2017-12-05 NOTE — Telephone Encounter (Signed)
PA aproved  

## 2017-12-20 ENCOUNTER — Ambulatory Visit: Payer: Medicare Other | Admitting: Internal Medicine

## 2017-12-20 ENCOUNTER — Other Ambulatory Visit (INDEPENDENT_AMBULATORY_CARE_PROVIDER_SITE_OTHER): Payer: Medicare Other

## 2017-12-20 ENCOUNTER — Encounter: Payer: Self-pay | Admitting: Internal Medicine

## 2017-12-20 DIAGNOSIS — M5440 Lumbago with sciatica, unspecified side: Secondary | ICD-10-CM

## 2017-12-20 DIAGNOSIS — E1142 Type 2 diabetes mellitus with diabetic polyneuropathy: Secondary | ICD-10-CM

## 2017-12-20 DIAGNOSIS — E119 Type 2 diabetes mellitus without complications: Secondary | ICD-10-CM | POA: Diagnosis not present

## 2017-12-20 DIAGNOSIS — G8929 Other chronic pain: Secondary | ICD-10-CM

## 2017-12-20 LAB — HEPATIC FUNCTION PANEL
ALT: 19 U/L (ref 0–53)
AST: 20 U/L (ref 0–37)
Albumin: 4.2 g/dL (ref 3.5–5.2)
Alkaline Phosphatase: 89 U/L (ref 39–117)
BILIRUBIN DIRECT: 0.1 mg/dL (ref 0.0–0.3)
BILIRUBIN TOTAL: 0.6 mg/dL (ref 0.2–1.2)
TOTAL PROTEIN: 7.4 g/dL (ref 6.0–8.3)

## 2017-12-20 LAB — BASIC METABOLIC PANEL
BUN: 13 mg/dL (ref 6–23)
CHLORIDE: 102 meq/L (ref 96–112)
CO2: 31 mEq/L (ref 19–32)
Calcium: 10 mg/dL (ref 8.4–10.5)
Creatinine, Ser: 0.83 mg/dL (ref 0.40–1.50)
GFR: 96.08 mL/min (ref >60.00)
Glucose, Bld: 98 mg/dL (ref 70–99)
POTASSIUM: 5.2 meq/L — AB (ref 3.5–5.1)
Sodium: 139 mEq/L (ref 135–145)

## 2017-12-20 LAB — CBC WITH DIFFERENTIAL/PLATELET
BASOS PCT: 0.5 % (ref 0.0–3.0)
Basophils Absolute: 0 10*3/uL (ref 0.0–0.1)
EOS PCT: 0.8 % (ref 0.0–5.0)
Eosinophils Absolute: 0 10*3/uL (ref 0.0–0.7)
HEMATOCRIT: 50.2 % (ref 39.0–52.0)
HEMOGLOBIN: 17.2 g/dL — AB (ref 13.0–17.0)
Lymphocytes Relative: 28.5 % (ref 12.0–46.0)
Lymphs Abs: 1.7 10*3/uL (ref 0.7–4.0)
MCHC: 34.3 g/dL (ref 30.0–36.0)
MCV: 97.1 fl (ref 78.0–100.0)
MONO ABS: 0.6 10*3/uL (ref 0.1–1.0)
MONOS PCT: 10.8 % (ref 3.0–12.0)
Neutro Abs: 3.5 10*3/uL (ref 1.4–7.7)
Neutrophils Relative %: 59.4 % (ref 43.0–77.0)
Platelets: 215 10*3/uL (ref 150.0–400.0)
RBC: 5.17 Mil/uL (ref 4.22–5.81)
RDW: 12.8 % (ref 11.5–15.5)
WBC: 5.9 10*3/uL (ref 4.0–10.5)

## 2017-12-20 LAB — HEMOGLOBIN A1C: Hgb A1c MFr Bld: 6.7 % — ABNORMAL HIGH (ref 4.6–6.5)

## 2017-12-20 MED ORDER — METHADONE HCL 10 MG PO TABS: 20.0000 mg | ORAL_TABLET | Freq: Four times a day (QID) | ORAL | 0 refills | Status: DC | PRN

## 2017-12-20 MED ORDER — DIAZEPAM 5 MG PO TABS: 5.0000 mg | ORAL_TABLET | Freq: Two times a day (BID) | ORAL | 3 refills | Status: DC | PRN

## 2017-12-20 NOTE — Assessment & Plan Note (Signed)
Better w/added Vania Rea

## 2017-12-20 NOTE — Assessment & Plan Note (Signed)
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 addiction risk, apnea etc) and complications (i.e. Somnolence, constipation and others) were explained to the patient and were aknowledged.  Diazepam Benzo use risk is discussed - taking very little 

## 2017-12-20 NOTE — Assessment & Plan Note (Signed)
Phlebotomy - may need to re-start next time

## 2017-12-20 NOTE — Progress Notes (Signed)
Subjective:  Patient ID: Gabriel Erickson, male    DOB: Sep 04, 1940  Age: 77 y.o. MRN: [id number]  CC: No chief complaint on file.   HPI Gabriel Erickson presents for DM, HTN, LBP and hemochromatosis f/u  Outpatient Medications Prior to Visit  Medication Sig Dispense Refill  . ACCU-CHEK SOFTCLIX LANCETS lancets by Other route 2 (two) times daily. Use as instructed     . b complex vitamins tablet Take 1 tablet by mouth daily.      . Cholecalciferol 1000 UNITS tablet Take 1,000 Units by mouth daily.      . diazepam (VALIUM) 5 MG tablet Take 1 tablet (5 mg total) by mouth 2 (two) times daily as needed for muscle spasms (spasms). For cramps or insomnia 60 tablet 3  . empagliflozin (JARDIANCE) 10 MG TABS tablet Take 10 mg by mouth daily. 30 tablet 11  . fluticasone (FLONASE) 50 MCG/ACT nasal spray Place 2 sprays into the nose daily. 16 g 11  . glimepiride (AMARYL) 4 MG tablet TAKE 1 TABLET (4 MG TOTAL) BY MOUTH 2 (TWO) TIMES DAILY. 180 tablet 1  . methadone (DOLOPHINE) 10 MG tablet Take 2 tablets (20 mg total) by mouth 4 (four) times daily as needed for severe pain. For pain - FILL ON OR AFTER 11/24/2017 240 tablet 0  . ONE TOUCH ULTRA TEST test strip USE TO CHECK BLOOD SUGAR  TWO TIMES DAILY 300 each 3  . ranitidine (ZANTAC) 75 MG tablet Take 75 mg by mouth 2 (two) times daily.     No facility-administered medications prior to visit.     ROS: Review of Systems  Constitutional: Negative for appetite change, fatigue and unexpected weight change.  HENT: Negative for congestion, nosebleeds, sneezing, sore throat and trouble swallowing.   Eyes: Negative for itching and visual disturbance.  Respiratory: Negative for cough.   Cardiovascular: Negative for chest pain, palpitations and leg swelling.  Gastrointestinal: Negative for abdominal distention, blood in stool, diarrhea and nausea.  Genitourinary: Negative for frequency and hematuria.  Musculoskeletal: Positive for back pain and gait problem.  Negative for joint swelling and neck pain.  Skin: Negative for rash.  Neurological: Negative for dizziness, tremors, speech difficulty and weakness.  Psychiatric/Behavioral: Negative for agitation, dysphoric mood and sleep disturbance. The patient is not nervous/anxious.     Objective:  BP (!) 146/78 (BP Location: Left Arm, Patient Position: Sitting, Cuff Size: Normal)   Pulse 75   Temp 98.1 F (36.7 C) (Oral)   Ht 6' (1.829 m)   Wt 173 lb (78.5 kg)   SpO2 99%   BMI 23.46 kg/m   BP Readings from Last 3 Encounters:  12/20/17 (!) 146/78  11/23/17 (!) 175/93  09/19/17 136/72    Wt Readings from Last 3 Encounters:  12/20/17 173 lb (78.5 kg)  11/23/17 173 lb 3.2 oz (78.6 kg)  09/19/17 179 lb (81.2 kg)    Physical Exam  Constitutional: He is oriented to person, place, and time. He appears well-developed. No distress.  NAD  HENT:  Mouth/Throat: Oropharynx is clear and moist.  Eyes: Pupils are equal, round, and reactive to light. Conjunctivae are normal.  Neck: Normal range of motion. No JVD present. No thyromegaly present.  Cardiovascular: Normal rate, regular rhythm, normal heart sounds and intact distal pulses. Exam reveals no gallop and no friction rub.  No murmur heard. Pulmonary/Chest: Effort normal and breath sounds normal. No respiratory distress. He has no wheezes. He has no rales. He exhibits no tenderness.  Abdominal: Soft. Bowel sounds are normal. He exhibits no distension and no mass. There is no tenderness. There is no rebound and no guarding.  Musculoskeletal: Normal range of motion. He exhibits tenderness. He exhibits no edema.  Lymphadenopathy:    He has no cervical adenopathy.  Neurological: He is alert and oriented to person, place, and time. He has normal reflexes. No cranial nerve deficit. He exhibits normal muscle tone. He displays a negative Romberg sign. Coordination abnormal. Gait normal.  Skin: Skin is warm and dry. No rash noted.  Psychiatric: He has a  normal mood and affect. His behavior is normal. Judgment and thought content normal.  LS tender Weak LEs  Lab Results  Component Value Date   WBC 5.9 12/20/2017   HGB 17.2 (H) 12/20/2017   HCT 50.2 12/20/2017   PLT 215.0 12/20/2017   GLUCOSE 98 12/20/2017   CHOL 199 02/25/2014   TRIG 239 (H) 02/25/2014   HDL 43 02/25/2014   LDLDIRECT 142.3 04/12/2010   LDLCALC 108 (H) 02/25/2014   ALT 19 12/20/2017   AST 20 12/20/2017   NA 139 12/20/2017   K 5.2 (H) 12/20/2017   CL 102 12/20/2017   CREATININE 0.83 12/20/2017   BUN 13 12/20/2017   CO2 31 12/20/2017   TSH 0.42 06/19/2017   PSA 3.22 06/19/2017   HGBA1C 6.7 (H) 12/20/2017   MICROALBUR <0.7 06/19/2017    Ct Chest W Contrast  Result Date: 05/24/2017 CLINICAL DATA:  Follow-up colon cancer, chemotherapy ongoing EXAM: CT CHEST, ABDOMEN, AND PELVIS WITH CONTRAST TECHNIQUE: Multidetector CT imaging of the chest, abdomen and pelvis was performed following the standard protocol during bolus administration of intravenous contrast. CONTRAST:  118mL ISOVUE-300 IOPAMIDOL (ISOVUE-300) INJECTION 61% COMPARISON:  05/17/2016 FINDINGS: CT CHEST FINDINGS Cardiovascular: Heart is normal in size.  No pericardial effusion. No evidence of thoracic aortic aneurysm. Mediastinum/Nodes: No suspicious mediastinal, hilar, or axillary lymphadenopathy. Visualized thyroid is notable for a stable 13 mm right thyroid nodule. Lungs/Pleura: Stable 5 x 6 mm calcified granuloma in the right upper lobe (series 6/image 43). Two triangular subpleural lymph nodes in the right middle lobe measuring 4-5 mm (series 6/images 69 and 77). Additional 5 mm triangular subpleural lymph node in the right upper lobe (series 6/image 59). No focal consolidation. No pleural effusion or pneumothorax. Musculoskeletal: Degenerative changes of the thoracic spine. CT ABDOMEN PELVIS FINDINGS Hepatobiliary: Liver is within normal limits. Gallbladder is unremarkable. No intrahepatic or extrahepatic  ductal dilatation. Pancreas: Parenchymal calcifications, suggesting sequela of prior/ chronic pancreatitis. Spleen: Within normal limits. Adrenals/Urinary Tract: Adrenal glands are within normal limits. 7 mm anterior left lower pole renal cyst (series 7/ image 24). Right kidney is within normal limits. No hydronephrosis. Bladder is within normal limits. Stomach/Bowel: Stomach is within normal limits. No evidence of bowel obstruction. Status post right hemicolectomy with appendectomy. Mild left colonic diverticulosis, without evidence of diverticulitis. Vascular/Lymphatic: No evidence of abdominal aortic aneurysm. No suspicious abdominopelvic lymphadenopathy. Small upper abdominal lymph nodes measuring 70 mm short axis, within normal limits. Reproductive: Prostatomegaly. Other: No abdominopelvic ascites. Musculoskeletal: Postsurgical changes with laminectomy and ray cage fusion at L4-5 and L5-S1. IMPRESSION: Status post right hemicolectomy with appendectomy. No findings suspicious for recurrent or metastatic disease. Additional stable ancillary findings as above. Electronically Signed   By: Julian Hy M.D.   On: 05/24/2017 14:30   Ct Abdomen Pelvis W Contrast  Result Date: 05/24/2017 CLINICAL DATA:  Follow-up colon cancer, chemotherapy ongoing EXAM: CT CHEST, ABDOMEN, AND PELVIS WITH CONTRAST TECHNIQUE: Multidetector  CT imaging of the chest, abdomen and pelvis was performed following the standard protocol during bolus administration of intravenous contrast. CONTRAST:  177mL ISOVUE-300 IOPAMIDOL (ISOVUE-300) INJECTION 61% COMPARISON:  05/17/2016 FINDINGS: CT CHEST FINDINGS Cardiovascular: Heart is normal in size.  No pericardial effusion. No evidence of thoracic aortic aneurysm. Mediastinum/Nodes: No suspicious mediastinal, hilar, or axillary lymphadenopathy. Visualized thyroid is notable for a stable 13 mm right thyroid nodule. Lungs/Pleura: Stable 5 x 6 mm calcified granuloma in the right upper lobe  (series 6/image 43). Two triangular subpleural lymph nodes in the right middle lobe measuring 4-5 mm (series 6/images 69 and 77). Additional 5 mm triangular subpleural lymph node in the right upper lobe (series 6/image 59). No focal consolidation. No pleural effusion or pneumothorax. Musculoskeletal: Degenerative changes of the thoracic spine. CT ABDOMEN PELVIS FINDINGS Hepatobiliary: Liver is within normal limits. Gallbladder is unremarkable. No intrahepatic or extrahepatic ductal dilatation. Pancreas: Parenchymal calcifications, suggesting sequela of prior/ chronic pancreatitis. Spleen: Within normal limits. Adrenals/Urinary Tract: Adrenal glands are within normal limits. 7 mm anterior left lower pole renal cyst (series 7/ image 24). Right kidney is within normal limits. No hydronephrosis. Bladder is within normal limits. Stomach/Bowel: Stomach is within normal limits. No evidence of bowel obstruction. Status post right hemicolectomy with appendectomy. Mild left colonic diverticulosis, without evidence of diverticulitis. Vascular/Lymphatic: No evidence of abdominal aortic aneurysm. No suspicious abdominopelvic lymphadenopathy. Small upper abdominal lymph nodes measuring 70 mm short axis, within normal limits. Reproductive: Prostatomegaly. Other: No abdominopelvic ascites. Musculoskeletal: Postsurgical changes with laminectomy and ray cage fusion at L4-5 and L5-S1. IMPRESSION: Status post right hemicolectomy with appendectomy. No findings suspicious for recurrent or metastatic disease. Additional stable ancillary findings as above. Electronically Signed   By: Julian Hy M.D.   On: 05/24/2017 14:30    Assessment & Plan:   There are no diagnoses linked to this encounter.   No orders of the defined types were placed in this encounter.    Follow-up: No follow-ups on file.  Walker Kehr, MD

## 2018-03-19 ENCOUNTER — Other Ambulatory Visit: Payer: Self-pay | Admitting: Internal Medicine

## 2018-03-23 ENCOUNTER — Ambulatory Visit: Payer: Medicare Other | Admitting: Internal Medicine

## 2018-03-23 ENCOUNTER — Encounter: Payer: Self-pay | Admitting: Internal Medicine

## 2018-03-23 ENCOUNTER — Other Ambulatory Visit (INDEPENDENT_AMBULATORY_CARE_PROVIDER_SITE_OTHER): Payer: Medicare Other

## 2018-03-23 DIAGNOSIS — K219 Gastro-esophageal reflux disease without esophagitis: Secondary | ICD-10-CM

## 2018-03-23 DIAGNOSIS — E119 Type 2 diabetes mellitus without complications: Secondary | ICD-10-CM

## 2018-03-23 DIAGNOSIS — E1142 Type 2 diabetes mellitus with diabetic polyneuropathy: Secondary | ICD-10-CM

## 2018-03-23 DIAGNOSIS — M5440 Lumbago with sciatica, unspecified side: Secondary | ICD-10-CM

## 2018-03-23 DIAGNOSIS — C182 Malignant neoplasm of ascending colon: Secondary | ICD-10-CM

## 2018-03-23 DIAGNOSIS — G8929 Other chronic pain: Secondary | ICD-10-CM

## 2018-03-23 LAB — CBC WITH DIFFERENTIAL/PLATELET
Basophils Absolute: 0 10*3/uL (ref 0.0–0.1)
Basophils Relative: 0.8 % (ref 0.0–3.0)
EOS PCT: 1.2 % (ref 0.0–5.0)
Eosinophils Absolute: 0.1 10*3/uL (ref 0.0–0.7)
HEMATOCRIT: 48.3 % (ref 39.0–52.0)
HEMOGLOBIN: 16.7 g/dL (ref 13.0–17.0)
LYMPHS PCT: 29.4 % (ref 12.0–46.0)
Lymphs Abs: 1.5 10*3/uL (ref 0.7–4.0)
MCHC: 34.5 g/dL (ref 30.0–36.0)
MCV: 94.3 fl (ref 78.0–100.0)
MONOS PCT: 13.6 % — AB (ref 3.0–12.0)
Monocytes Absolute: 0.7 10*3/uL (ref 0.1–1.0)
NEUTROS ABS: 2.9 10*3/uL (ref 1.4–7.7)
Neutrophils Relative %: 55 % (ref 43.0–77.0)
Platelets: 206 10*3/uL (ref 150.0–400.0)
RBC: 5.13 Mil/uL (ref 4.22–5.81)
RDW: 13.1 % (ref 11.5–15.5)
WBC: 5.2 10*3/uL (ref 4.0–10.5)

## 2018-03-23 LAB — BASIC METABOLIC PANEL
BUN: 13 mg/dL (ref 6–23)
CO2: 32 meq/L (ref 19–32)
Calcium: 9.6 mg/dL (ref 8.4–10.5)
Chloride: 101 mEq/L (ref 96–112)
Creatinine, Ser: 0.9 mg/dL (ref 0.40–1.50)
GFR: 87.45 mL/min (ref >60.00)
Glucose, Bld: 176 mg/dL — ABNORMAL HIGH (ref 70–99)
POTASSIUM: 5 meq/L (ref 3.5–5.1)
Sodium: 137 mEq/L (ref 135–145)

## 2018-03-23 LAB — HEPATIC FUNCTION PANEL
ALBUMIN: 4.3 g/dL (ref 3.5–5.2)
ALT: 27 U/L (ref 0–53)
AST: 22 U/L (ref 0–37)
Alkaline Phosphatase: 72 U/L (ref 39–117)
Bilirubin, Direct: 0.1 mg/dL (ref 0.0–0.3)
TOTAL PROTEIN: 7.6 g/dL (ref 6.0–8.3)
Total Bilirubin: 0.9 mg/dL (ref 0.2–1.2)

## 2018-03-23 LAB — HEMOGLOBIN A1C: HEMOGLOBIN A1C: 7.5 % — AB (ref 4.6–6.5)

## 2018-03-23 MED ORDER — METHADONE HCL 10 MG PO TABS: 20.0000 mg | ORAL_TABLET | Freq: Four times a day (QID) | ORAL | 0 refills | Status: DC | PRN

## 2018-03-23 MED ORDER — EMPAGLIFLOZIN 25 MG PO TABS: 25.0000 mg | ORAL_TABLET | Freq: Every day | ORAL | 3 refills | Status: DC

## 2018-03-23 NOTE — Assessment & Plan Note (Addendum)
Glimeperide, Jardiance - will increase to 25 mg/d

## 2018-03-23 NOTE — Progress Notes (Signed)
Subjective:  Patient ID: Gabriel Erickson, male    DOB: 08-11-1940  Age: 77 y.o. MRN: [id number]  CC: No chief complaint on file.   HPI Gabriel Erickson presents for chronic pain, DM, GERD f/u C/o side effects - jardiance - white in the mouth  Outpatient Medications Prior to Visit  Medication Sig Dispense Refill  . ACCU-CHEK SOFTCLIX LANCETS lancets by Other route 2 (two) times daily. Use as instructed     . b complex vitamins tablet Take 1 tablet by mouth daily.      . Cholecalciferol 1000 UNITS tablet Take 1,000 Units by mouth daily.      . diazepam (VALIUM) 5 MG tablet Take 1 tablet (5 mg total) by mouth 2 (two) times daily as needed for muscle spasms (spasms). For cramps or insomnia 60 tablet 3  . empagliflozin (JARDIANCE) 10 MG TABS tablet Take 10 mg by mouth daily. 30 tablet 11  . fluticasone (FLONASE) 50 MCG/ACT nasal spray Place 2 sprays into the nose daily. 16 g 11  . glimepiride (AMARYL) 4 MG tablet TAKE 1 TABLET (4 MG TOTAL) BY MOUTH 2 (TWO) TIMES DAILY. 180 tablet 1  . methadone (DOLOPHINE) 10 MG tablet Take 2 tablets (20 mg total) by mouth 4 (four) times daily as needed for severe pain. For pain - FILL ON OR AFTER 02/24/2018 240 tablet 0  . ONE TOUCH ULTRA TEST test strip USE TO CHECK BLOOD SUGAR  TWO TIMES DAILY 200 each 3  . ranitidine (ZANTAC) 75 MG tablet Take 75 mg by mouth 2 (two) times daily.     No facility-administered medications prior to visit.     ROS: Review of Systems  Constitutional: Negative for appetite change, fatigue and unexpected weight change.  HENT: Negative for congestion, nosebleeds, sneezing, sore throat and trouble swallowing.   Eyes: Negative for itching and visual disturbance.  Respiratory: Negative for cough.   Cardiovascular: Negative for chest pain, palpitations and leg swelling.  Gastrointestinal: Negative for abdominal distention, blood in stool, diarrhea and nausea.  Genitourinary: Negative for frequency and hematuria.  Musculoskeletal:  Positive for back pain and gait problem. Negative for joint swelling and neck pain.  Skin: Negative for rash.  Neurological: Negative for dizziness, tremors, speech difficulty and weakness.  Psychiatric/Behavioral: Negative for agitation, dysphoric mood, sleep disturbance and suicidal ideas. The patient is not nervous/anxious.     Objective:  BP (!) 142/82 (BP Location: Left Arm, Patient Position: Sitting, Cuff Size: Normal)   Pulse 86   Temp 98 F (36.7 C) (Oral)   Ht 6' (1.829 m)   Wt 177 lb (80.3 kg)   SpO2 97%   BMI 24.01 kg/m   BP Readings from Last 3 Encounters:  03/23/18 (!) 142/82  12/20/17 (!) 146/78  11/23/17 (!) 175/93    Wt Readings from Last 3 Encounters:  03/23/18 177 lb (80.3 kg)  12/20/17 173 lb (78.5 kg)  11/23/17 173 lb 3.2 oz (78.6 kg)    Physical Exam  Constitutional: He is oriented to person, place, and time. He appears well-developed. No distress.  NAD  HENT:  Mouth/Throat: Oropharynx is clear and moist.  Eyes: Pupils are equal, round, and reactive to light. Conjunctivae are normal.  Neck: Normal range of motion. No JVD present. No thyromegaly present.  Cardiovascular: Normal rate, regular rhythm, normal heart sounds and intact distal pulses. Exam reveals no gallop and no friction rub.  No murmur heard. Pulmonary/Chest: Effort normal and breath sounds normal. No respiratory distress. He  has no wheezes. He has no rales. He exhibits no tenderness.  Abdominal: Soft. Bowel sounds are normal. He exhibits no distension and no mass. There is no tenderness. There is no rebound and no guarding.  Musculoskeletal: Normal range of motion. He exhibits tenderness. He exhibits no edema.  Lymphadenopathy:    He has no cervical adenopathy.  Neurological: He is alert and oriented to person, place, and time. He has normal reflexes. No cranial nerve deficit. He exhibits normal muscle tone. He displays a negative Romberg sign. Coordination and gait normal.  Skin: Skin is  warm and dry. No rash noted.  Psychiatric: He has a normal mood and affect. His behavior is normal. Judgment and thought content normal.    Lab Results  Component Value Date   WBC 5.2 03/23/2018   HGB 16.7 03/23/2018   HCT 48.3 03/23/2018   PLT 206.0 03/23/2018   GLUCOSE 176 (H) 03/23/2018   CHOL 199 02/25/2014   TRIG 239 (H) 02/25/2014   HDL 43 02/25/2014   LDLDIRECT 142.3 04/12/2010   LDLCALC 108 (H) 02/25/2014   ALT 27 03/23/2018   AST 22 03/23/2018   NA 137 03/23/2018   K 5.0 03/23/2018   CL 101 03/23/2018   CREATININE 0.90 03/23/2018   BUN 13 03/23/2018   CO2 32 03/23/2018   TSH 0.42 06/19/2017   PSA 3.22 06/19/2017   HGBA1C 7.5 (H) 03/23/2018   MICROALBUR <0.7 06/19/2017    Ct Chest W Contrast  Result Date: 05/24/2017 CLINICAL DATA:  Follow-up colon cancer, chemotherapy ongoing EXAM: CT CHEST, ABDOMEN, AND PELVIS WITH CONTRAST TECHNIQUE: Multidetector CT imaging of the chest, abdomen and pelvis was performed following the standard protocol during bolus administration of intravenous contrast. CONTRAST:  190mL ISOVUE-300 IOPAMIDOL (ISOVUE-300) INJECTION 61% COMPARISON:  05/17/2016 FINDINGS: CT CHEST FINDINGS Cardiovascular: Heart is normal in size.  No pericardial effusion. No evidence of thoracic aortic aneurysm. Mediastinum/Nodes: No suspicious mediastinal, hilar, or axillary lymphadenopathy. Visualized thyroid is notable for a stable 13 mm right thyroid nodule. Lungs/Pleura: Stable 5 x 6 mm calcified granuloma in the right upper lobe (series 6/image 43). Two triangular subpleural lymph nodes in the right middle lobe measuring 4-5 mm (series 6/images 69 and 77). Additional 5 mm triangular subpleural lymph node in the right upper lobe (series 6/image 59). No focal consolidation. No pleural effusion or pneumothorax. Musculoskeletal: Degenerative changes of the thoracic spine. CT ABDOMEN PELVIS FINDINGS Hepatobiliary: Liver is within normal limits. Gallbladder is unremarkable. No  intrahepatic or extrahepatic ductal dilatation. Pancreas: Parenchymal calcifications, suggesting sequela of prior/ chronic pancreatitis. Spleen: Within normal limits. Adrenals/Urinary Tract: Adrenal glands are within normal limits. 7 mm anterior left lower pole renal cyst (series 7/ image 24). Right kidney is within normal limits. No hydronephrosis. Bladder is within normal limits. Stomach/Bowel: Stomach is within normal limits. No evidence of bowel obstruction. Status post right hemicolectomy with appendectomy. Mild left colonic diverticulosis, without evidence of diverticulitis. Vascular/Lymphatic: No evidence of abdominal aortic aneurysm. No suspicious abdominopelvic lymphadenopathy. Small upper abdominal lymph nodes measuring 70 mm short axis, within normal limits. Reproductive: Prostatomegaly. Other: No abdominopelvic ascites. Musculoskeletal: Postsurgical changes with laminectomy and ray cage fusion at L4-5 and L5-S1. IMPRESSION: Status post right hemicolectomy with appendectomy. No findings suspicious for recurrent or metastatic disease. Additional stable ancillary findings as above. Electronically Signed   By: Julian Hy M.D.   On: 05/24/2017 14:30   Ct Abdomen Pelvis W Contrast  Result Date: 05/24/2017 CLINICAL DATA:  Follow-up colon cancer, chemotherapy ongoing EXAM: CT  CHEST, ABDOMEN, AND PELVIS WITH CONTRAST TECHNIQUE: Multidetector CT imaging of the chest, abdomen and pelvis was performed following the standard protocol during bolus administration of intravenous contrast. CONTRAST:  122mL ISOVUE-300 IOPAMIDOL (ISOVUE-300) INJECTION 61% COMPARISON:  05/17/2016 FINDINGS: CT CHEST FINDINGS Cardiovascular: Heart is normal in size.  No pericardial effusion. No evidence of thoracic aortic aneurysm. Mediastinum/Nodes: No suspicious mediastinal, hilar, or axillary lymphadenopathy. Visualized thyroid is notable for a stable 13 mm right thyroid nodule. Lungs/Pleura: Stable 5 x 6 mm calcified granuloma  in the right upper lobe (series 6/image 43). Two triangular subpleural lymph nodes in the right middle lobe measuring 4-5 mm (series 6/images 69 and 77). Additional 5 mm triangular subpleural lymph node in the right upper lobe (series 6/image 59). No focal consolidation. No pleural effusion or pneumothorax. Musculoskeletal: Degenerative changes of the thoracic spine. CT ABDOMEN PELVIS FINDINGS Hepatobiliary: Liver is within normal limits. Gallbladder is unremarkable. No intrahepatic or extrahepatic ductal dilatation. Pancreas: Parenchymal calcifications, suggesting sequela of prior/ chronic pancreatitis. Spleen: Within normal limits. Adrenals/Urinary Tract: Adrenal glands are within normal limits. 7 mm anterior left lower pole renal cyst (series 7/ image 24). Right kidney is within normal limits. No hydronephrosis. Bladder is within normal limits. Stomach/Bowel: Stomach is within normal limits. No evidence of bowel obstruction. Status post right hemicolectomy with appendectomy. Mild left colonic diverticulosis, without evidence of diverticulitis. Vascular/Lymphatic: No evidence of abdominal aortic aneurysm. No suspicious abdominopelvic lymphadenopathy. Small upper abdominal lymph nodes measuring 70 mm short axis, within normal limits. Reproductive: Prostatomegaly. Other: No abdominopelvic ascites. Musculoskeletal: Postsurgical changes with laminectomy and ray cage fusion at L4-5 and L5-S1. IMPRESSION: Status post right hemicolectomy with appendectomy. No findings suspicious for recurrent or metastatic disease. Additional stable ancillary findings as above. Electronically Signed   By: Julian Hy M.D.   On: 05/24/2017 14:30    Assessment & Plan:   There are no diagnoses linked to this encounter.   No orders of the defined types were placed in this encounter.    Follow-up: No follow-ups on file.  Walker Kehr, MD

## 2018-03-23 NOTE — Assessment & Plan Note (Signed)
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 addiction risk, apnea etc) and complications (i.e. Somnolence, constipation and others) were explained to the patient and were aknowledged.  Diazepam Benzo use risk is discussed - taking very little 

## 2018-03-23 NOTE — Assessment & Plan Note (Signed)
F/u w/Dr Sherrill 

## 2018-03-23 NOTE — Assessment & Plan Note (Signed)
Ranitidine

## 2018-03-23 NOTE — Patient Instructions (Addendum)
Hydrate yourself well 

## 2018-05-03 ENCOUNTER — Other Ambulatory Visit: Payer: Self-pay | Admitting: Internal Medicine

## 2018-05-10 ENCOUNTER — Telehealth: Payer: Self-pay

## 2018-05-10 NOTE — Telephone Encounter (Signed)
Per 10/24 vm return calls. Patient requested lab time be changed to 8:15 am

## 2018-05-25 ENCOUNTER — Ambulatory Visit (HOSPITAL_COMMUNITY)
Admission: RE | Admit: 2018-05-25 | Discharge: 2018-05-25 | Disposition: A | Discharge status: Home / Self Care | Payer: Medicare Other | Source: Ambulatory Visit | Attending: Nurse Practitioner | Admitting: Nurse Practitioner

## 2018-05-25 ENCOUNTER — Other Ambulatory Visit: Payer: Medicare Other

## 2018-05-25 ENCOUNTER — Inpatient Hospital Stay: Payer: Medicare Other | Attending: Oncology

## 2018-05-25 DIAGNOSIS — E042 Nontoxic multinodular goiter: Secondary | ICD-10-CM | POA: Diagnosis not present

## 2018-05-25 DIAGNOSIS — K8689 Other specified diseases of pancreas: Secondary | ICD-10-CM | POA: Diagnosis not present

## 2018-05-25 DIAGNOSIS — C779 Secondary and unspecified malignant neoplasm of lymph node, unspecified: Secondary | ICD-10-CM | POA: Diagnosis not present

## 2018-05-25 DIAGNOSIS — C182 Malignant neoplasm of ascending colon: Secondary | ICD-10-CM | POA: Diagnosis present

## 2018-05-25 DIAGNOSIS — Z9049 Acquired absence of other specified parts of digestive tract: Secondary | ICD-10-CM | POA: Insufficient documentation

## 2018-05-25 DIAGNOSIS — E119 Type 2 diabetes mellitus without complications: Secondary | ICD-10-CM | POA: Insufficient documentation

## 2018-05-25 DIAGNOSIS — R918 Other nonspecific abnormal finding of lung field: Secondary | ICD-10-CM | POA: Insufficient documentation

## 2018-05-25 DIAGNOSIS — Z800 Family history of malignant neoplasm of digestive organs: Secondary | ICD-10-CM | POA: Insufficient documentation

## 2018-05-25 DIAGNOSIS — Z8041 Family history of malignant neoplasm of ovary: Secondary | ICD-10-CM | POA: Insufficient documentation

## 2018-05-25 DIAGNOSIS — Z9221 Personal history of antineoplastic chemotherapy: Secondary | ICD-10-CM | POA: Diagnosis not present

## 2018-05-25 LAB — CMP (CANCER CENTER ONLY)
ALK PHOS: 83 U/L (ref 38–126)
ALT: 26 U/L (ref 0–44)
ANION GAP: 8 (ref 5–15)
AST: 24 U/L (ref 15–41)
Albumin: 4.1 g/dL (ref 3.5–5.0)
BUN: 13 mg/dL (ref 8–23)
CALCIUM: 9.8 mg/dL (ref 8.9–10.3)
CO2: 30 mmol/L (ref 22–32)
Chloride: 104 mmol/L (ref 98–111)
Creatinine: 0.9 mg/dL (ref 0.61–1.24)
GFR, Estimated: >60 mL/min (ref >60)
Glucose, Bld: 119 mg/dL — ABNORMAL HIGH (ref 70–99)
POTASSIUM: 4.8 mmol/L (ref 3.5–5.1)
SODIUM: 142 mmol/L (ref 135–145)
TOTAL PROTEIN: 7.9 g/dL (ref 6.5–8.1)
Total Bilirubin: 0.8 mg/dL (ref 0.3–1.2)

## 2018-05-25 LAB — CEA (IN HOUSE-CHCC): CEA (CHCC-In House): <1 ng/mL (ref 0.00–5.00)

## 2018-05-25 MED ORDER — SODIUM CHLORIDE (PF) 0.9 % IJ SOLN
INTRAMUSCULAR | Status: AC
  Filled 2018-05-25: qty 50

## 2018-05-25 MED ORDER — IOHEXOL 300 MG/ML  SOLN
100.0000 mL | Freq: Once | INTRAMUSCULAR | Status: AC | PRN
  Administered 2018-05-25: 100 mL via INTRAVENOUS

## 2018-05-28 ENCOUNTER — Inpatient Hospital Stay: Payer: Medicare Other | Admitting: Oncology

## 2018-05-28 VITALS — BP 175/93 | HR 80 | Temp 97.8°F | Resp 18 | Ht 72.0 in | Wt 175.6 lb

## 2018-05-28 DIAGNOSIS — C182 Malignant neoplasm of ascending colon: Secondary | ICD-10-CM

## 2018-05-28 DIAGNOSIS — Z8041 Family history of malignant neoplasm of ovary: Secondary | ICD-10-CM

## 2018-05-28 DIAGNOSIS — E119 Type 2 diabetes mellitus without complications: Secondary | ICD-10-CM | POA: Diagnosis not present

## 2018-05-28 DIAGNOSIS — Z800 Family history of malignant neoplasm of digestive organs: Secondary | ICD-10-CM

## 2018-05-28 DIAGNOSIS — C779 Secondary and unspecified malignant neoplasm of lymph node, unspecified: Secondary | ICD-10-CM

## 2018-05-28 DIAGNOSIS — Z9221 Personal history of antineoplastic chemotherapy: Secondary | ICD-10-CM

## 2018-05-28 NOTE — Progress Notes (Signed)
[place] OFFICE PROGRESS NOTE   Diagnosis: Colon cancer  INTERVAL HISTORY:   Gabriel Erickson returns for a scheduled visit.  He feels well.  No complaint.  Objective:  Vital signs in last 24 hours:  Blood pressure (!) 175/93, pulse 80, temperature 97.8 F (36.6 C), temperature source Oral, resp. rate 18, height 6' (1.829 m), weight 175 lb 9.6 oz (79.7 kg), SpO2 100 %.    HEENT: Neck without mass Lymphatics: No cervical, supraclavicular, axillary, or inguinal nodes Resp: Lungs clear bilaterally Cardio: Regular rate and rhythm GI: No hepatosplenomegaly, no mass, nontender Vascular: No leg edema  Lab Results:  Lab Results  Component Value Date   WBC 5.2 03/23/2018   HGB 16.7 03/23/2018   HCT 48.3 03/23/2018   MCV 94.3 03/23/2018   PLT 206.0 03/23/2018   NEUTROABS 2.9 03/23/2018    CMP  Lab Results  Component Value Date   NA 142 05/25/2018   K 4.8 05/25/2018   CL 104 05/25/2018   CO2 30 05/25/2018   GLUCOSE 119 (H) 05/25/2018   BUN 13 05/25/2018   CREATININE 0.90 05/25/2018   CALCIUM 9.8 05/25/2018   PROT 7.9 05/25/2018   ALBUMIN 4.1 05/25/2018   AST 24 05/25/2018   ALT 26 05/25/2018   ALKPHOS 83 05/25/2018   BILITOT 0.8 05/25/2018   GFRNONAA >60 05/25/2018   GFRAA >60 05/25/2018    Lab Results  Component Value Date   CEA1 <1.00 05/25/2018     Imaging:  Ct Chest W Contrast  Result Date: 05/25/2018 CLINICAL DATA:  Follow-up colon cancer. EXAM: CT CHEST, ABDOMEN, AND PELVIS WITH CONTRAST TECHNIQUE: Multidetector CT imaging of the chest, abdomen and pelvis was performed following the standard protocol during bolus administration of intravenous contrast. CONTRAST:  182m OMNIPAQUE IOHEXOL 300 MG/ML  SOLN COMPARISON:  05/24/2017 FINDINGS: CT CHEST FINDINGS Cardiovascular: Normal heart size.  No pericardial effusion. Mediastinum/Nodes: Multinodular thyroid gland is again noted. Dominant nodule in the right lobe measures 2.2 cm, image 9/2. The  trachea appears patent and is midline. Normal appearance of the esophagus. No supraclavicular or axillary adenopathy. No mediastinal or hilar adenopathy. Lungs/Pleura: No pleural effusion. Stable calcified nodule in the right upper lobe measuring 6 mm, image 49/4. Unchanged. Small anterior right upper lobe lung nodule measuring 5 mm is stable, image 66/4. New right upper lobe lung nodule measures 2 mm, image 64/4. Right upper lobe perifissural nodule is stable measuring 5 mm, image 76/4. No additional pulmonary nodules identified. Musculoskeletal: No aggressive lytic or sclerotic bone lesions identified. CT ABDOMEN PELVIS FINDINGS Hepatobiliary: There are no suspicious liver lesions. The gallbladder appears normal. No biliary ductal dilatation. Pancreas: Scattered pancreatic calcifications are noted which may be the sequelae of chronic pancreatitis. No pancreatic inflammation, main duct dilatation or mass noted. Spleen: Spleen appears normal. Adrenals/Urinary Tract: The adrenal glands are unremarkable. Right kidney is normal. Small low-density structure within the inferior pole of left kidney measures 5 mm and is too small to characterize. Urinary bladder appears within normal limits. Stomach/Bowel: Stomach is normal. The small bowel loops have a normal caliber. Postoperative changes from right hemicolectomy identified. Enterocolonic anastomosis. No specific findings at the anastomosis to suggest local tumor recurrence or complication. No pathologic dilatation of the remaining colon. Distal colonic diverticulosis without acute inflammation. Vascular/Lymphatic: Normal appearance of the abdominal aorta. No enlarged retroperitoneal or mesenteric adenopathy. No enlarged pelvic or inguinal lymph nodes. Reproductive: Prostate is unremarkable. Other: No abdominal wall hernia or abnormality. No abdominopelvic ascites. Musculoskeletal: No  acute or significant osseous findings. IMPRESSION: 1. No significant interval change  compared with 05/24/2017. No findings highly concerning for recurrent or metastatic disease status post right hemicolectomy. 2. Previously noted calcified and noncalcified nodules are stable when compared with the previous exam. A new tiny nodule within the right upper lobe measures 2-3 mm, nonspecific. This warrants attention on follow-up imaging. 3. Calcifications within the pancreas which may reflect chronic pancreatitis. 4. Multinodular thyroid gland with dominant nodule in the right lobe. Consider further evaluation with thyroid ultrasound. If patient is clinically hyperthyroid, consider nuclear medicine thyroid uptake and scan. Electronically Signed   By: Kerby Moors M.D.   On: 05/25/2018 16:48   Ct Abdomen Pelvis W Contrast  Result Date: 05/25/2018 CLINICAL DATA:  Follow-up colon cancer. EXAM: CT CHEST, ABDOMEN, AND PELVIS WITH CONTRAST TECHNIQUE: Multidetector CT imaging of the chest, abdomen and pelvis was performed following the standard protocol during bolus administration of intravenous contrast. CONTRAST:  173m OMNIPAQUE IOHEXOL 300 MG/ML  SOLN COMPARISON:  05/24/2017 FINDINGS: CT CHEST FINDINGS Cardiovascular: Normal heart size.  No pericardial effusion. Mediastinum/Nodes: Multinodular thyroid gland is again noted. Dominant nodule in the right lobe measures 2.2 cm, image 9/2. The trachea appears patent and is midline. Normal appearance of the esophagus. No supraclavicular or axillary adenopathy. No mediastinal or hilar adenopathy. Lungs/Pleura: No pleural effusion. Stable calcified nodule in the right upper lobe measuring 6 mm, image 49/4. Unchanged. Small anterior right upper lobe lung nodule measuring 5 mm is stable, image 66/4. New right upper lobe lung nodule measures 2 mm, image 64/4. Right upper lobe perifissural nodule is stable measuring 5 mm, image 76/4. No additional pulmonary nodules identified. Musculoskeletal: No aggressive lytic or sclerotic bone lesions identified. CT ABDOMEN  PELVIS FINDINGS Hepatobiliary: There are no suspicious liver lesions. The gallbladder appears normal. No biliary ductal dilatation. Pancreas: Scattered pancreatic calcifications are noted which may be the sequelae of chronic pancreatitis. No pancreatic inflammation, main duct dilatation or mass noted. Spleen: Spleen appears normal. Adrenals/Urinary Tract: The adrenal glands are unremarkable. Right kidney is normal. Small low-density structure within the inferior pole of left kidney measures 5 mm and is too small to characterize. Urinary bladder appears within normal limits. Stomach/Bowel: Stomach is normal. The small bowel loops have a normal caliber. Postoperative changes from right hemicolectomy identified. Enterocolonic anastomosis. No specific findings at the anastomosis to suggest local tumor recurrence or complication. No pathologic dilatation of the remaining colon. Distal colonic diverticulosis without acute inflammation. Vascular/Lymphatic: Normal appearance of the abdominal aorta. No enlarged retroperitoneal or mesenteric adenopathy. No enlarged pelvic or inguinal lymph nodes. Reproductive: Prostate is unremarkable. Other: No abdominal wall hernia or abnormality. No abdominopelvic ascites. Musculoskeletal: No acute or significant osseous findings. IMPRESSION: 1. No significant interval change compared with 05/24/2017. No findings highly concerning for recurrent or metastatic disease status post right hemicolectomy. 2. Previously noted calcified and noncalcified nodules are stable when compared with the previous exam. A new tiny nodule within the right upper lobe measures 2-3 mm, nonspecific. This warrants attention on follow-up imaging. 3. Calcifications within the pancreas which may reflect chronic pancreatitis. 4. Multinodular thyroid gland with dominant nodule in the right lobe. Consider further evaluation with thyroid ultrasound. If patient is clinically hyperthyroid, consider nuclear medicine thyroid  uptake and scan. Electronically Signed   By: TKerby MoorsM.D.   On: 05/25/2018 16:48    Medications: I have reviewed the patient's current medications.   Assessment/Plan: 1. Adenocarcinoma of the ascending colon, stage IIIB (T3, N1b), status  post a partial colectomy 06/24/2015 ? 2 of 27 lymph nodes contained metastatic carcinoma, lymphovascular and perineural invasion present ? Microsatellite stable with no loss of mismatch repair protein expression ? Cycle 1 adjuvant Xeloda 07/29/2015 ? Cycle 2 adjuvant Xeloda 08/19/2015 ? Cycle 3 adjuvant Xeloda 09/09/2015 ? Cycle 4 adjuvant Xeloda 10/14/2015 (50% dose reduction due to hand-foot syndrome following cycle 3); cycle 4 placed on hold 10/22/2015 due to foot pain ? Cycle 5 adjuvant Xeloda 11/07/2015 (further dose reduced to 1000 mg every morning and 500 mg every afternoon) ? Cycle 6 adjuvant Xeloda 11/28/2015 (same dose of 1000 mg every morning and 500 mg every afternoon) ? Cycle 7 adjuvant Xeloda 12/19/2015 (no dose change) ? Cycle 8 adjuvant Xeloda 01/09/2016 ( no dose change) ? Surveillance CT scans 05/17/2016-no evidence of recurrent colon cancer ? Surveillance CT scans 05/24/2017-no evidence of recurrent colon cancer ? Surveillance colonoscopy 07/26/2017- polyp ascending colon, polyp transverse colon, polyp descending colon (TUBULAR ADENOMA (TWO FRAGMENTS) WITHOUT HIGH GRADE DYSPLASIA OR MALIGNANCY) ? CTs 05/25/2018-no evidence for recurrent disease, new 2-3 mm right upper lobe nodule-nonspecific  2. Diabetes  3. Hemachromatosis heterozygote  4. Family history of colon, ovarian, and biliary tract cancers  5. Indeterminate lung lesions on the chest CT 05/15/2015  6. Anemia, due to iron deficiency-resolved  7. Folliculitis-course of doxycycline prescribed 01/06/2016      Disposition: Gabriel Erickson remains in clinical remission from colon cancer.  The restaging CTs showed no evidence of recurrent disease.  There is  a new tiny right upper lobe nodule.  This is likely a benign finding.  We discussed the potential benefit of a follow-up chest CT in 6-9 months.  Gabriel Erickson is most comfortable with observation and not performing a follow-up chest CT.  He would like to continue clinical follow-up with Dr. Alain Marion.  He will continue colonoscopy surveillance with Dr. Havery Moros.  He is not scheduled for a follow-up appointment at the Cancer center.  I am available to see him in the chair as needed.  I recommend a yearly CEA for the next 2 years.  I will defer the decision on obtaining a thyroid ultrasound to Dr. Alain Marion.  15 minutes were spent with the patient today.  The majority of the time was used for counseling and coordination of care.  Betsy Coder, MD  05/28/2018  10:45 AM

## 2018-05-28 NOTE — Patient Instructions (Signed)
Please bring copy of Advanced Directive/Living Will at next visit

## 2018-06-25 ENCOUNTER — Other Ambulatory Visit (INDEPENDENT_AMBULATORY_CARE_PROVIDER_SITE_OTHER): Payer: Medicare Other

## 2018-06-25 ENCOUNTER — Ambulatory Visit: Payer: Medicare Other | Admitting: Internal Medicine

## 2018-06-25 ENCOUNTER — Encounter: Payer: Self-pay | Admitting: Internal Medicine

## 2018-06-25 DIAGNOSIS — K760 Fatty (change of) liver, not elsewhere classified: Secondary | ICD-10-CM

## 2018-06-25 DIAGNOSIS — E119 Type 2 diabetes mellitus without complications: Secondary | ICD-10-CM | POA: Diagnosis not present

## 2018-06-25 DIAGNOSIS — C182 Malignant neoplasm of ascending colon: Secondary | ICD-10-CM | POA: Diagnosis not present

## 2018-06-25 DIAGNOSIS — E1142 Type 2 diabetes mellitus with diabetic polyneuropathy: Secondary | ICD-10-CM

## 2018-06-25 DIAGNOSIS — K219 Gastro-esophageal reflux disease without esophagitis: Secondary | ICD-10-CM

## 2018-06-25 DIAGNOSIS — G8929 Other chronic pain: Secondary | ICD-10-CM

## 2018-06-25 DIAGNOSIS — M5440 Lumbago with sciatica, unspecified side: Secondary | ICD-10-CM | POA: Diagnosis not present

## 2018-06-25 LAB — CBC WITH DIFFERENTIAL/PLATELET
Basophils Absolute: 0.1 10*3/uL (ref 0.0–0.1)
Basophils Relative: 1.4 % (ref 0.0–3.0)
Eosinophils Absolute: 0 10*3/uL (ref 0.0–0.7)
Eosinophils Relative: 0.8 % (ref 0.0–5.0)
HCT: 48.9 % (ref 39.0–52.0)
HEMOGLOBIN: 16.7 g/dL (ref 13.0–17.0)
Lymphocytes Relative: 26.1 % (ref 12.0–46.0)
Lymphs Abs: 1.2 10*3/uL (ref 0.7–4.0)
MCHC: 34.2 g/dL (ref 30.0–36.0)
MCV: 95.6 fl (ref 78.0–100.0)
Monocytes Absolute: 0.6 10*3/uL (ref 0.1–1.0)
Monocytes Relative: 12.6 % — ABNORMAL HIGH (ref 3.0–12.0)
Neutro Abs: 2.7 10*3/uL (ref 1.4–7.7)
Neutrophils Relative %: 59.1 % (ref 43.0–77.0)
Platelets: 205 10*3/uL (ref 150.0–400.0)
RBC: 5.11 Mil/uL (ref 4.22–5.81)
RDW: 12.6 % (ref 11.5–15.5)
WBC: 4.6 10*3/uL (ref 4.0–10.5)

## 2018-06-25 LAB — BASIC METABOLIC PANEL
BUN: 14 mg/dL (ref 6–23)
CO2: 30 mEq/L (ref 19–32)
Calcium: 9.5 mg/dL (ref 8.4–10.5)
Chloride: 102 mEq/L (ref 96–112)
Creatinine, Ser: 0.81 mg/dL (ref 0.40–1.50)
GFR: 98.69 mL/min (ref >60.00)
Glucose, Bld: 134 mg/dL — ABNORMAL HIGH (ref 70–99)
Potassium: 5.1 mEq/L (ref 3.5–5.1)
Sodium: 138 mEq/L (ref 135–145)

## 2018-06-25 LAB — HEPATIC FUNCTION PANEL
ALT: 24 U/L (ref 0–53)
AST: 26 U/L (ref 0–37)
Albumin: 4.3 g/dL (ref 3.5–5.2)
Alkaline Phosphatase: 71 U/L (ref 39–117)
Bilirubin, Direct: 0.1 mg/dL (ref 0.0–0.3)
TOTAL PROTEIN: 7.7 g/dL (ref 6.0–8.3)
Total Bilirubin: 0.7 mg/dL (ref 0.2–1.2)

## 2018-06-25 LAB — HEMOGLOBIN A1C: Hgb A1c MFr Bld: 7.1 % — ABNORMAL HIGH (ref 4.6–6.5)

## 2018-06-25 MED ORDER — METHADONE HCL 10 MG PO TABS: 20.0000 mg | ORAL_TABLET | Freq: Four times a day (QID) | ORAL | 0 refills | Status: DC | PRN

## 2018-06-25 MED ORDER — FAMOTIDINE 40 MG PO TABS: 40.0000 mg | ORAL_TABLET | Freq: Every day | ORAL | 3 refills | Status: AC

## 2018-06-25 NOTE — Assessment & Plan Note (Signed)
Change rx to Pepcid

## 2018-06-25 NOTE — Assessment & Plan Note (Signed)
  On Glimeperide bid Jardiance 10 mg/d

## 2018-06-25 NOTE — Progress Notes (Signed)
Subjective:  Patient ID: Gabriel Erickson, male    DOB: 12-Apr-1941  Age: 77 y.o. MRN: [id number]  CC: No chief complaint on file.   HPI Romaldo Saville Gottschall presents for colon ca, DM, LBP f/u  Outpatient Medications Prior to Visit  Medication Sig Dispense Refill  . ACCU-CHEK SOFTCLIX LANCETS lancets by Other route 2 (two) times daily. Use as instructed     . b complex vitamins tablet Take 1 tablet by mouth daily.      . Cholecalciferol 1000 UNITS tablet Take 1,000 Units by mouth daily.      . diazepam (VALIUM) 5 MG tablet Take 1 tablet (5 mg total) by mouth 2 (two) times daily as needed for muscle spasms (spasms). For cramps or insomnia 60 tablet 3  . fluticasone (FLONASE) 50 MCG/ACT nasal spray Place 2 sprays into the nose daily. 16 g 11  . glimepiride (AMARYL) 4 MG tablet TAKE 1 TABLET BY MOUTH TWICE DAILY. 180 tablet 0  . JARDIANCE 10 MG TABS tablet TK 1 T PO D  5  . methadone (DOLOPHINE) 10 MG tablet Take 2 tablets (20 mg total) by mouth 4 (four) times daily as needed for severe pain. For pain M54.5 - FILL ON OR AFTER 05/27/2018 240 tablet 0  . ONE TOUCH ULTRA TEST test strip USE TO CHECK BLOOD SUGAR  TWO TIMES DAILY 200 each 3  . ranitidine (ZANTAC) 75 MG tablet Take 75 mg by mouth 2 (two) times daily.     No facility-administered medications prior to visit.     ROS: Review of Systems  Constitutional: Negative for appetite change, fatigue and unexpected weight change.  HENT: Negative for congestion, nosebleeds, sneezing, sore throat and trouble swallowing.   Eyes: Negative for itching and visual disturbance.  Respiratory: Negative for cough.   Cardiovascular: Negative for chest pain, palpitations and leg swelling.  Gastrointestinal: Negative for abdominal distention, blood in stool, diarrhea and nausea.  Genitourinary: Negative for frequency and hematuria.  Musculoskeletal: Positive for arthralgias, back pain and gait problem. Negative for joint swelling, neck pain and neck stiffness.    Skin: Negative for rash.  Neurological: Negative for dizziness, tremors, speech difficulty and weakness.  Psychiatric/Behavioral: Negative for agitation, dysphoric mood, sleep disturbance and suicidal ideas. The patient is not nervous/anxious.     Objective:  BP (!) 164/82 (BP Location: Left Arm, Patient Position: Sitting, Cuff Size: Normal)   Pulse 88   Temp 98 F (36.7 C) (Oral)   Ht 6' (1.829 m)   Wt 178 lb (80.7 kg)   SpO2 96%   BMI 24.14 kg/m   BP Readings from Last 3 Encounters:  06/25/18 (!) 164/82  05/28/18 (!) 175/93  03/23/18 (!) 142/82    Wt Readings from Last 3 Encounters:  06/25/18 178 lb (80.7 kg)  05/28/18 175 lb 9.6 oz (79.7 kg)  03/23/18 177 lb (80.3 kg)    Physical Exam  Constitutional: He is oriented to person, place, and time. He appears well-developed. No distress.  NAD  HENT:  Mouth/Throat: Oropharynx is clear and moist.  Eyes: Pupils are equal, round, and reactive to light. Conjunctivae are normal.  Neck: Normal range of motion. No JVD present. No thyromegaly present.  Cardiovascular: Normal rate, regular rhythm, normal heart sounds and intact distal pulses. Exam reveals no gallop and no friction rub.  No murmur heard. Pulmonary/Chest: Effort normal and breath sounds normal. No respiratory distress. He has no wheezes. He has no rales. He exhibits no tenderness.  Abdominal:  Soft. Bowel sounds are normal. He exhibits no distension and no mass. There is no tenderness. There is no rebound and no guarding.  Musculoskeletal: Normal range of motion. He exhibits tenderness. He exhibits no edema.  Lymphadenopathy:    He has no cervical adenopathy.  Neurological: He is alert and oriented to person, place, and time. He has normal reflexes. No cranial nerve deficit. He exhibits normal muscle tone. He displays a negative Romberg sign. Coordination abnormal. Gait normal.  Skin: Skin is warm and dry. No rash noted.  Psychiatric: He has a normal mood and affect.  His behavior is normal. Judgment and thought content normal.  LS spine w/pain  Lab Results  Component Value Date   WBC 4.6 06/25/2018   HGB 16.7 06/25/2018   HCT 48.9 06/25/2018   PLT 205.0 06/25/2018   GLUCOSE 134 (H) 06/25/2018   CHOL 199 02/25/2014   TRIG 239 (H) 02/25/2014   HDL 43 02/25/2014   LDLDIRECT 142.3 04/12/2010   LDLCALC 108 (H) 02/25/2014   ALT 24 06/25/2018   AST 26 06/25/2018   NA 138 06/25/2018   K 5.1 06/25/2018   CL 102 06/25/2018   CREATININE 0.81 06/25/2018   BUN 14 06/25/2018   CO2 30 06/25/2018   TSH 0.42 06/19/2017   PSA 3.22 06/19/2017   HGBA1C 7.1 (H) 06/25/2018   MICROALBUR <0.7 06/19/2017    Ct Chest W Contrast  Result Date: 05/25/2018 CLINICAL DATA:  Follow-up colon cancer. EXAM: CT CHEST, ABDOMEN, AND PELVIS WITH CONTRAST TECHNIQUE: Multidetector CT imaging of the chest, abdomen and pelvis was performed following the standard protocol during bolus administration of intravenous contrast. CONTRAST:  119mL OMNIPAQUE IOHEXOL 300 MG/ML  SOLN COMPARISON:  05/24/2017 FINDINGS: CT CHEST FINDINGS Cardiovascular: Normal heart size.  No pericardial effusion. Mediastinum/Nodes: Multinodular thyroid gland is again noted. Dominant nodule in the right lobe measures 2.2 cm, image 9/2. The trachea appears patent and is midline. Normal appearance of the esophagus. No supraclavicular or axillary adenopathy. No mediastinal or hilar adenopathy. Lungs/Pleura: No pleural effusion. Stable calcified nodule in the right upper lobe measuring 6 mm, image 49/4. Unchanged. Small anterior right upper lobe lung nodule measuring 5 mm is stable, image 66/4. New right upper lobe lung nodule measures 2 mm, image 64/4. Right upper lobe perifissural nodule is stable measuring 5 mm, image 76/4. No additional pulmonary nodules identified. Musculoskeletal: No aggressive lytic or sclerotic bone lesions identified. CT ABDOMEN PELVIS FINDINGS Hepatobiliary: There are no suspicious liver lesions.  The gallbladder appears normal. No biliary ductal dilatation. Pancreas: Scattered pancreatic calcifications are noted which may be the sequelae of chronic pancreatitis. No pancreatic inflammation, main duct dilatation or mass noted. Spleen: Spleen appears normal. Adrenals/Urinary Tract: The adrenal glands are unremarkable. Right kidney is normal. Small low-density structure within the inferior pole of left kidney measures 5 mm and is too small to characterize. Urinary bladder appears within normal limits. Stomach/Bowel: Stomach is normal. The small bowel loops have a normal caliber. Postoperative changes from right hemicolectomy identified. Enterocolonic anastomosis. No specific findings at the anastomosis to suggest local tumor recurrence or complication. No pathologic dilatation of the remaining colon. Distal colonic diverticulosis without acute inflammation. Vascular/Lymphatic: Normal appearance of the abdominal aorta. No enlarged retroperitoneal or mesenteric adenopathy. No enlarged pelvic or inguinal lymph nodes. Reproductive: Prostate is unremarkable. Other: No abdominal wall hernia or abnormality. No abdominopelvic ascites. Musculoskeletal: No acute or significant osseous findings. IMPRESSION: 1. No significant interval change compared with 05/24/2017. No findings highly concerning for recurrent  or metastatic disease status post right hemicolectomy. 2. Previously noted calcified and noncalcified nodules are stable when compared with the previous exam. A new tiny nodule within the right upper lobe measures 2-3 mm, nonspecific. This warrants attention on follow-up imaging. 3. Calcifications within the pancreas which may reflect chronic pancreatitis. 4. Multinodular thyroid gland with dominant nodule in the right lobe. Consider further evaluation with thyroid ultrasound. If patient is clinically hyperthyroid, consider nuclear medicine thyroid uptake and scan. Electronically Signed   By: Kerby Moors M.D.   On:  05/25/2018 16:48   Ct Abdomen Pelvis W Contrast  Result Date: 05/25/2018 CLINICAL DATA:  Follow-up colon cancer. EXAM: CT CHEST, ABDOMEN, AND PELVIS WITH CONTRAST TECHNIQUE: Multidetector CT imaging of the chest, abdomen and pelvis was performed following the standard protocol during bolus administration of intravenous contrast. CONTRAST:  128mL OMNIPAQUE IOHEXOL 300 MG/ML  SOLN COMPARISON:  05/24/2017 FINDINGS: CT CHEST FINDINGS Cardiovascular: Normal heart size.  No pericardial effusion. Mediastinum/Nodes: Multinodular thyroid gland is again noted. Dominant nodule in the right lobe measures 2.2 cm, image 9/2. The trachea appears patent and is midline. Normal appearance of the esophagus. No supraclavicular or axillary adenopathy. No mediastinal or hilar adenopathy. Lungs/Pleura: No pleural effusion. Stable calcified nodule in the right upper lobe measuring 6 mm, image 49/4. Unchanged. Small anterior right upper lobe lung nodule measuring 5 mm is stable, image 66/4. New right upper lobe lung nodule measures 2 mm, image 64/4. Right upper lobe perifissural nodule is stable measuring 5 mm, image 76/4. No additional pulmonary nodules identified. Musculoskeletal: No aggressive lytic or sclerotic bone lesions identified. CT ABDOMEN PELVIS FINDINGS Hepatobiliary: There are no suspicious liver lesions. The gallbladder appears normal. No biliary ductal dilatation. Pancreas: Scattered pancreatic calcifications are noted which may be the sequelae of chronic pancreatitis. No pancreatic inflammation, main duct dilatation or mass noted. Spleen: Spleen appears normal. Adrenals/Urinary Tract: The adrenal glands are unremarkable. Right kidney is normal. Small low-density structure within the inferior pole of left kidney measures 5 mm and is too small to characterize. Urinary bladder appears within normal limits. Stomach/Bowel: Stomach is normal. The small bowel loops have a normal caliber. Postoperative changes from right  hemicolectomy identified. Enterocolonic anastomosis. No specific findings at the anastomosis to suggest local tumor recurrence or complication. No pathologic dilatation of the remaining colon. Distal colonic diverticulosis without acute inflammation. Vascular/Lymphatic: Normal appearance of the abdominal aorta. No enlarged retroperitoneal or mesenteric adenopathy. No enlarged pelvic or inguinal lymph nodes. Reproductive: Prostate is unremarkable. Other: No abdominal wall hernia or abnormality. No abdominopelvic ascites. Musculoskeletal: No acute or significant osseous findings. IMPRESSION: 1. No significant interval change compared with 05/24/2017. No findings highly concerning for recurrent or metastatic disease status post right hemicolectomy. 2. Previously noted calcified and noncalcified nodules are stable when compared with the previous exam. A new tiny nodule within the right upper lobe measures 2-3 mm, nonspecific. This warrants attention on follow-up imaging. 3. Calcifications within the pancreas which may reflect chronic pancreatitis. 4. Multinodular thyroid gland with dominant nodule in the right lobe. Consider further evaluation with thyroid ultrasound. If patient is clinically hyperthyroid, consider nuclear medicine thyroid uptake and scan. Electronically Signed   By: Kerby Moors M.D.   On: 05/25/2018 16:48    Assessment & Plan:   There are no diagnoses linked to this encounter.   No orders of the defined types were placed in this encounter.    Follow-up: No follow-ups on file.  Walker Kehr, MD

## 2018-06-25 NOTE — Assessment & Plan Note (Signed)
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 addiction risk, apnea etc) and complications (i.e. Somnolence, constipation and others) were explained to the patient and were aknowledged.  Diazepam Benzo use risk is discussed - taking very little 

## 2018-06-25 NOTE — Assessment & Plan Note (Signed)
F/u w/Dr Armbruster Colon due in 2022

## 2018-06-25 NOTE — Assessment & Plan Note (Signed)
LFTs 

## 2018-08-03 ENCOUNTER — Other Ambulatory Visit: Payer: Self-pay | Admitting: Internal Medicine

## 2018-08-13 ENCOUNTER — Ambulatory Visit: Payer: Medicare Other | Admitting: Family

## 2018-08-13 ENCOUNTER — Encounter: Payer: Self-pay | Admitting: Family

## 2018-08-13 VITALS — BP 148/82 | HR 95 | Temp 98.2°F | Ht 72.0 in | Wt 175.1 lb

## 2018-08-13 DIAGNOSIS — L739 Follicular disorder, unspecified: Secondary | ICD-10-CM | POA: Diagnosis not present

## 2018-08-13 MED ORDER — DOXYCYCLINE HYCLATE 100 MG PO TABS: 100.0000 mg | ORAL_TABLET | Freq: Two times a day (BID) | ORAL | 0 refills | Status: DC

## 2018-08-13 NOTE — Progress Notes (Signed)
Gabriel Erickson is a 78 y.o. male with the following history as recorded in [employer]:  Patient Active Problem List   Diagnosis Date Noted  . Elevated BP without diagnosis of hypertension 06/13/2016  . Carotid bruit 06/13/2016  . Grief reaction 06/13/2016  . Folliculitis 16/01/3709  . Paresthesia 09/07/2015  . Encounter for chemotherapy management 07/28/2015  . Colon cancer, ascending ([place]) 06/24/2015  . Colon cancer ([place]) 06/03/2015  . Abnormal CT scan 04/01/2015  . Well adult exam 05/26/2014  . Herpes zoster 02/06/2012  . Parotitis 02/06/2012  . Poison sumac 12/31/2011  . Right otitis media 01/01/2011  . GRIEF REACTION 07/14/2010  . OTHER ACUTE SINUSITIS 05/29/2010  . HEMORRHOIDS 01/08/2010  . TMJ PAIN 09/24/2009  . Hepatic steatosis 08/13/2008  . IBS 08/11/2008  . ABNORMAL LIVER FUNCTION TESTS 08/06/2008  . DIVERTICULITIS 07/21/2008  . ERECTILE DYSFUNCTION 07/21/2008  . RASH AND OTHER NONSPECIFIC SKIN ERUPTION 07/21/2008  . Abdominal pain 07/21/2008  . PERIPHERAL NEUROPATHY 07/09/2007  . Hemochromatosis 05/07/2007  . OSTEOARTHRITIS 05/07/2007  . LOW BACK PAIN 05/07/2007  . Diabetes mellitus type 2, controlled ([place]) 02/16/2007  . GERD 02/16/2007  . DIVERTICULOSIS, COLON 02/16/2007    Current Outpatient Medications  Medication Sig Dispense Refill  . ACCU-CHEK SOFTCLIX LANCETS lancets by Other route 2 (two) times daily. Use as instructed     . b complex vitamins tablet Take 1 tablet by mouth daily.      . Cholecalciferol 1000 UNITS tablet Take 1,000 Units by mouth daily.      . diazepam (VALIUM) 5 MG tablet Take 1 tablet (5 mg total) by mouth 2 (two) times daily as needed for muscle spasms (spasms). For cramps or insomnia 60 tablet 3  . famotidine (PEPCID) 40 MG tablet Take 1 tablet (40 mg total) by mouth daily. 90 tablet 3  . fluticasone (FLONASE) 50 MCG/ACT nasal spray Place 2 sprays into the nose daily. 16 g 11  . glimepiride (AMARYL) 4 MG tablet TAKE 1 TABLET BY MOUTH  TWICE DAILY. 180 tablet 0  . JARDIANCE 10 MG TABS tablet TK 1 T PO D  5  . methadone (DOLOPHINE) 10 MG tablet Take 2 tablets (20 mg total) by mouth 4 (four) times daily as needed for severe pain. For pain M54.5 - FILL ON OR AFTER 08/27/2018 240 tablet 0  . ONE TOUCH ULTRA TEST test strip USE TO CHECK BLOOD SUGAR  TWO TIMES DAILY 200 each 3  . doxycycline (VIBRA-TABS) 100 MG tablet Take 1 tablet (100 mg total) by mouth 2 (two) times daily. 14 tablet 0   No current facility-administered medications for this visit.     Allergies: Penicillins; Ciprofloxacin; Metformin and related; Rosuvastatin; Actos [pioglitazone]; and Influenza vaccines  Past Medical History:  Diagnosis Date  . Allergy   . Cancer (HCC)    COLON CANCER  . Cataract    surgery - left eye only  . Colon cancer, ascending ([place]) 06/24/2015  . Constipation   . Diabetes mellitus   . Diverticulosis of colon   . Fatty liver   . Gallstones    Possible vs sludge  . GERD (gastroesophageal reflux disease)   . Hypogonadism male   . Hypotension   . LBP (low back pain)   . Osteoarthritis    lower back  . Peripheral neuropathy    occasional  . Seasonal allergies   . Urinary frequency     Past Surgical History:  Procedure Laterality Date  . APPENDECTOMY    .  CATARACT EXTRACTION Left   . COLON SURGERY    . COLONOSCOPY  04/2015   [facility]  . EYE SURGERY Left   . FOOT SURGERY  1966   Left  . HEMORRHOID SURGERY  2011   [facility]  . LAPAROSCOPIC PARTIAL COLECTOMY N/A 06/24/2015   Procedure: LAPAROSCOPIC PARTIAL COLECTOMY;  Surgeon: Erroll Luna, MD;  Location: [facility];  Service: General;  Laterality: N/A;  . LUMBAR LAMINECTOMY    . PARTIAL COLECTOMY  06/24/2015  . POLYPECTOMY    . TONSILLECTOMY      Family History  Problem Relation Age of Onset  . Heart disease Mother   . Cancer Sister        Liver, biliary duct & tube  . Hemochromatosis Sister   . Stroke Sister   . Colon polyps Sister   . Colon cancer Paternal  Uncle   . Esophageal cancer Neg Hx   . Stomach cancer Neg Hx   . Rectal cancer Neg Hx     Social History   Tobacco Use  . Smoking status: Never Smoker  . Smokeless tobacco: Never Used  Substance Use Topics  . Alcohol use: Yes    Alcohol/week: 0.0 standard drinks    Comment: rarely wine    Subjective:  Patient presents with concerns for recurrent folliculitis; has had in the past- responded to Doxycycline and would like prescription updated today; area localized over both forearms; no itching; did do some yard work recently- was cutting hedges and did get cuts on the forearm.    Objective:  Vitals:   08/13/18 1342  BP: (!) 148/82  Pulse: 95  Temp: 98.2 F (36.8 C)  TempSrc: Oral  SpO2: 96%  Weight: 175 lb 1.3 oz (79.4 kg)  Height: 6' (1.829 m)    General: Well developed, well nourished, in no acute distress  Skin : Warm and dry. Erythematous scabbed lesions noted on forearms Head: Normocephalic and atraumatic  Lungs: Respirations unlabored;  Neurologic: Alert and oriented; speech intact; face symmetrical; moves all extremities well; CNII-XII intact without focal deficit  Assessment:  1. Folliculitis     Plan:  Rx for Doxycycline 100 mg bid x 7 days; follow-up worse, no better.   No follow-ups on file.  No orders of the defined types were placed in this encounter.   Requested Prescriptions   Signed Prescriptions Disp Refills  . doxycycline (VIBRA-TABS) 100 MG tablet 14 tablet 0    Sig: Take 1 tablet (100 mg total) by mouth 2 (two) times daily.

## 2018-08-13 NOTE — Patient Instructions (Signed)

## 2018-09-25 ENCOUNTER — Ambulatory Visit: Payer: Medicare Other | Admitting: Internal Medicine

## 2018-09-28 ENCOUNTER — Encounter: Payer: Self-pay | Admitting: Internal Medicine

## 2018-09-28 ENCOUNTER — Other Ambulatory Visit (INDEPENDENT_AMBULATORY_CARE_PROVIDER_SITE_OTHER): Payer: Medicare Other

## 2018-09-28 ENCOUNTER — Other Ambulatory Visit: Payer: Self-pay

## 2018-09-28 ENCOUNTER — Ambulatory Visit: Payer: Medicare Other | Admitting: Internal Medicine

## 2018-09-28 DIAGNOSIS — E119 Type 2 diabetes mellitus without complications: Secondary | ICD-10-CM | POA: Diagnosis not present

## 2018-09-28 DIAGNOSIS — G8929 Other chronic pain: Secondary | ICD-10-CM | POA: Diagnosis not present

## 2018-09-28 DIAGNOSIS — M5440 Lumbago with sciatica, unspecified side: Secondary | ICD-10-CM | POA: Diagnosis not present

## 2018-09-28 DIAGNOSIS — E1142 Type 2 diabetes mellitus with diabetic polyneuropathy: Secondary | ICD-10-CM

## 2018-09-28 LAB — HEPATIC FUNCTION PANEL
ALT: 27 U/L (ref 0–53)
AST: 24 U/L (ref 0–37)
Albumin: 4.3 g/dL (ref 3.5–5.2)
Alkaline Phosphatase: 71 U/L (ref 39–117)
BILIRUBIN TOTAL: 0.7 mg/dL (ref 0.2–1.2)
Bilirubin, Direct: 0.1 mg/dL (ref 0.0–0.3)
Total Protein: 7.6 g/dL (ref 6.0–8.3)

## 2018-09-28 LAB — BASIC METABOLIC PANEL
BUN: 16 mg/dL (ref 6–23)
CO2: 31 mEq/L (ref 19–32)
Calcium: 9.5 mg/dL (ref 8.4–10.5)
Chloride: 101 mEq/L (ref 96–112)
Creatinine, Ser: 0.87 mg/dL (ref 0.40–1.50)
GFR: 85.44 mL/min (ref >60.00)
Glucose, Bld: 117 mg/dL — ABNORMAL HIGH (ref 70–99)
Potassium: 4.6 mEq/L (ref 3.5–5.1)
Sodium: 138 mEq/L (ref 135–145)

## 2018-09-28 LAB — HEMOGLOBIN A1C: Hgb A1c MFr Bld: 7.6 % — ABNORMAL HIGH (ref 4.6–6.5)

## 2018-09-28 LAB — CBC WITH DIFFERENTIAL/PLATELET
Basophils Absolute: 0 10*3/uL (ref 0.0–0.1)
Basophils Relative: 0.6 % (ref 0.0–3.0)
EOS PCT: 1.3 % (ref 0.0–5.0)
Eosinophils Absolute: 0.1 10*3/uL (ref 0.0–0.7)
HCT: 47.7 % (ref 39.0–52.0)
Hemoglobin: 16.3 g/dL (ref 13.0–17.0)
Lymphocytes Relative: 30.3 % (ref 12.0–46.0)
Lymphs Abs: 1.9 10*3/uL (ref 0.7–4.0)
MCHC: 34.2 g/dL (ref 30.0–36.0)
MCV: 95 fl (ref 78.0–100.0)
Monocytes Absolute: 0.8 10*3/uL (ref 0.1–1.0)
Monocytes Relative: 13.3 % — ABNORMAL HIGH (ref 3.0–12.0)
Neutro Abs: 3.4 10*3/uL (ref 1.4–7.7)
Neutrophils Relative %: 54.5 % (ref 43.0–77.0)
Platelets: 209 10*3/uL (ref 150.0–400.0)
RBC: 5.03 Mil/uL (ref 4.22–5.81)
RDW: 12.9 % (ref 11.5–15.5)
WBC: 6.1 10*3/uL (ref 4.0–10.5)

## 2018-09-28 MED ORDER — METHADONE HCL 10 MG PO TABS: 20.0000 mg | ORAL_TABLET | Freq: Three times a day (TID) | ORAL | 0 refills | Status: DC | PRN

## 2018-09-28 MED ORDER — NYSTATIN 100000 UNIT/GM EX CREA: 1.0000 "application " | TOPICAL_CREAM | Freq: Two times a day (BID) | CUTANEOUS | 1 refills | Status: DC

## 2018-09-28 NOTE — Progress Notes (Signed)
Subjective:  Patient ID: Gabriel Erickson, male    DOB: 19-Nov-1940  Age: 78 y.o. MRN: [id number]  CC: No chief complaint on file.   HPI Martavius Lusty Jenison presents for LBP, spasm, skin rash C/o Methadone hard to get - asking to reduce the number of pills F/u DM Outpatient Medications Prior to Visit  Medication Sig Dispense Refill  . ACCU-CHEK SOFTCLIX LANCETS lancets by Other route 2 (two) times daily. Use as instructed     . b complex vitamins tablet Take 1 tablet by mouth daily.      . Cholecalciferol 1000 UNITS tablet Take 1,000 Units by mouth daily.      . diazepam (VALIUM) 5 MG tablet Take 1 tablet (5 mg total) by mouth 2 (two) times daily as needed for muscle spasms (spasms). For cramps or insomnia 60 tablet 3  . famotidine (PEPCID) 40 MG tablet Take 1 tablet (40 mg total) by mouth daily. 90 tablet 3  . fluticasone (FLONASE) 50 MCG/ACT nasal spray Place 2 sprays into the nose daily. 16 g 11  . glimepiride (AMARYL) 4 MG tablet TAKE 1 TABLET BY MOUTH TWICE DAILY. 180 tablet 0  . JARDIANCE 10 MG TABS tablet TK 1 T PO D  5  . methadone (DOLOPHINE) 10 MG tablet Take 2 tablets (20 mg total) by mouth 4 (four) times daily as needed for severe pain. For pain M54.5 - FILL ON OR AFTER 08/27/2018 240 tablet 0  . ONE TOUCH ULTRA TEST test strip USE TO CHECK BLOOD SUGAR  TWO TIMES DAILY 200 each 3  . doxycycline (VIBRA-TABS) 100 MG tablet Take 1 tablet (100 mg total) by mouth 2 (two) times daily. (Patient not taking: Reported on 09/28/2018) 14 tablet 0   No facility-administered medications prior to visit.     ROS: Review of Systems  Constitutional: Negative for appetite change, fatigue and unexpected weight change.  HENT: Negative for congestion, nosebleeds, sneezing, sore throat and trouble swallowing.   Eyes: Negative for itching and visual disturbance.  Respiratory: Negative for cough.   Cardiovascular: Negative for chest pain, palpitations and leg swelling.  Gastrointestinal: Negative for  abdominal distention, blood in stool, diarrhea and nausea.  Genitourinary: Negative for frequency and hematuria.  Musculoskeletal: Positive for back pain. Negative for gait problem, joint swelling and neck pain.  Skin: Positive for rash.  Neurological: Negative for dizziness, tremors, speech difficulty and weakness.  Psychiatric/Behavioral: Negative for agitation, dysphoric mood and sleep disturbance. The patient is not nervous/anxious.     Objective:  BP (!) 146/78 (BP Location: Left Arm, Patient Position: Sitting, Cuff Size: Normal)   Pulse 81   Temp 97.8 F (36.6 C) (Oral)   Ht 6' (1.829 m)   Wt 176 lb (79.8 kg)   SpO2 97%   BMI 23.87 kg/m   BP Readings from Last 3 Encounters:  09/28/18 (!) 146/78  08/13/18 (!) 148/82  06/25/18 (!) 164/82    Wt Readings from Last 3 Encounters:  09/28/18 176 lb (79.8 kg)  08/13/18 175 lb 1.3 oz (79.4 kg)  06/25/18 178 lb (80.7 kg)    Physical Exam Constitutional:      General: He is not in acute distress.    Appearance: He is well-developed.     Comments: NAD  Eyes:     Conjunctiva/sclera: Conjunctivae normal.     Pupils: Pupils are equal, round, and reactive to light.  Neck:     Musculoskeletal: Normal range of motion.     Thyroid: No  thyromegaly.     Vascular: No JVD.  Cardiovascular:     Rate and Rhythm: Normal rate and regular rhythm.     Heart sounds: Normal heart sounds. No murmur. No friction rub. No gallop.   Pulmonary:     Effort: Pulmonary effort is normal. No respiratory distress.     Breath sounds: Normal breath sounds. No wheezing or rales.  Chest:     Chest wall: No tenderness.  Abdominal:     General: Bowel sounds are normal. There is no distension.     Palpations: Abdomen is soft. There is no mass.     Tenderness: There is no abdominal tenderness. There is no guarding or rebound.  Musculoskeletal: Normal range of motion.        General: Tenderness present.  Lymphadenopathy:     Cervical: No cervical  adenopathy.  Skin:    General: Skin is warm and dry.     Findings: Rash present.  Neurological:     Mental Status: He is alert and oriented to person, place, and time.     Cranial Nerves: No cranial nerve deficit.     Motor: No abnormal muscle tone.     Coordination: Coordination normal.     Gait: Gait normal.     Deep Tendon Reflexes: Reflexes are normal and symmetric.  Psychiatric:        Behavior: Behavior normal.        Thought Content: Thought content normal.        Judgment: Judgment normal.    Folliculitis R wrist LS w/pain   Lab Results  Component Value Date   WBC 6.1 09/28/2018   HGB 16.3 09/28/2018   HCT 47.7 09/28/2018   PLT 209.0 09/28/2018   GLUCOSE 117 (H) 09/28/2018   CHOL 199 02/25/2014   TRIG 239 (H) 02/25/2014   HDL 43 02/25/2014   LDLDIRECT 142.3 04/12/2010   LDLCALC 108 (H) 02/25/2014   ALT 27 09/28/2018   AST 24 09/28/2018   NA 138 09/28/2018   K 4.6 09/28/2018   CL 101 09/28/2018   CREATININE 0.87 09/28/2018   BUN 16 09/28/2018   CO2 31 09/28/2018   TSH 0.42 06/19/2017   PSA 3.22 06/19/2017   HGBA1C 7.6 (H) 09/28/2018   MICROALBUR <0.7 06/19/2017    Ct Chest W Contrast  Result Date: 05/25/2018 CLINICAL DATA:  Follow-up colon cancer. EXAM: CT CHEST, ABDOMEN, AND PELVIS WITH CONTRAST TECHNIQUE: Multidetector CT imaging of the chest, abdomen and pelvis was performed following the standard protocol during bolus administration of intravenous contrast. CONTRAST:  165mL OMNIPAQUE IOHEXOL 300 MG/ML  SOLN COMPARISON:  05/24/2017 FINDINGS: CT CHEST FINDINGS Cardiovascular: Normal heart size.  No pericardial effusion. Mediastinum/Nodes: Multinodular thyroid gland is again noted. Dominant nodule in the right lobe measures 2.2 cm, image 9/2. The trachea appears patent and is midline. Normal appearance of the esophagus. No supraclavicular or axillary adenopathy. No mediastinal or hilar adenopathy. Lungs/Pleura: No pleural effusion. Stable calcified nodule in  the right upper lobe measuring 6 mm, image 49/4. Unchanged. Small anterior right upper lobe lung nodule measuring 5 mm is stable, image 66/4. New right upper lobe lung nodule measures 2 mm, image 64/4. Right upper lobe perifissural nodule is stable measuring 5 mm, image 76/4. No additional pulmonary nodules identified. Musculoskeletal: No aggressive lytic or sclerotic bone lesions identified. CT ABDOMEN PELVIS FINDINGS Hepatobiliary: There are no suspicious liver lesions. The gallbladder appears normal. No biliary ductal dilatation. Pancreas: Scattered pancreatic calcifications are noted which may be  the sequelae of chronic pancreatitis. No pancreatic inflammation, main duct dilatation or mass noted. Spleen: Spleen appears normal. Adrenals/Urinary Tract: The adrenal glands are unremarkable. Right kidney is normal. Small low-density structure within the inferior pole of left kidney measures 5 mm and is too small to characterize. Urinary bladder appears within normal limits. Stomach/Bowel: Stomach is normal. The small bowel loops have a normal caliber. Postoperative changes from right hemicolectomy identified. Enterocolonic anastomosis. No specific findings at the anastomosis to suggest local tumor recurrence or complication. No pathologic dilatation of the remaining colon. Distal colonic diverticulosis without acute inflammation. Vascular/Lymphatic: Normal appearance of the abdominal aorta. No enlarged retroperitoneal or mesenteric adenopathy. No enlarged pelvic or inguinal lymph nodes. Reproductive: Prostate is unremarkable. Other: No abdominal wall hernia or abnormality. No abdominopelvic ascites. Musculoskeletal: No acute or significant osseous findings. IMPRESSION: 1. No significant interval change compared with 05/24/2017. No findings highly concerning for recurrent or metastatic disease status post right hemicolectomy. 2. Previously noted calcified and noncalcified nodules are stable when compared with the  previous exam. A new tiny nodule within the right upper lobe measures 2-3 mm, nonspecific. This warrants attention on follow-up imaging. 3. Calcifications within the pancreas which may reflect chronic pancreatitis. 4. Multinodular thyroid gland with dominant nodule in the right lobe. Consider further evaluation with thyroid ultrasound. If patient is clinically hyperthyroid, consider nuclear medicine thyroid uptake and scan. Electronically Signed   By: Kerby Moors M.D.   On: 05/25/2018 16:48   Ct Abdomen Pelvis W Contrast  Result Date: 05/25/2018 CLINICAL DATA:  Follow-up colon cancer. EXAM: CT CHEST, ABDOMEN, AND PELVIS WITH CONTRAST TECHNIQUE: Multidetector CT imaging of the chest, abdomen and pelvis was performed following the standard protocol during bolus administration of intravenous contrast. CONTRAST:  126mL OMNIPAQUE IOHEXOL 300 MG/ML  SOLN COMPARISON:  05/24/2017 FINDINGS: CT CHEST FINDINGS Cardiovascular: Normal heart size.  No pericardial effusion. Mediastinum/Nodes: Multinodular thyroid gland is again noted. Dominant nodule in the right lobe measures 2.2 cm, image 9/2. The trachea appears patent and is midline. Normal appearance of the esophagus. No supraclavicular or axillary adenopathy. No mediastinal or hilar adenopathy. Lungs/Pleura: No pleural effusion. Stable calcified nodule in the right upper lobe measuring 6 mm, image 49/4. Unchanged. Small anterior right upper lobe lung nodule measuring 5 mm is stable, image 66/4. New right upper lobe lung nodule measures 2 mm, image 64/4. Right upper lobe perifissural nodule is stable measuring 5 mm, image 76/4. No additional pulmonary nodules identified. Musculoskeletal: No aggressive lytic or sclerotic bone lesions identified. CT ABDOMEN PELVIS FINDINGS Hepatobiliary: There are no suspicious liver lesions. The gallbladder appears normal. No biliary ductal dilatation. Pancreas: Scattered pancreatic calcifications are noted which may be the sequelae of  chronic pancreatitis. No pancreatic inflammation, main duct dilatation or mass noted. Spleen: Spleen appears normal. Adrenals/Urinary Tract: The adrenal glands are unremarkable. Right kidney is normal. Small low-density structure within the inferior pole of left kidney measures 5 mm and is too small to characterize. Urinary bladder appears within normal limits. Stomach/Bowel: Stomach is normal. The small bowel loops have a normal caliber. Postoperative changes from right hemicolectomy identified. Enterocolonic anastomosis. No specific findings at the anastomosis to suggest local tumor recurrence or complication. No pathologic dilatation of the remaining colon. Distal colonic diverticulosis without acute inflammation. Vascular/Lymphatic: Normal appearance of the abdominal aorta. No enlarged retroperitoneal or mesenteric adenopathy. No enlarged pelvic or inguinal lymph nodes. Reproductive: Prostate is unremarkable. Other: No abdominal wall hernia or abnormality. No abdominopelvic ascites. Musculoskeletal: No acute or significant osseous  findings. IMPRESSION: 1. No significant interval change compared with 05/24/2017. No findings highly concerning for recurrent or metastatic disease status post right hemicolectomy. 2. Previously noted calcified and noncalcified nodules are stable when compared with the previous exam. A new tiny nodule within the right upper lobe measures 2-3 mm, nonspecific. This warrants attention on follow-up imaging. 3. Calcifications within the pancreas which may reflect chronic pancreatitis. 4. Multinodular thyroid gland with dominant nodule in the right lobe. Consider further evaluation with thyroid ultrasound. If patient is clinically hyperthyroid, consider nuclear medicine thyroid uptake and scan. Electronically Signed   By: Kerby Moors M.D.   On: 05/25/2018 16:48    Assessment & Plan:   There are no diagnoses linked to this encounter.   No orders of the defined types were placed in  this encounter.    Follow-up: No follow-ups on file.  Walker Kehr, MD

## 2018-09-28 NOTE — Assessment & Plan Note (Addendum)
Worse. Gabriel Erickson will exercise more... Labs

## 2018-09-28 NOTE — Assessment & Plan Note (Signed)
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 addiction risk, apnea etc) and complications (i.e. Somnolence, constipation and others) were explained to the patient and were aknowledged. Will be reducing the dose per Nico's request w/caution Diazepam Benzo use risk is discussed - taking very little

## 2018-09-28 NOTE — Assessment & Plan Note (Signed)
Labs

## 2018-09-30 ENCOUNTER — Other Ambulatory Visit: Payer: Self-pay | Admitting: Internal Medicine

## 2018-12-26 ENCOUNTER — Ambulatory Visit: Payer: Medicare Other | Admitting: Internal Medicine

## 2018-12-26 ENCOUNTER — Encounter: Payer: Self-pay | Admitting: Internal Medicine

## 2018-12-26 ENCOUNTER — Other Ambulatory Visit: Payer: Self-pay

## 2018-12-26 DIAGNOSIS — E1142 Type 2 diabetes mellitus with diabetic polyneuropathy: Secondary | ICD-10-CM | POA: Diagnosis not present

## 2018-12-26 DIAGNOSIS — C182 Malignant neoplasm of ascending colon: Secondary | ICD-10-CM | POA: Diagnosis not present

## 2018-12-26 DIAGNOSIS — K760 Fatty (change of) liver, not elsewhere classified: Secondary | ICD-10-CM

## 2018-12-26 DIAGNOSIS — M5440 Lumbago with sciatica, unspecified side: Secondary | ICD-10-CM

## 2018-12-26 DIAGNOSIS — G8929 Other chronic pain: Secondary | ICD-10-CM

## 2018-12-26 DIAGNOSIS — K219 Gastro-esophageal reflux disease without esophagitis: Secondary | ICD-10-CM | POA: Diagnosis not present

## 2018-12-26 MED ORDER — METHADONE HCL 10 MG PO TABS: 20.0000 mg | ORAL_TABLET | Freq: Three times a day (TID) | ORAL | 0 refills | Status: DC | PRN

## 2018-12-26 MED ORDER — EMPAGLIFLOZIN 10 MG PO TABS: 10.0000 mg | ORAL_TABLET | Freq: Every day | ORAL | 11 refills | Status: DC

## 2018-12-26 MED ORDER — GLIMEPIRIDE 4 MG PO TABS: 4.0000 mg | ORAL_TABLET | Freq: Two times a day (BID) | ORAL | 3 refills | Status: DC

## 2018-12-26 NOTE — Assessment & Plan Note (Signed)
Pepcid?

## 2018-12-26 NOTE — Assessment & Plan Note (Signed)
LFTs 

## 2018-12-26 NOTE — Assessment & Plan Note (Signed)
F/u w/Oncology 

## 2018-12-26 NOTE — Progress Notes (Signed)
Subjective:  Patient ID: Gabriel Erickson, male    DOB: 1941-03-03  Age: 78 y.o. MRN: [id number]  CC: No chief complaint on file.   HPI Gabriel Erickson presents for LBP, liver disease, DM f/u  Outpatient Medications Prior to Visit  Medication Sig Dispense Refill  . ACCU-CHEK SOFTCLIX LANCETS lancets by Other route 2 (two) times daily. Use as instructed     . b complex vitamins tablet Take 1 tablet by mouth daily.      . Cholecalciferol 1000 UNITS tablet Take 1,000 Units by mouth daily.      . diazepam (VALIUM) 5 MG tablet Take 1 tablet (5 mg total) by mouth 2 (two) times daily as needed for muscle spasms (spasms). For cramps or insomnia 60 tablet 3  . famotidine (PEPCID) 40 MG tablet Take 1 tablet (40 mg total) by mouth daily. 90 tablet 3  . fluticasone (FLONASE) 50 MCG/ACT nasal spray Place 2 sprays into the nose daily. 16 g 11  . glimepiride (AMARYL) 4 MG tablet TAKE 1 TABLET BY MOUTH TWICE DAILY. 180 tablet 0  . JARDIANCE 10 MG TABS tablet TAKE 1 TABLET BY MOUTH DAILY 30 tablet 11  . methadone (DOLOPHINE) 10 MG tablet Take 2 tablets (20 mg total) by mouth every 8 (eight) hours as needed for severe pain. For pain M54.5 - FILL ON OR AFTER 09/30/2018 180 tablet 0  . methadone (DOLOPHINE) 10 MG tablet Take 2 tablets (20 mg total) by mouth every 8 (eight) hours as needed for severe pain. 180 tablet 0  . methadone (DOLOPHINE) 10 MG tablet Take 2 tablets (20 mg total) by mouth every 8 (eight) hours as needed for severe pain. 180 tablet 0  . nystatin cream (MYCOSTATIN) Apply 1 application topically 2 (two) times daily. 30 g 1  . ONE TOUCH ULTRA TEST test strip USE TO CHECK BLOOD SUGAR  TWO TIMES DAILY 200 each 3   No facility-administered medications prior to visit.     ROS: Review of Systems  Constitutional: Negative for appetite change, fatigue and unexpected weight change.  HENT: Negative for congestion, nosebleeds, sneezing, sore throat and trouble swallowing.   Eyes: Negative for itching and  visual disturbance.  Respiratory: Negative for cough.   Cardiovascular: Negative for chest pain, palpitations and leg swelling.  Gastrointestinal: Negative for abdominal distention, blood in stool, diarrhea and nausea.  Genitourinary: Negative for frequency and hematuria.  Musculoskeletal: Positive for back pain, gait problem and myalgias. Negative for joint swelling, neck pain and neck stiffness.  Skin: Negative for rash.  Neurological: Negative for dizziness, tremors, speech difficulty and weakness.  Psychiatric/Behavioral: Negative for agitation, dysphoric mood and sleep disturbance. The patient is not nervous/anxious.     Objective:  BP 140/86 (BP Location: Left Arm, Patient Position: Sitting, Cuff Size: Normal)   Pulse 72   Temp 98 F (36.7 C) (Oral)   Ht 6' (1.829 m)   Wt 172 lb (78 kg)   SpO2 98%   BMI 23.33 kg/m   BP Readings from Last 3 Encounters:  12/26/18 140/86  09/28/18 (!) 146/78  08/13/18 (!) 148/82    Wt Readings from Last 3 Encounters:  12/26/18 172 lb (78 kg)  09/28/18 176 lb (79.8 kg)  08/13/18 175 lb 1.3 oz (79.4 kg)    Physical Exam Constitutional:      General: He is not in acute distress.    Appearance: He is well-developed.     Comments: NAD  Eyes:  Conjunctiva/sclera: Conjunctivae normal.     Pupils: Pupils are equal, round, and reactive to light.  Neck:     Musculoskeletal: Normal range of motion.     Thyroid: No thyromegaly.     Vascular: No JVD.  Cardiovascular:     Rate and Rhythm: Normal rate and regular rhythm.     Heart sounds: Normal heart sounds. No murmur. No friction rub. No gallop.   Pulmonary:     Effort: Pulmonary effort is normal. No respiratory distress.     Breath sounds: Normal breath sounds. No wheezing or rales.  Chest:     Chest wall: No tenderness.  Abdominal:     General: Bowel sounds are normal. There is no distension.     Palpations: Abdomen is soft. There is no mass.     Tenderness: There is no abdominal  tenderness. There is no guarding or rebound.  Musculoskeletal: Normal range of motion.        General: Tenderness present.  Lymphadenopathy:     Cervical: No cervical adenopathy.  Skin:    General: Skin is warm and dry.     Findings: No rash.  Neurological:     Mental Status: He is alert and oriented to person, place, and time.     Cranial Nerves: No cranial nerve deficit.     Motor: No abnormal muscle tone.     Coordination: Coordination normal.     Gait: Gait normal.     Deep Tendon Reflexes: Reflexes are normal and symmetric.  Psychiatric:        Behavior: Behavior normal.        Thought Content: Thought content normal.        Judgment: Judgment normal.   LS - pain w/ROM Weak legs  Lab Results  Component Value Date   WBC 6.1 09/28/2018   HGB 16.3 09/28/2018   HCT 47.7 09/28/2018   PLT 209.0 09/28/2018   GLUCOSE 117 (H) 09/28/2018   CHOL 199 02/25/2014   TRIG 239 (H) 02/25/2014   HDL 43 02/25/2014   LDLDIRECT 142.3 04/12/2010   LDLCALC 108 (H) 02/25/2014   ALT 27 09/28/2018   AST 24 09/28/2018   NA 138 09/28/2018   K 4.6 09/28/2018   CL 101 09/28/2018   CREATININE 0.87 09/28/2018   BUN 16 09/28/2018   CO2 31 09/28/2018   TSH 0.42 06/19/2017   PSA 3.22 06/19/2017   HGBA1C 7.6 (H) 09/28/2018   MICROALBUR <0.7 06/19/2017    Ct Chest W Contrast  Result Date: 05/25/2018 CLINICAL DATA:  Follow-up colon cancer. EXAM: CT CHEST, ABDOMEN, AND PELVIS WITH CONTRAST TECHNIQUE: Multidetector CT imaging of the chest, abdomen and pelvis was performed following the standard protocol during bolus administration of intravenous contrast. CONTRAST:  191mL OMNIPAQUE IOHEXOL 300 MG/ML  SOLN COMPARISON:  05/24/2017 FINDINGS: CT CHEST FINDINGS Cardiovascular: Normal heart size.  No pericardial effusion. Mediastinum/Nodes: Multinodular thyroid gland is again noted. Dominant nodule in the right lobe measures 2.2 cm, image 9/2. The trachea appears patent and is midline. Normal appearance of  the esophagus. No supraclavicular or axillary adenopathy. No mediastinal or hilar adenopathy. Lungs/Pleura: No pleural effusion. Stable calcified nodule in the right upper lobe measuring 6 mm, image 49/4. Unchanged. Small anterior right upper lobe lung nodule measuring 5 mm is stable, image 66/4. New right upper lobe lung nodule measures 2 mm, image 64/4. Right upper lobe perifissural nodule is stable measuring 5 mm, image 76/4. No additional pulmonary nodules identified. Musculoskeletal: No aggressive lytic or  sclerotic bone lesions identified. CT ABDOMEN PELVIS FINDINGS Hepatobiliary: There are no suspicious liver lesions. The gallbladder appears normal. No biliary ductal dilatation. Pancreas: Scattered pancreatic calcifications are noted which may be the sequelae of chronic pancreatitis. No pancreatic inflammation, main duct dilatation or mass noted. Spleen: Spleen appears normal. Adrenals/Urinary Tract: The adrenal glands are unremarkable. Right kidney is normal. Small low-density structure within the inferior pole of left kidney measures 5 mm and is too small to characterize. Urinary bladder appears within normal limits. Stomach/Bowel: Stomach is normal. The small bowel loops have a normal caliber. Postoperative changes from right hemicolectomy identified. Enterocolonic anastomosis. No specific findings at the anastomosis to suggest local tumor recurrence or complication. No pathologic dilatation of the remaining colon. Distal colonic diverticulosis without acute inflammation. Vascular/Lymphatic: Normal appearance of the abdominal aorta. No enlarged retroperitoneal or mesenteric adenopathy. No enlarged pelvic or inguinal lymph nodes. Reproductive: Prostate is unremarkable. Other: No abdominal wall hernia or abnormality. No abdominopelvic ascites. Musculoskeletal: No acute or significant osseous findings. IMPRESSION: 1. No significant interval change compared with 05/24/2017. No findings highly concerning for  recurrent or metastatic disease status post right hemicolectomy. 2. Previously noted calcified and noncalcified nodules are stable when compared with the previous exam. A new tiny nodule within the right upper lobe measures 2-3 mm, nonspecific. This warrants attention on follow-up imaging. 3. Calcifications within the pancreas which may reflect chronic pancreatitis. 4. Multinodular thyroid gland with dominant nodule in the right lobe. Consider further evaluation with thyroid ultrasound. If patient is clinically hyperthyroid, consider nuclear medicine thyroid uptake and scan. Electronically Signed   By: Kerby Moors M.D.   On: 05/25/2018 16:48   Ct Abdomen Pelvis W Contrast  Result Date: 05/25/2018 CLINICAL DATA:  Follow-up colon cancer. EXAM: CT CHEST, ABDOMEN, AND PELVIS WITH CONTRAST TECHNIQUE: Multidetector CT imaging of the chest, abdomen and pelvis was performed following the standard protocol during bolus administration of intravenous contrast. CONTRAST:  128mL OMNIPAQUE IOHEXOL 300 MG/ML  SOLN COMPARISON:  05/24/2017 FINDINGS: CT CHEST FINDINGS Cardiovascular: Normal heart size.  No pericardial effusion. Mediastinum/Nodes: Multinodular thyroid gland is again noted. Dominant nodule in the right lobe measures 2.2 cm, image 9/2. The trachea appears patent and is midline. Normal appearance of the esophagus. No supraclavicular or axillary adenopathy. No mediastinal or hilar adenopathy. Lungs/Pleura: No pleural effusion. Stable calcified nodule in the right upper lobe measuring 6 mm, image 49/4. Unchanged. Small anterior right upper lobe lung nodule measuring 5 mm is stable, image 66/4. New right upper lobe lung nodule measures 2 mm, image 64/4. Right upper lobe perifissural nodule is stable measuring 5 mm, image 76/4. No additional pulmonary nodules identified. Musculoskeletal: No aggressive lytic or sclerotic bone lesions identified. CT ABDOMEN PELVIS FINDINGS Hepatobiliary: There are no suspicious liver  lesions. The gallbladder appears normal. No biliary ductal dilatation. Pancreas: Scattered pancreatic calcifications are noted which may be the sequelae of chronic pancreatitis. No pancreatic inflammation, main duct dilatation or mass noted. Spleen: Spleen appears normal. Adrenals/Urinary Tract: The adrenal glands are unremarkable. Right kidney is normal. Small low-density structure within the inferior pole of left kidney measures 5 mm and is too small to characterize. Urinary bladder appears within normal limits. Stomach/Bowel: Stomach is normal. The small bowel loops have a normal caliber. Postoperative changes from right hemicolectomy identified. Enterocolonic anastomosis. No specific findings at the anastomosis to suggest local tumor recurrence or complication. No pathologic dilatation of the remaining colon. Distal colonic diverticulosis without acute inflammation. Vascular/Lymphatic: Normal appearance of the abdominal aorta. No  enlarged retroperitoneal or mesenteric adenopathy. No enlarged pelvic or inguinal lymph nodes. Reproductive: Prostate is unremarkable. Other: No abdominal wall hernia or abnormality. No abdominopelvic ascites. Musculoskeletal: No acute or significant osseous findings. IMPRESSION: 1. No significant interval change compared with 05/24/2017. No findings highly concerning for recurrent or metastatic disease status post right hemicolectomy. 2. Previously noted calcified and noncalcified nodules are stable when compared with the previous exam. A new tiny nodule within the right upper lobe measures 2-3 mm, nonspecific. This warrants attention on follow-up imaging. 3. Calcifications within the pancreas which may reflect chronic pancreatitis. 4. Multinodular thyroid gland with dominant nodule in the right lobe. Consider further evaluation with thyroid ultrasound. If patient is clinically hyperthyroid, consider nuclear medicine thyroid uptake and scan. Electronically Signed   By: Kerby Moors  M.D.   On: 05/25/2018 16:48    Assessment & Plan:   There are no diagnoses linked to this encounter.   No orders of the defined types were placed in this encounter.    Follow-up: No follow-ups on file.  Walker Kehr, MD

## 2018-12-26 NOTE — Assessment & Plan Note (Signed)
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 addiction risk, apnea etc) and complications (i.e. Somnolence, constipation and others) were explained to the patient and were aknowledged. 3/20 - reducing the dose per Katsumi's request w/caution Diazepam Benzo use risk is discussed - taking very little

## 2018-12-26 NOTE — Assessment & Plan Note (Signed)
On Glimeperide Jardiance 10 mg/d 2019

## 2019-02-13 ENCOUNTER — Telehealth: Payer: Self-pay | Admitting: Internal Medicine

## 2019-02-13 NOTE — Telephone Encounter (Signed)
Pt called and Is having some side effects to the jardiance and is no longer taking but would like an alternative sent in, please advise

## 2019-02-13 NOTE — Telephone Encounter (Signed)
I called pt- he states Jardiance caused excessive frequent urination and caused a rash on his arms and hands. He stopped taking the Jardiance on 02/08/19. Since his sxs have resolved. Please advise.

## 2019-02-14 MED ORDER — REPAGLINIDE 1 MG PO TABS: 1.0000 mg | ORAL_TABLET | Freq: Three times a day (TID) | ORAL | 11 refills | Status: DC

## 2019-02-14 NOTE — Addendum Note (Signed)
Addended by: Cassandria Anger on: 02/14/2019 09:22 PM   Modules accepted: Orders

## 2019-02-14 NOTE — Telephone Encounter (Signed)
Discontinue Jardiance.  Start Prandin 1 mg 3 times a day before meals.  Thanks

## 2019-02-15 NOTE — Telephone Encounter (Signed)
Pt informed of below.  

## 2019-03-12 ENCOUNTER — Other Ambulatory Visit: Payer: Self-pay | Admitting: Internal Medicine

## 2019-04-01 ENCOUNTER — Ambulatory Visit (INDEPENDENT_AMBULATORY_CARE_PROVIDER_SITE_OTHER): Payer: Medicare Other | Admitting: Internal Medicine

## 2019-04-01 ENCOUNTER — Encounter: Payer: Self-pay | Admitting: Internal Medicine

## 2019-04-01 ENCOUNTER — Other Ambulatory Visit: Payer: Self-pay

## 2019-04-01 DIAGNOSIS — E1142 Type 2 diabetes mellitus with diabetic polyneuropathy: Secondary | ICD-10-CM | POA: Diagnosis not present

## 2019-04-01 DIAGNOSIS — G8929 Other chronic pain: Secondary | ICD-10-CM | POA: Diagnosis not present

## 2019-04-01 DIAGNOSIS — M5440 Lumbago with sciatica, unspecified side: Secondary | ICD-10-CM

## 2019-04-01 MED ORDER — METFORMIN HCL 500 MG PO TABS: 500.0000 mg | ORAL_TABLET | Freq: Every day | ORAL | 11 refills | Status: DC

## 2019-04-01 MED ORDER — METHADONE HCL 10 MG PO TABS: 20.0000 mg | ORAL_TABLET | Freq: Three times a day (TID) | ORAL | 0 refills | Status: DC | PRN

## 2019-04-01 MED ORDER — REPAGLINIDE 2 MG PO TABS: 2.0000 mg | ORAL_TABLET | Freq: Three times a day (TID) | ORAL | 11 refills | Status: DC

## 2019-04-01 NOTE — Assessment & Plan Note (Signed)
On Glimeperide - d/c 2020 Jardiance 10 mg/d 2020 - d/c - rash 2020 Prandin 2 mg tid and Metformin 500 mg qd to re-try

## 2019-04-01 NOTE — Assessment & Plan Note (Signed)
   Potential benefits of a long term Methadone use as well as potential risks (i.e. addiction risk, apnea etc) and complications (i.e. Somnolence, constipation and others) were explained to the patient and were aknowledged. 3/20 - reducing the dose per Corderius's request w/caution Diazepam Benzo use risk is discussed - taking very little

## 2019-04-01 NOTE — Progress Notes (Signed)
Subjective:  Patient ID: Gabriel Erickson, male    DOB: [birth year]  Age: 78 y.o. MRN: [id number]  CC: No chief complaint on file.   HPI Gabriel Erickson presents for DM, LBP, GERD f/u   Outpatient Medications Prior to Visit  Medication Sig Dispense Refill  . ACCU-CHEK SOFTCLIX LANCETS lancets by Other route 2 (two) times daily. Use as instructed     . b complex vitamins tablet Take 1 tablet by mouth daily.      . Cholecalciferol 1000 UNITS tablet Take 1,000 Units by mouth daily.      . diazepam (VALIUM) 5 MG tablet Take 1 tablet (5 mg total) by mouth 2 (two) times daily as needed for muscle spasms (spasms). For cramps or insomnia 60 tablet 3  . famotidine (PEPCID) 40 MG tablet Take 1 tablet (40 mg total) by mouth daily. 90 tablet 3  . fluticasone (FLONASE) 50 MCG/ACT nasal spray Place 2 sprays into the nose daily. 16 g 11  . glimepiride (AMARYL) 4 MG tablet Take 1 tablet (4 mg total) by mouth 2 (two) times daily. 180 tablet 3  . methadone (DOLOPHINE) 10 MG tablet Take 2 tablets (20 mg total) by mouth every 8 (eight) hours as needed for severe pain. 180 tablet 0  . methadone (DOLOPHINE) 10 MG tablet Take 2 tablets (20 mg total) by mouth every 8 (eight) hours as needed for severe pain. For pain M54.5 180 tablet 0  . methadone (DOLOPHINE) 10 MG tablet Take 2 tablets (20 mg total) by mouth every 8 (eight) hours as needed for severe pain. 180 tablet 0  . nystatin cream (MYCOSTATIN) Apply 1 application topically 2 (two) times daily. 30 g 1  . ONETOUCH ULTRA test strip USE TO CHECK BLOOD SUGAR 2  TIMES DAILY 200 strip 3  . repaglinide (PRANDIN) 1 MG tablet Take 1 tablet (1 mg total) by mouth 3 (three) times daily before meals. 90 tablet 11   No facility-administered medications prior to visit.     ROS: Review of Systems  Constitutional: Negative for appetite change, fatigue and unexpected weight change.  HENT: Negative for congestion, nosebleeds, sneezing, sore throat and trouble swallowing.    Eyes: Negative for itching and visual disturbance.  Respiratory: Negative for cough.   Cardiovascular: Negative for chest pain, palpitations and leg swelling.  Gastrointestinal: Negative for abdominal distention, blood in stool, diarrhea and nausea.  Genitourinary: Negative for frequency and hematuria.  Musculoskeletal: Positive for back pain and gait problem. Negative for joint swelling and neck pain.  Skin: Negative for rash.  Neurological: Negative for dizziness, tremors, speech difficulty and weakness.  Psychiatric/Behavioral: Negative for agitation, dysphoric mood, sleep disturbance and suicidal ideas. The patient is not nervous/anxious.     Objective:  BP (!) 148/84 (BP Location: Left Arm, Patient Position: Sitting, Cuff Size: Normal)   Pulse 83   Temp 98.1 F (36.7 C) (Oral)   Ht 6' (1.829 m)   Wt 171 lb (77.6 kg)   SpO2 97%   BMI 23.19 kg/m   BP Readings from Last 3 Encounters:  04/01/19 (!) 148/84  12/26/18 140/86  09/28/18 (!) 146/78    Wt Readings from Last 3 Encounters:  04/01/19 171 lb (77.6 kg)  12/26/18 172 lb (78 kg)  09/28/18 176 lb (79.8 kg)    Physical Exam Constitutional:      General: He is not in acute distress.    Appearance: He is well-developed.     Comments: NAD  Eyes:  Conjunctiva/sclera: Conjunctivae normal.     Pupils: Pupils are equal, round, and reactive to light.  Neck:     Musculoskeletal: Normal range of motion.     Thyroid: No thyromegaly.     Vascular: No JVD.  Cardiovascular:     Rate and Rhythm: Normal rate and regular rhythm.     Heart sounds: Normal heart sounds. No murmur. No friction rub. No gallop.   Pulmonary:     Effort: Pulmonary effort is normal. No respiratory distress.     Breath sounds: Normal breath sounds. No wheezing or rales.  Chest:     Chest wall: No tenderness.  Abdominal:     General: Bowel sounds are normal. There is no distension.     Palpations: Abdomen is soft. There is no mass.     Tenderness:  There is no abdominal tenderness. There is no guarding or rebound.  Musculoskeletal: Normal range of motion.        General: Tenderness present.  Lymphadenopathy:     Cervical: No cervical adenopathy.  Skin:    General: Skin is warm and dry.     Findings: No rash.  Neurological:     Mental Status: He is alert and oriented to person, place, and time.     Cranial Nerves: No cranial nerve deficit.     Motor: No abnormal muscle tone.     Coordination: Coordination normal.     Gait: Gait normal.     Deep Tendon Reflexes: Reflexes are normal and symmetric.  Psychiatric:        Behavior: Behavior normal.        Thought Content: Thought content normal.        Judgment: Judgment normal.     Lab Results  Component Value Date   WBC 6.1 09/28/2018   HGB 16.3 09/28/2018   HCT 47.7 09/28/2018   PLT 209.0 09/28/2018   GLUCOSE 117 (H) 09/28/2018   CHOL 199 02/25/2014   TRIG 239 (H) 02/25/2014   HDL 43 02/25/2014   LDLDIRECT 142.3 04/12/2010   LDLCALC 108 (H) 02/25/2014   ALT 27 09/28/2018   AST 24 09/28/2018   NA 138 09/28/2018   K 4.6 09/28/2018   CL 101 09/28/2018   CREATININE 0.87 09/28/2018   BUN 16 09/28/2018   CO2 31 09/28/2018   TSH 0.42 06/19/2017   PSA 3.22 06/19/2017   HGBA1C 7.6 (H) 09/28/2018   MICROALBUR <0.7 06/19/2017    Ct Chest W Contrast  Result Date: 05/25/2018 CLINICAL DATA:  Follow-up colon cancer. EXAM: CT CHEST, ABDOMEN, AND PELVIS WITH CONTRAST TECHNIQUE: Multidetector CT imaging of the chest, abdomen and pelvis was performed following the standard protocol during bolus administration of intravenous contrast. CONTRAST:  146mL OMNIPAQUE IOHEXOL 300 MG/ML  SOLN COMPARISON:  05/24/2017 FINDINGS: CT CHEST FINDINGS Cardiovascular: Normal heart size.  No pericardial effusion. Mediastinum/Nodes: Multinodular thyroid gland is again noted. Dominant nodule in the right lobe measures 2.2 cm, image 9/2. The trachea appears patent and is midline. Normal appearance of the  esophagus. No supraclavicular or axillary adenopathy. No mediastinal or hilar adenopathy. Lungs/Pleura: No pleural effusion. Stable calcified nodule in the right upper lobe measuring 6 mm, image 49/4. Unchanged. Small anterior right upper lobe lung nodule measuring 5 mm is stable, image 66/4. New right upper lobe lung nodule measures 2 mm, image 64/4. Right upper lobe perifissural nodule is stable measuring 5 mm, image 76/4. No additional pulmonary nodules identified. Musculoskeletal: No aggressive lytic or sclerotic bone lesions identified. CT  ABDOMEN PELVIS FINDINGS Hepatobiliary: There are no suspicious liver lesions. The gallbladder appears normal. No biliary ductal dilatation. Pancreas: Scattered pancreatic calcifications are noted which may be the sequelae of chronic pancreatitis. No pancreatic inflammation, main duct dilatation or mass noted. Spleen: Spleen appears normal. Adrenals/Urinary Tract: The adrenal glands are unremarkable. Right kidney is normal. Small low-density structure within the inferior pole of left kidney measures 5 mm and is too small to characterize. Urinary bladder appears within normal limits. Stomach/Bowel: Stomach is normal. The small bowel loops have a normal caliber. Postoperative changes from right hemicolectomy identified. Enterocolonic anastomosis. No specific findings at the anastomosis to suggest local tumor recurrence or complication. No pathologic dilatation of the remaining colon. Distal colonic diverticulosis without acute inflammation. Vascular/Lymphatic: Normal appearance of the abdominal aorta. No enlarged retroperitoneal or mesenteric adenopathy. No enlarged pelvic or inguinal lymph nodes. Reproductive: Prostate is unremarkable. Other: No abdominal wall hernia or abnormality. No abdominopelvic ascites. Musculoskeletal: No acute or significant osseous findings. IMPRESSION: 1. No significant interval change compared with 05/24/2017. No findings highly concerning for  recurrent or metastatic disease status post right hemicolectomy. 2. Previously noted calcified and noncalcified nodules are stable when compared with the previous exam. A new tiny nodule within the right upper lobe measures 2-3 mm, nonspecific. This warrants attention on follow-up imaging. 3. Calcifications within the pancreas which may reflect chronic pancreatitis. 4. Multinodular thyroid gland with dominant nodule in the right lobe. Consider further evaluation with thyroid ultrasound. If patient is clinically hyperthyroid, consider nuclear medicine thyroid uptake and scan. Electronically Signed   By: Kerby Moors M.D.   On: 05/25/2018 16:48   Ct Abdomen Pelvis W Contrast  Result Date: 05/25/2018 CLINICAL DATA:  Follow-up colon cancer. EXAM: CT CHEST, ABDOMEN, AND PELVIS WITH CONTRAST TECHNIQUE: Multidetector CT imaging of the chest, abdomen and pelvis was performed following the standard protocol during bolus administration of intravenous contrast. CONTRAST:  199mL OMNIPAQUE IOHEXOL 300 MG/ML  SOLN COMPARISON:  05/24/2017 FINDINGS: CT CHEST FINDINGS Cardiovascular: Normal heart size.  No pericardial effusion. Mediastinum/Nodes: Multinodular thyroid gland is again noted. Dominant nodule in the right lobe measures 2.2 cm, image 9/2. The trachea appears patent and is midline. Normal appearance of the esophagus. No supraclavicular or axillary adenopathy. No mediastinal or hilar adenopathy. Lungs/Pleura: No pleural effusion. Stable calcified nodule in the right upper lobe measuring 6 mm, image 49/4. Unchanged. Small anterior right upper lobe lung nodule measuring 5 mm is stable, image 66/4. New right upper lobe lung nodule measures 2 mm, image 64/4. Right upper lobe perifissural nodule is stable measuring 5 mm, image 76/4. No additional pulmonary nodules identified. Musculoskeletal: No aggressive lytic or sclerotic bone lesions identified. CT ABDOMEN PELVIS FINDINGS Hepatobiliary: There are no suspicious liver  lesions. The gallbladder appears normal. No biliary ductal dilatation. Pancreas: Scattered pancreatic calcifications are noted which may be the sequelae of chronic pancreatitis. No pancreatic inflammation, main duct dilatation or mass noted. Spleen: Spleen appears normal. Adrenals/Urinary Tract: The adrenal glands are unremarkable. Right kidney is normal. Small low-density structure within the inferior pole of left kidney measures 5 mm and is too small to characterize. Urinary bladder appears within normal limits. Stomach/Bowel: Stomach is normal. The small bowel loops have a normal caliber. Postoperative changes from right hemicolectomy identified. Enterocolonic anastomosis. No specific findings at the anastomosis to suggest local tumor recurrence or complication. No pathologic dilatation of the remaining colon. Distal colonic diverticulosis without acute inflammation. Vascular/Lymphatic: Normal appearance of the abdominal aorta. No enlarged retroperitoneal or mesenteric adenopathy.  No enlarged pelvic or inguinal lymph nodes. Reproductive: Prostate is unremarkable. Other: No abdominal wall hernia or abnormality. No abdominopelvic ascites. Musculoskeletal: No acute or significant osseous findings. IMPRESSION: 1. No significant interval change compared with 05/24/2017. No findings highly concerning for recurrent or metastatic disease status post right hemicolectomy. 2. Previously noted calcified and noncalcified nodules are stable when compared with the previous exam. A new tiny nodule within the right upper lobe measures 2-3 mm, nonspecific. This warrants attention on follow-up imaging. 3. Calcifications within the pancreas which may reflect chronic pancreatitis. 4. Multinodular thyroid gland with dominant nodule in the right lobe. Consider further evaluation with thyroid ultrasound. If patient is clinically hyperthyroid, consider nuclear medicine thyroid uptake and scan. Electronically Signed   By: Kerby Moors  M.D.   On: 05/25/2018 16:48    Assessment & Plan:   There are no diagnoses linked to this encounter.   No orders of the defined types were placed in this encounter.    Follow-up: No follow-ups on file.  Walker Kehr, MD

## 2019-07-01 ENCOUNTER — Other Ambulatory Visit (INDEPENDENT_AMBULATORY_CARE_PROVIDER_SITE_OTHER): Payer: Medicare Other

## 2019-07-01 ENCOUNTER — Encounter: Payer: Self-pay | Admitting: Internal Medicine

## 2019-07-01 ENCOUNTER — Ambulatory Visit (INDEPENDENT_AMBULATORY_CARE_PROVIDER_SITE_OTHER): Payer: Medicare Other | Admitting: Internal Medicine

## 2019-07-01 ENCOUNTER — Other Ambulatory Visit: Payer: Self-pay

## 2019-07-01 VITALS — BP 150/88 | HR 86 | Temp 98.0°F | Ht 72.0 in | Wt 173.0 lb

## 2019-07-01 DIAGNOSIS — C182 Malignant neoplasm of ascending colon: Secondary | ICD-10-CM | POA: Diagnosis not present

## 2019-07-01 DIAGNOSIS — E785 Hyperlipidemia, unspecified: Secondary | ICD-10-CM | POA: Diagnosis not present

## 2019-07-01 DIAGNOSIS — M5440 Lumbago with sciatica, unspecified side: Secondary | ICD-10-CM

## 2019-07-01 DIAGNOSIS — E1142 Type 2 diabetes mellitus with diabetic polyneuropathy: Secondary | ICD-10-CM

## 2019-07-01 DIAGNOSIS — G8929 Other chronic pain: Secondary | ICD-10-CM

## 2019-07-01 DIAGNOSIS — R030 Elevated blood-pressure reading, without diagnosis of hypertension: Secondary | ICD-10-CM

## 2019-07-01 LAB — CBC WITH DIFFERENTIAL/PLATELET
Basophils Absolute: 0 10*3/uL (ref 0.0–0.1)
Basophils Relative: 0.4 % (ref 0.0–3.0)
Eosinophils Absolute: 0 10*3/uL (ref 0.0–0.7)
Eosinophils Relative: 1 % (ref 0.0–5.0)
HCT: 45.6 % (ref 39.0–52.0)
Hemoglobin: 15.7 g/dL (ref 13.0–17.0)
Lymphocytes Relative: 14.2 % (ref 12.0–46.0)
Lymphs Abs: 0.7 10*3/uL (ref 0.7–4.0)
MCHC: 34.4 g/dL (ref 30.0–36.0)
MCV: 95.5 fl (ref 78.0–100.0)
Monocytes Absolute: 0.8 10*3/uL (ref 0.1–1.0)
Monocytes Relative: 16.2 % — ABNORMAL HIGH (ref 3.0–12.0)
Neutro Abs: 3.4 10*3/uL (ref 1.4–7.7)
Neutrophils Relative %: 68.2 % (ref 43.0–77.0)
Platelets: 214 10*3/uL (ref 150.0–400.0)
RBC: 4.77 Mil/uL (ref 4.22–5.81)
RDW: 12.4 % (ref 11.5–15.5)
WBC: 4.9 10*3/uL (ref 4.0–10.5)

## 2019-07-01 LAB — HEMOGLOBIN A1C: Hgb A1c MFr Bld: 7.3 % — ABNORMAL HIGH (ref 4.6–6.5)

## 2019-07-01 LAB — URINALYSIS
Bilirubin Urine: NEGATIVE
Hgb urine dipstick: NEGATIVE
Ketones, ur: NEGATIVE
Leukocytes,Ua: NEGATIVE
Nitrite: NEGATIVE
Specific Gravity, Urine: 1.02 (ref 1.000–1.030)
Total Protein, Urine: NEGATIVE
Urine Glucose: 100 — AB
Urobilinogen, UA: 0.2 (ref 0.0–1.0)
pH: 6.5 (ref 5.0–8.0)

## 2019-07-01 LAB — HEPATIC FUNCTION PANEL
ALT: 23 U/L (ref 0–53)
AST: 21 U/L (ref 0–37)
Albumin: 4.6 g/dL (ref 3.5–5.2)
Alkaline Phosphatase: 74 U/L (ref 39–117)
Bilirubin, Direct: 0.1 mg/dL (ref 0.0–0.3)
Total Bilirubin: 0.5 mg/dL (ref 0.2–1.2)
Total Protein: 7.8 g/dL (ref 6.0–8.3)

## 2019-07-01 LAB — BASIC METABOLIC PANEL
BUN: 8 mg/dL (ref 6–23)
CO2: 31 mEq/L (ref 19–32)
Calcium: 9.7 mg/dL (ref 8.4–10.5)
Chloride: 101 mEq/L (ref 96–112)
Creatinine, Ser: 0.77 mg/dL (ref 0.40–1.50)
GFR: 98.17 mL/min (ref >60.00)
Glucose, Bld: 113 mg/dL — ABNORMAL HIGH (ref 70–99)
Potassium: 4.2 mEq/L (ref 3.5–5.1)
Sodium: 139 mEq/L (ref 135–145)

## 2019-07-01 LAB — LIPID PANEL
Cholesterol: 220 mg/dL — ABNORMAL HIGH (ref 0–200)
HDL: 37.6 mg/dL — ABNORMAL LOW (ref >39.00)
LDL Cholesterol: 145 mg/dL — ABNORMAL HIGH (ref 0–99)
NonHDL: 182.49
Total CHOL/HDL Ratio: 6
Triglycerides: 188 mg/dL — ABNORMAL HIGH (ref 0.0–149.0)
VLDL: 37.6 mg/dL (ref 0.0–40.0)

## 2019-07-01 LAB — PSA: PSA: 1.86 ng/mL (ref 0.10–4.00)

## 2019-07-01 LAB — TSH: TSH: 0.51 u[IU]/mL (ref 0.35–4.50)

## 2019-07-01 LAB — CEA: CEA: 2.4 ng/mL

## 2019-07-01 MED ORDER — METHADONE HCL 10 MG PO TABS: 20.0000 mg | ORAL_TABLET | Freq: Three times a day (TID) | ORAL | 0 refills | Status: DC | PRN

## 2019-07-01 MED ORDER — DIAZEPAM 5 MG PO TABS: 5.0000 mg | ORAL_TABLET | Freq: Two times a day (BID) | ORAL | 3 refills | Status: DC | PRN

## 2019-07-01 NOTE — Progress Notes (Signed)
Subjective:  Patient ID: Gabriel Erickson, male    DOB: 1941/05/03  Age: 78 y.o. MRN: [id number]  CC: No chief complaint on file.   HPI [name] presents for DM, HTN, LBP f/u  Outpatient Medications Prior to Visit  Medication Sig Dispense Refill  . ACCU-CHEK SOFTCLIX LANCETS lancets by Other route 2 (two) times daily. Use as instructed     . b complex vitamins tablet Take 1 tablet by mouth daily.      . Cholecalciferol 1000 UNITS tablet Take 1,000 Units by mouth daily.      . diazepam (VALIUM) 5 MG tablet Take 1 tablet (5 mg total) by mouth 2 (two) times daily as needed for muscle spasms (spasms). For cramps or insomnia 60 tablet 3  . famotidine (PEPCID) 40 MG tablet Take 1 tablet (40 mg total) by mouth daily. 90 tablet 3  . fluticasone (FLONASE) 50 MCG/ACT nasal spray Place 2 sprays into the nose daily. 16 g 11  . glimepiride (AMARYL) 4 MG tablet Take 4 mg by mouth 2 (two) times daily.    . metFORMIN (GLUCOPHAGE) 500 MG tablet Take 1 tablet (500 mg total) by mouth daily with breakfast. 30 tablet 11  . methadone (DOLOPHINE) 10 MG tablet Take 2 tablets (20 mg total) by mouth every 8 (eight) hours as needed for severe pain. For pain M54.5 180 tablet 0  . methadone (DOLOPHINE) 10 MG tablet Take 2 tablets (20 mg total) by mouth every 8 (eight) hours as needed for severe pain. 180 tablet 0  . methadone (DOLOPHINE) 10 MG tablet Take 2 tablets (20 mg total) by mouth every 8 (eight) hours as needed for severe pain. 180 tablet 0  . nystatin cream (MYCOSTATIN) Apply 1 application topically 2 (two) times daily. 30 g 1  . ONETOUCH ULTRA test strip USE TO CHECK BLOOD SUGAR 2  TIMES DAILY 200 strip 3  . repaglinide (PRANDIN) 2 MG tablet Take 1 tablet (2 mg total) by mouth 3 (three) times daily before meals. 90 tablet 11   No facility-administered medications prior to visit.    ROS: Review of Systems  Constitutional: Negative for appetite change, fatigue and unexpected weight change.  HENT: Negative  for congestion, nosebleeds, sneezing, sore throat and trouble swallowing.   Eyes: Negative for itching and visual disturbance.  Respiratory: Negative for cough.   Cardiovascular: Negative for chest pain, palpitations and leg swelling.  Gastrointestinal: Negative for abdominal distention, blood in stool, diarrhea and nausea.  Genitourinary: Negative for frequency and hematuria.  Musculoskeletal: Positive for back pain and gait problem. Negative for joint swelling and neck pain.  Skin: Negative for rash.  Neurological: Negative for dizziness, tremors, speech difficulty and weakness.  Psychiatric/Behavioral: Negative for agitation, dysphoric mood and sleep disturbance. The patient is not nervous/anxious.     Objective:  BP (!) 150/88 (BP Location: Left Arm, Patient Position: Sitting, Cuff Size: Normal)   Pulse 86   Temp 98 F (36.7 C) (Oral)   Ht 6' (1.829 m)   Wt 173 lb (78.5 kg)   SpO2 98%   BMI 23.46 kg/m   BP Readings from Last 3 Encounters:  07/01/19 (!) 150/88  04/01/19 (!) 148/84  12/26/18 140/86    Wt Readings from Last 3 Encounters:  07/01/19 173 lb (78.5 kg)  04/01/19 171 lb (77.6 kg)  12/26/18 172 lb (78 kg)    Physical Exam Constitutional:      General: He is not in acute distress.  Appearance: He is well-developed.     Comments: NAD  Eyes:     Conjunctiva/sclera: Conjunctivae normal.     Pupils: Pupils are equal, round, and reactive to light.  Neck:     Thyroid: No thyromegaly.     Vascular: No JVD.  Cardiovascular:     Rate and Rhythm: Normal rate and regular rhythm.     Heart sounds: Normal heart sounds. No murmur. No friction rub. No gallop.   Pulmonary:     Effort: Pulmonary effort is normal. No respiratory distress.     Breath sounds: Normal breath sounds. No wheezing or rales.  Chest:     Chest wall: No tenderness.  Abdominal:     General: Bowel sounds are normal. There is no distension.     Palpations: Abdomen is soft. There is no mass.      Tenderness: There is no abdominal tenderness. There is no guarding or rebound.  Musculoskeletal:        General: Tenderness present. Normal range of motion.     Cervical back: Normal range of motion.  Lymphadenopathy:     Cervical: No cervical adenopathy.  Skin:    General: Skin is warm and dry.     Findings: No rash.  Neurological:     Mental Status: He is alert and oriented to person, place, and time.     Cranial Nerves: No cranial nerve deficit.     Motor: No abnormal muscle tone.     Coordination: Coordination normal.     Gait: Gait normal.     Deep Tendon Reflexes: Reflexes are normal and symmetric.  Psychiatric:        Behavior: Behavior normal.        Thought Content: Thought content normal.        Judgment: Judgment normal.   LS tender Weak legs  Lab Results  Component Value Date   WBC 6.1 09/28/2018   HGB 16.3 09/28/2018   HCT 47.7 09/28/2018   PLT 209.0 09/28/2018   GLUCOSE 117 (H) 09/28/2018   CHOL 199 02/25/2014   TRIG 239 (H) 02/25/2014   HDL 43 02/25/2014   LDLDIRECT 142.3 04/12/2010   LDLCALC 108 (H) 02/25/2014   ALT 27 09/28/2018   AST 24 09/28/2018   NA 138 09/28/2018   K 4.6 09/28/2018   CL 101 09/28/2018   CREATININE 0.87 09/28/2018   BUN 16 09/28/2018   CO2 31 09/28/2018   TSH 0.42 06/19/2017   PSA 3.22 06/19/2017   HGBA1C 7.6 (H) 09/28/2018   MICROALBUR <0.7 06/19/2017    CT Chest W Contrast  Result Date: 05/25/2018 CLINICAL DATA:  Follow-up colon cancer. EXAM: CT CHEST, ABDOMEN, AND PELVIS WITH CONTRAST TECHNIQUE: Multidetector CT imaging of the chest, abdomen and pelvis was performed following the standard protocol during bolus administration of intravenous contrast. CONTRAST:  175mL OMNIPAQUE IOHEXOL 300 MG/ML  SOLN COMPARISON:  05/24/2017 FINDINGS: CT CHEST FINDINGS Cardiovascular: Normal heart size.  No pericardial effusion. Mediastinum/Nodes: Multinodular thyroid gland is again noted. Dominant nodule in the right lobe measures 2.2 cm,  image 9/2. The trachea appears patent and is midline. Normal appearance of the esophagus. No supraclavicular or axillary adenopathy. No mediastinal or hilar adenopathy. Lungs/Pleura: No pleural effusion. Stable calcified nodule in the right upper lobe measuring 6 mm, image 49/4. Unchanged. Small anterior right upper lobe lung nodule measuring 5 mm is stable, image 66/4. New right upper lobe lung nodule measures 2 mm, image 64/4. Right upper lobe perifissural nodule is stable  measuring 5 mm, image 76/4. No additional pulmonary nodules identified. Musculoskeletal: No aggressive lytic or sclerotic bone lesions identified. CT ABDOMEN PELVIS FINDINGS Hepatobiliary: There are no suspicious liver lesions. The gallbladder appears normal. No biliary ductal dilatation. Pancreas: Scattered pancreatic calcifications are noted which may be the sequelae of chronic pancreatitis. No pancreatic inflammation, main duct dilatation or mass noted. Spleen: Spleen appears normal. Adrenals/Urinary Tract: The adrenal glands are unremarkable. Right kidney is normal. Small low-density structure within the inferior pole of left kidney measures 5 mm and is too small to characterize. Urinary bladder appears within normal limits. Stomach/Bowel: Stomach is normal. The small bowel loops have a normal caliber. Postoperative changes from right hemicolectomy identified. Enterocolonic anastomosis. No specific findings at the anastomosis to suggest local tumor recurrence or complication. No pathologic dilatation of the remaining colon. Distal colonic diverticulosis without acute inflammation. Vascular/Lymphatic: Normal appearance of the abdominal aorta. No enlarged retroperitoneal or mesenteric adenopathy. No enlarged pelvic or inguinal lymph nodes. Reproductive: Prostate is unremarkable. Other: No abdominal wall hernia or abnormality. No abdominopelvic ascites. Musculoskeletal: No acute or significant osseous findings. IMPRESSION: 1. No significant  interval change compared with 05/24/2017. No findings highly concerning for recurrent or metastatic disease status post right hemicolectomy. 2. Previously noted calcified and noncalcified nodules are stable when compared with the previous exam. A new tiny nodule within the right upper lobe measures 2-3 mm, nonspecific. This warrants attention on follow-up imaging. 3. Calcifications within the pancreas which may reflect chronic pancreatitis. 4. Multinodular thyroid gland with dominant nodule in the right lobe. Consider further evaluation with thyroid ultrasound. If patient is clinically hyperthyroid, consider nuclear medicine thyroid uptake and scan. Electronically Signed   By: Kerby Moors M.D.   On: 05/25/2018 16:48   CT Abdomen Pelvis W Contrast  Result Date: 05/25/2018 CLINICAL DATA:  Follow-up colon cancer. EXAM: CT CHEST, ABDOMEN, AND PELVIS WITH CONTRAST TECHNIQUE: Multidetector CT imaging of the chest, abdomen and pelvis was performed following the standard protocol during bolus administration of intravenous contrast. CONTRAST:  16mL OMNIPAQUE IOHEXOL 300 MG/ML  SOLN COMPARISON:  05/24/2017 FINDINGS: CT CHEST FINDINGS Cardiovascular: Normal heart size.  No pericardial effusion. Mediastinum/Nodes: Multinodular thyroid gland is again noted. Dominant nodule in the right lobe measures 2.2 cm, image 9/2. The trachea appears patent and is midline. Normal appearance of the esophagus. No supraclavicular or axillary adenopathy. No mediastinal or hilar adenopathy. Lungs/Pleura: No pleural effusion. Stable calcified nodule in the right upper lobe measuring 6 mm, image 49/4. Unchanged. Small anterior right upper lobe lung nodule measuring 5 mm is stable, image 66/4. New right upper lobe lung nodule measures 2 mm, image 64/4. Right upper lobe perifissural nodule is stable measuring 5 mm, image 76/4. No additional pulmonary nodules identified. Musculoskeletal: No aggressive lytic or sclerotic bone lesions  identified. CT ABDOMEN PELVIS FINDINGS Hepatobiliary: There are no suspicious liver lesions. The gallbladder appears normal. No biliary ductal dilatation. Pancreas: Scattered pancreatic calcifications are noted which may be the sequelae of chronic pancreatitis. No pancreatic inflammation, main duct dilatation or mass noted. Spleen: Spleen appears normal. Adrenals/Urinary Tract: The adrenal glands are unremarkable. Right kidney is normal. Small low-density structure within the inferior pole of left kidney measures 5 mm and is too small to characterize. Urinary bladder appears within normal limits. Stomach/Bowel: Stomach is normal. The small bowel loops have a normal caliber. Postoperative changes from right hemicolectomy identified. Enterocolonic anastomosis. No specific findings at the anastomosis to suggest local tumor recurrence or complication. No pathologic dilatation of the remaining  colon. Distal colonic diverticulosis without acute inflammation. Vascular/Lymphatic: Normal appearance of the abdominal aorta. No enlarged retroperitoneal or mesenteric adenopathy. No enlarged pelvic or inguinal lymph nodes. Reproductive: Prostate is unremarkable. Other: No abdominal wall hernia or abnormality. No abdominopelvic ascites. Musculoskeletal: No acute or significant osseous findings. IMPRESSION: 1. No significant interval change compared with 05/24/2017. No findings highly concerning for recurrent or metastatic disease status post right hemicolectomy. 2. Previously noted calcified and noncalcified nodules are stable when compared with the previous exam. A new tiny nodule within the right upper lobe measures 2-3 mm, nonspecific. This warrants attention on follow-up imaging. 3. Calcifications within the pancreas which may reflect chronic pancreatitis. 4. Multinodular thyroid gland with dominant nodule in the right lobe. Consider further evaluation with thyroid ultrasound. If patient is clinically hyperthyroid, consider  nuclear medicine thyroid uptake and scan. Electronically Signed   By: Kerby Moors M.D.   On: 05/25/2018 16:48    Assessment & Plan:   There are no diagnoses linked to this encounter.   No orders of the defined types were placed in this encounter.    Follow-up: No follow-ups on file.  Walker Kehr, MD

## 2019-07-01 NOTE — Assessment & Plan Note (Signed)
Glimepiride and Metformin 500 mg qd

## 2019-07-01 NOTE — Assessment & Plan Note (Signed)
Keep Hct <45%

## 2019-07-01 NOTE — Assessment & Plan Note (Signed)
  BP nl at home 

## 2019-07-01 NOTE — Assessment & Plan Note (Signed)
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 ? Potential benefits of a long term Methadone use as well as potential risks (i.e. addiction risk, apnea etc) and complications (i.e. Somnolence, constipation and others) were explained to the patient and were aknowledged. ?3/20 - reducing the dose per Billal's request w/caution ?Diazepam ?

## 2019-07-02 ENCOUNTER — Telehealth: Payer: Self-pay | Admitting: Internal Medicine

## 2019-07-02 NOTE — Telephone Encounter (Signed)
Pt wife calling to check status. Please advise  °

## 2019-07-02 NOTE — Telephone Encounter (Signed)
Please advise 

## 2019-07-02 NOTE — Telephone Encounter (Signed)
Pt's wife stated pt developed a fever of 100 last night and his sinuses continue to be irritated. They would like to know if Dr. Alain Marion would send something to pt's pharmacy. Stated he saw him yesterday for an appt. Please advise.  [facility] [place], [place] - [address] AT [address] Phone:  [phone]  Fax:  [phone]

## 2019-07-03 MED ORDER — CEFUROXIME AXETIL 500 MG PO TABS: 500.0000 mg | ORAL_TABLET | Freq: Two times a day (BID) | ORAL | 0 refills | Status: DC

## 2019-07-03 NOTE — Telephone Encounter (Signed)
Pt's wife calling again to find out if provider has any advice.

## 2019-07-03 NOTE — Telephone Encounter (Signed)
Take Tylenol for fever COVID test if worse I'll email Rx for Cefuroxime if cough w/green sputum etc (done) Feel better! AP

## 2019-07-03 NOTE — Telephone Encounter (Signed)
Pt.notified

## 2019-07-04 ENCOUNTER — Telehealth: Payer: Self-pay | Admitting: Internal Medicine

## 2019-07-04 NOTE — Telephone Encounter (Signed)
Please advise 

## 2019-07-04 NOTE — Telephone Encounter (Signed)
Pt wife called and stated that he would not pick up medication at drug store because he is allergic to penicillin. Pt would like something called in as soon as possible. Please advise

## 2019-07-04 NOTE — Telephone Encounter (Signed)
Patient's wife called again because the medication that was sent in, .cefUROXime (CEFTIN) 500 MG tablet , her husband is allergic to.  He really needs another medication as soon as possible.  CB# [phone]

## 2019-07-05 MED ORDER — DOXYCYCLINE HYCLATE 100 MG PO TABS: 100.0000 mg | ORAL_TABLET | Freq: Two times a day (BID) | ORAL | 0 refills | Status: DC

## 2019-07-05 NOTE — Telephone Encounter (Signed)
Patient called in stating he is still waiting for the medication be changed to Doxycycline. Pt also would like to notify PCP he did eat at restaurant that closed due to 2 workers testing positive for COVID-19. Pt did come in to office Monday. Pt started having fever Tuesday and states he has been trying to get this antibiotic changed since then with no answer. Please advise as pt would like to know what he should do.

## 2019-07-05 NOTE — Telephone Encounter (Signed)
Pt.notified

## 2019-07-05 NOTE — Telephone Encounter (Signed)
According to the chart he is allergic to ciprofloxacin, not cefuroxime.  What kind  of reaction did he have to cefuroxime?  Nevertheless, I will switch to doxycycline.  He can get tested for COVID-19 locally.  There should be a COVID tesing place in [place]. Thx

## 2019-07-08 DIAGNOSIS — B342 Coronavirus infection, unspecified: Secondary | ICD-10-CM | POA: Insufficient documentation

## 2019-09-18 ENCOUNTER — Telehealth: Payer: Self-pay

## 2019-09-18 NOTE — Telephone Encounter (Signed)
New message   Medication needs prior authorization ONETOUCH ULTRA test strip. Please advise.

## 2019-09-23 NOTE — Telephone Encounter (Signed)
[facility] is calling to follow up on refill request. Also stated they have faxed this request as well.

## 2019-09-23 NOTE — Telephone Encounter (Signed)
Key: [id number]

## 2019-09-25 ENCOUNTER — Other Ambulatory Visit: Payer: Self-pay | Admitting: Internal Medicine

## 2019-09-25 MED ORDER — ONETOUCH ULTRA VI STRP: ORAL_STRIP | 3 refills | Status: DC

## 2019-09-25 NOTE — Telephone Encounter (Signed)
New message:   Pt states he needing a new prescription sent for his test strips to [facility]. He is no longer using [facility]. Pt is needing a 90 day supply so he can get 200 strips. Please advise.

## 2019-09-25 NOTE — Telephone Encounter (Signed)
Reviewed chart pt is up-to-date sent refills to [facility]../lmb  

## 2019-09-27 ENCOUNTER — Other Ambulatory Visit: Payer: Self-pay

## 2019-09-27 MED ORDER — TRUE METRIX LEVEL 2 NORMAL VI SOLN: 0 refills | Status: DC

## 2019-09-27 MED ORDER — GLUCOSE BLOOD VI STRP: ORAL_STRIP | 3 refills | Status: DC

## 2019-09-27 MED ORDER — TRUEPLUS LANCETS 30G MISC: 3 refills | Status: DC

## 2019-09-27 MED ORDER — TRUE METRIX METER W/DEVICE KIT: PACK | 0 refills | Status: DC

## 2019-09-27 MED ORDER — ALCOHOL SWABS PADS: MEDICATED_PAD | 3 refills | Status: DC

## 2019-09-30 ENCOUNTER — Encounter: Payer: Self-pay | Admitting: Internal Medicine

## 2019-09-30 ENCOUNTER — Other Ambulatory Visit: Payer: Self-pay

## 2019-09-30 ENCOUNTER — Ambulatory Visit: Payer: Medicare PPO | Admitting: Internal Medicine

## 2019-09-30 DIAGNOSIS — E119 Type 2 diabetes mellitus without complications: Secondary | ICD-10-CM

## 2019-09-30 DIAGNOSIS — E1142 Type 2 diabetes mellitus with diabetic polyneuropathy: Secondary | ICD-10-CM

## 2019-09-30 DIAGNOSIS — R030 Elevated blood-pressure reading, without diagnosis of hypertension: Secondary | ICD-10-CM | POA: Diagnosis not present

## 2019-09-30 LAB — CBC WITH DIFFERENTIAL/PLATELET
Basophils Absolute: 0 10*3/uL (ref 0.0–0.1)
Basophils Relative: 0.7 % (ref 0.0–3.0)
Eosinophils Absolute: 0.1 10*3/uL (ref 0.0–0.7)
Eosinophils Relative: 1.1 % (ref 0.0–5.0)
HCT: 41.8 % (ref 39.0–52.0)
Hemoglobin: 14.4 g/dL (ref 13.0–17.0)
Lymphocytes Relative: 34.2 % (ref 12.0–46.0)
Lymphs Abs: 1.8 10*3/uL (ref 0.7–4.0)
MCHC: 34.3 g/dL (ref 30.0–36.0)
MCV: 96.6 fl (ref 78.0–100.0)
Monocytes Absolute: 0.6 10*3/uL (ref 0.1–1.0)
Monocytes Relative: 11 % (ref 3.0–12.0)
Neutro Abs: 2.7 10*3/uL (ref 1.4–7.7)
Neutrophils Relative %: 53 % (ref 43.0–77.0)
Platelets: 225 10*3/uL (ref 150.0–400.0)
RBC: 4.33 Mil/uL (ref 4.22–5.81)
RDW: 13.1 % (ref 11.5–15.5)
WBC: 5.2 10*3/uL (ref 4.0–10.5)

## 2019-09-30 LAB — BASIC METABOLIC PANEL
BUN: 10 mg/dL (ref 6–23)
CO2: 30 mEq/L (ref 19–32)
Calcium: 9.4 mg/dL (ref 8.4–10.5)
Chloride: 104 mEq/L (ref 96–112)
Creatinine, Ser: 0.68 mg/dL (ref 0.40–1.50)
GFR: 113.24 mL/min (ref >60.00)
Glucose, Bld: 111 mg/dL — ABNORMAL HIGH (ref 70–99)
Potassium: 4.4 mEq/L (ref 3.5–5.1)
Sodium: 138 mEq/L (ref 135–145)

## 2019-09-30 LAB — HEPATIC FUNCTION PANEL
ALT: 28 U/L (ref 0–53)
AST: 28 U/L (ref 0–37)
Albumin: 4 g/dL (ref 3.5–5.2)
Alkaline Phosphatase: 69 U/L (ref 39–117)
Bilirubin, Direct: 0.1 mg/dL (ref 0.0–0.3)
Total Bilirubin: 0.6 mg/dL (ref 0.2–1.2)
Total Protein: 7.3 g/dL (ref 6.0–8.3)

## 2019-09-30 MED ORDER — METHADONE HCL 10 MG PO TABS: 20.0000 mg | ORAL_TABLET | Freq: Three times a day (TID) | ORAL | 0 refills | Status: DC | PRN

## 2019-09-30 MED ORDER — METFORMIN HCL 500 MG PO TABS: 500.0000 mg | ORAL_TABLET | Freq: Every day | ORAL | 3 refills | Status: DC

## 2019-09-30 MED ORDER — GLIMEPIRIDE 4 MG PO TABS: 4.0000 mg | ORAL_TABLET | Freq: Two times a day (BID) | ORAL | 3 refills | Status: DC

## 2019-09-30 NOTE — Assessment & Plan Note (Signed)
BP OK at home

## 2019-09-30 NOTE — Assessment & Plan Note (Signed)
Labs

## 2019-09-30 NOTE — Progress Notes (Signed)
Subjective:  Patient ID: Gabriel Erickson, male    DOB: 1941-06-27  Age: 79 y.o. MRN: [id number]  CC: No chief complaint on file.   HPI Gabriel Erickson presents for HTN, DM, LBP [place] had COVID  BP OK at home  Outpatient Medications Prior to Visit  Medication Sig Dispense Refill  . Alcohol Swabs PADS Use to clean finger before checking blood sugar level BID 200 each 3  . b complex vitamins tablet Take 1 tablet by mouth daily.      . Blood Glucose Calibration (TRUE METRIX LEVEL 2) Normal SOLN Use to calibrate meter 1 each 0  . Blood Glucose Monitoring Suppl (TRUE METRIX METER) w/Device KIT Use to check blood sugar BID 1 kit 0  . Cholecalciferol 1000 UNITS tablet Take 1,000 Units by mouth daily.      . diazepam (VALIUM) 5 MG tablet Take 1 tablet (5 mg total) by mouth 2 (two) times daily as needed for muscle spasms (spasms). For cramps or insomnia 60 tablet 3  . famotidine (PEPCID) 40 MG tablet Take 1 tablet (40 mg total) by mouth daily. 90 tablet 3  . fluticasone (FLONASE) 50 MCG/ACT nasal spray Place 2 sprays into the nose daily. 16 g 11  . glimepiride (AMARYL) 4 MG tablet Take 4 mg by mouth 2 (two) times daily.    Marland Kitchen glucose blood test strip Use to check blood sugar BID 200 each 3  . metFORMIN (GLUCOPHAGE) 500 MG tablet Take 1 tablet (500 mg total) by mouth daily with breakfast. 30 tablet 11  . methadone (DOLOPHINE) 10 MG tablet Take 2 tablets (20 mg total) by mouth every 8 (eight) hours as needed for severe pain. For pain M54.5 180 tablet 0  . methadone (DOLOPHINE) 10 MG tablet Take 2 tablets (20 mg total) by mouth every 8 (eight) hours as needed for severe pain. 180 tablet 0  . methadone (DOLOPHINE) 10 MG tablet Take 2 tablets (20 mg total) by mouth every 8 (eight) hours as needed for severe pain. 180 tablet 0  . nystatin cream (MYCOSTATIN) Apply 1 application topically 2 (two) times daily. 30 g 1  . repaglinide (PRANDIN) 2 MG tablet Take 1 tablet (2 mg total) by mouth 3 (three) times daily  before meals. 90 tablet 11  . TRUEplus Lancets 30G MISC Use to check blood sugar BID 200 each 3  . cefUROXime (CEFTIN) 500 MG tablet Take 1 tablet (500 mg total) by mouth 2 (two) times daily. 20 tablet 0  . doxycycline (VIBRA-TABS) 100 MG tablet Take 1 tablet (100 mg total) by mouth 2 (two) times daily. 20 tablet 0   No facility-administered medications prior to visit.    ROS: Review of Systems  Constitutional: Negative for appetite change, fatigue and unexpected weight change.  HENT: Negative for congestion, nosebleeds, sneezing, sore throat and trouble swallowing.   Eyes: Negative for itching and visual disturbance.  Respiratory: Negative for cough.   Cardiovascular: Negative for chest pain, palpitations and leg swelling.  Gastrointestinal: Negative for abdominal distention, blood in stool, diarrhea and nausea.  Genitourinary: Negative for frequency and hematuria.  Musculoskeletal: Positive for back pain. Negative for gait problem, joint swelling and neck pain.  Skin: Negative for rash.  Neurological: Negative for dizziness, tremors, speech difficulty and weakness.  Psychiatric/Behavioral: Negative for agitation, dysphoric mood, sleep disturbance and suicidal ideas. The patient is not nervous/anxious.     Objective:  BP (!) 152/76 (BP Location: Left Arm, Patient Position: Sitting, Cuff Size: Normal)  Pulse 88   Temp 98.4 F (36.9 C) (Oral)   Ht 6' (1.829 m)   Wt 169 lb (76.7 kg)   SpO2 98%   BMI 22.92 kg/m   BP Readings from Last 3 Encounters:  09/30/19 (!) 152/76  07/01/19 (!) 150/88  04/01/19 (!) 148/84    Wt Readings from Last 3 Encounters:  09/30/19 169 lb (76.7 kg)  07/01/19 173 lb (78.5 kg)  04/01/19 171 lb (77.6 kg)    Physical Exam Constitutional:      General: He is not in acute distress.    Appearance: He is well-developed.     Comments: NAD  Eyes:     Conjunctiva/sclera: Conjunctivae normal.     Pupils: Pupils are equal, round, and reactive to light.   Neck:     Thyroid: No thyromegaly.     Vascular: No JVD.  Cardiovascular:     Rate and Rhythm: Normal rate and regular rhythm.     Heart sounds: Normal heart sounds. No murmur. No friction rub. No gallop.   Pulmonary:     Effort: Pulmonary effort is normal. No respiratory distress.     Breath sounds: Normal breath sounds. No wheezing or rales.  Chest:     Chest wall: No tenderness.  Abdominal:     General: Bowel sounds are normal. There is no distension.     Palpations: Abdomen is soft. There is no mass.     Tenderness: There is no abdominal tenderness. There is no guarding or rebound.  Musculoskeletal:        General: Tenderness present. Normal range of motion.     Cervical back: Normal range of motion.  Lymphadenopathy:     Cervical: No cervical adenopathy.  Skin:    General: Skin is warm and dry.     Findings: No rash.  Neurological:     Mental Status: He is alert and oriented to person, place, and time.     Cranial Nerves: No cranial nerve deficit.     Motor: No abnormal muscle tone.     Coordination: Coordination normal.     Gait: Gait normal.     Deep Tendon Reflexes: Reflexes are normal and symmetric.  Psychiatric:        Behavior: Behavior normal.        Thought Content: Thought content normal.        Judgment: Judgment normal.     Lab Results  Component Value Date   WBC 4.9 07/01/2019   HGB 15.7 07/01/2019   HCT 45.6 07/01/2019   PLT 214.0 07/01/2019   GLUCOSE 113 (H) 07/01/2019   CHOL 220 (H) 07/01/2019   TRIG 188.0 (H) 07/01/2019   HDL 37.60 (L) 07/01/2019   LDLDIRECT 142.3 04/12/2010   LDLCALC 145 (H) 07/01/2019   ALT 23 07/01/2019   AST 21 07/01/2019   NA 139 07/01/2019   K 4.2 07/01/2019   CL 101 07/01/2019   CREATININE 0.77 07/01/2019   BUN 8 07/01/2019   CO2 31 07/01/2019   TSH 0.51 07/01/2019   PSA 1.86 07/01/2019   HGBA1C 7.3 (H) 07/01/2019   MICROALBUR <0.7 06/19/2017    CT Chest W Contrast  Result Date: 05/25/2018 CLINICAL DATA:   Follow-up colon cancer. EXAM: CT CHEST, ABDOMEN, AND PELVIS WITH CONTRAST TECHNIQUE: Multidetector CT imaging of the chest, abdomen and pelvis was performed following the standard protocol during bolus administration of intravenous contrast. CONTRAST:  159m OMNIPAQUE IOHEXOL 300 MG/ML  SOLN COMPARISON:  05/24/2017 FINDINGS: CT CHEST  FINDINGS Cardiovascular: Normal heart size.  No pericardial effusion. Mediastinum/Nodes: Multinodular thyroid gland is again noted. Dominant nodule in the right lobe measures 2.2 cm, image 9/2. The trachea appears patent and is midline. Normal appearance of the esophagus. No supraclavicular or axillary adenopathy. No mediastinal or hilar adenopathy. Lungs/Pleura: No pleural effusion. Stable calcified nodule in the right upper lobe measuring 6 mm, image 49/4. Unchanged. Small anterior right upper lobe lung nodule measuring 5 mm is stable, image 66/4. New right upper lobe lung nodule measures 2 mm, image 64/4. Right upper lobe perifissural nodule is stable measuring 5 mm, image 76/4. No additional pulmonary nodules identified. Musculoskeletal: No aggressive lytic or sclerotic bone lesions identified. CT ABDOMEN PELVIS FINDINGS Hepatobiliary: There are no suspicious liver lesions. The gallbladder appears normal. No biliary ductal dilatation. Pancreas: Scattered pancreatic calcifications are noted which may be the sequelae of chronic pancreatitis. No pancreatic inflammation, main duct dilatation or mass noted. Spleen: Spleen appears normal. Adrenals/Urinary Tract: The adrenal glands are unremarkable. Right kidney is normal. Small low-density structure within the inferior pole of left kidney measures 5 mm and is too small to characterize. Urinary bladder appears within normal limits. Stomach/Bowel: Stomach is normal. The small bowel loops have a normal caliber. Postoperative changes from right hemicolectomy identified. Enterocolonic anastomosis. No specific findings at the anastomosis to  suggest local tumor recurrence or complication. No pathologic dilatation of the remaining colon. Distal colonic diverticulosis without acute inflammation. Vascular/Lymphatic: Normal appearance of the abdominal aorta. No enlarged retroperitoneal or mesenteric adenopathy. No enlarged pelvic or inguinal lymph nodes. Reproductive: Prostate is unremarkable. Other: No abdominal wall hernia or abnormality. No abdominopelvic ascites. Musculoskeletal: No acute or significant osseous findings. IMPRESSION: 1. No significant interval change compared with 05/24/2017. No findings highly concerning for recurrent or metastatic disease status post right hemicolectomy. 2. Previously noted calcified and noncalcified nodules are stable when compared with the previous exam. A new tiny nodule within the right upper lobe measures 2-3 mm, nonspecific. This warrants attention on follow-up imaging. 3. Calcifications within the pancreas which may reflect chronic pancreatitis. 4. Multinodular thyroid gland with dominant nodule in the right lobe. Consider further evaluation with thyroid ultrasound. If patient is clinically hyperthyroid, consider nuclear medicine thyroid uptake and scan. Electronically Signed   By: Kerby Moors M.D.   On: 05/25/2018 16:48   CT Abdomen Pelvis W Contrast  Result Date: 05/25/2018 CLINICAL DATA:  Follow-up colon cancer. EXAM: CT CHEST, ABDOMEN, AND PELVIS WITH CONTRAST TECHNIQUE: Multidetector CT imaging of the chest, abdomen and pelvis was performed following the standard protocol during bolus administration of intravenous contrast. CONTRAST:  139m OMNIPAQUE IOHEXOL 300 MG/ML  SOLN COMPARISON:  05/24/2017 FINDINGS: CT CHEST FINDINGS Cardiovascular: Normal heart size.  No pericardial effusion. Mediastinum/Nodes: Multinodular thyroid gland is again noted. Dominant nodule in the right lobe measures 2.2 cm, image 9/2. The trachea appears patent and is midline. Normal appearance of the esophagus. No  supraclavicular or axillary adenopathy. No mediastinal or hilar adenopathy. Lungs/Pleura: No pleural effusion. Stable calcified nodule in the right upper lobe measuring 6 mm, image 49/4. Unchanged. Small anterior right upper lobe lung nodule measuring 5 mm is stable, image 66/4. New right upper lobe lung nodule measures 2 mm, image 64/4. Right upper lobe perifissural nodule is stable measuring 5 mm, image 76/4. No additional pulmonary nodules identified. Musculoskeletal: No aggressive lytic or sclerotic bone lesions identified. CT ABDOMEN PELVIS FINDINGS Hepatobiliary: There are no suspicious liver lesions. The gallbladder appears normal. No biliary ductal dilatation. Pancreas: Scattered pancreatic  calcifications are noted which may be the sequelae of chronic pancreatitis. No pancreatic inflammation, main duct dilatation or mass noted. Spleen: Spleen appears normal. Adrenals/Urinary Tract: The adrenal glands are unremarkable. Right kidney is normal. Small low-density structure within the inferior pole of left kidney measures 5 mm and is too small to characterize. Urinary bladder appears within normal limits. Stomach/Bowel: Stomach is normal. The small bowel loops have a normal caliber. Postoperative changes from right hemicolectomy identified. Enterocolonic anastomosis. No specific findings at the anastomosis to suggest local tumor recurrence or complication. No pathologic dilatation of the remaining colon. Distal colonic diverticulosis without acute inflammation. Vascular/Lymphatic: Normal appearance of the abdominal aorta. No enlarged retroperitoneal or mesenteric adenopathy. No enlarged pelvic or inguinal lymph nodes. Reproductive: Prostate is unremarkable. Other: No abdominal wall hernia or abnormality. No abdominopelvic ascites. Musculoskeletal: No acute or significant osseous findings. IMPRESSION: 1. No significant interval change compared with 05/24/2017. No findings highly concerning for recurrent or  metastatic disease status post right hemicolectomy. 2. Previously noted calcified and noncalcified nodules are stable when compared with the previous exam. A new tiny nodule within the right upper lobe measures 2-3 mm, nonspecific. This warrants attention on follow-up imaging. 3. Calcifications within the pancreas which may reflect chronic pancreatitis. 4. Multinodular thyroid gland with dominant nodule in the right lobe. Consider further evaluation with thyroid ultrasound. If patient is clinically hyperthyroid, consider nuclear medicine thyroid uptake and scan. Electronically Signed   By: Kerby Moors M.D.   On: 05/25/2018 16:48    Assessment & Plan:   There are no diagnoses linked to this encounter.   No orders of the defined types were placed in this encounter.    Follow-up: No follow-ups on file.  Walker Kehr, MD

## 2019-09-30 NOTE — Addendum Note (Signed)
Addended by: Cresenciano Lick on: 09/30/2019 01:58 PM   Modules accepted: Orders

## 2019-09-30 NOTE — Assessment & Plan Note (Signed)
On Rx 

## 2019-10-01 LAB — HEMOGLOBIN A1C: Hgb A1c MFr Bld: 7.9 % — ABNORMAL HIGH (ref 4.6–6.5)

## 2019-11-11 DIAGNOSIS — E119 Type 2 diabetes mellitus without complications: Secondary | ICD-10-CM | POA: Diagnosis not present

## 2019-11-11 DIAGNOSIS — Z7984 Long term (current) use of oral hypoglycemic drugs: Secondary | ICD-10-CM | POA: Diagnosis not present

## 2019-11-11 DIAGNOSIS — H26493 Other secondary cataract, bilateral: Secondary | ICD-10-CM | POA: Diagnosis not present

## 2019-11-11 DIAGNOSIS — H43393 Other vitreous opacities, bilateral: Secondary | ICD-10-CM | POA: Diagnosis not present

## 2019-11-11 DIAGNOSIS — Z961 Presence of intraocular lens: Secondary | ICD-10-CM | POA: Diagnosis not present

## 2019-11-11 DIAGNOSIS — H524 Presbyopia: Secondary | ICD-10-CM | POA: Diagnosis not present

## 2019-12-04 ENCOUNTER — Other Ambulatory Visit: Payer: Self-pay | Admitting: Internal Medicine

## 2019-12-04 NOTE — Telephone Encounter (Signed)
New message:    1.Medication Requested: methadone (DOLOPHINE) 10 MG tablet 2. Pharmacy (Name, [address], [place]): [place], [place] 3. On Med List: Yes  4. Last Visit with PCP:   5. Next visit date with PCP:  Pt is calling and states he needs this prescription sent to this pharmacy due to the [facility] refusing to fill it for the patient. Please advise. Agent: Please be advised that RX refills may take up to 3 business days. We ask that you follow-up with your pharmacy.

## 2019-12-04 NOTE — Telephone Encounter (Signed)
Updated pharmacy.../lmb 

## 2019-12-04 NOTE — Telephone Encounter (Signed)
Okay, but I cannot find this pharmacy in the system.  Thanks

## 2019-12-05 MED ORDER — METHADONE HCL 10 MG PO TABS: 20.0000 mg | ORAL_TABLET | Freq: Three times a day (TID) | ORAL | 0 refills | Status: DC | PRN

## 2019-12-06 ENCOUNTER — Telehealth: Payer: Self-pay

## 2019-12-06 NOTE — Telephone Encounter (Signed)
Done. Thanks.

## 2019-12-06 NOTE — Telephone Encounter (Signed)
Key: [id number]

## 2019-12-09 ENCOUNTER — Telehealth: Payer: Self-pay

## 2019-12-09 NOTE — Telephone Encounter (Signed)
Pharmacy informed PA was approved until 07/17/20. They state they will refund patient's money.

## 2019-12-09 NOTE — Telephone Encounter (Deleted)
Error

## 2019-12-09 NOTE — Telephone Encounter (Signed)
  New message    Prior authorization on diazepam (VALIUM) 5 MG tablet the cost was $ 26.84 co-pay with [facility] Rossetti is  $ 10.00   The MD will need to call [employer] and the pharmacy will refund his money.   [place], [place]

## 2019-12-11 ENCOUNTER — Other Ambulatory Visit: Payer: Self-pay | Admitting: Internal Medicine

## 2019-12-28 ENCOUNTER — Other Ambulatory Visit: Payer: Self-pay | Admitting: Internal Medicine

## 2019-12-30 ENCOUNTER — Encounter: Payer: Self-pay | Admitting: Internal Medicine

## 2019-12-30 ENCOUNTER — Ambulatory Visit: Payer: Medicare PPO | Admitting: Internal Medicine

## 2019-12-30 ENCOUNTER — Other Ambulatory Visit: Payer: Self-pay

## 2019-12-30 DIAGNOSIS — E119 Type 2 diabetes mellitus without complications: Secondary | ICD-10-CM

## 2019-12-30 DIAGNOSIS — M5440 Lumbago with sciatica, unspecified side: Secondary | ICD-10-CM

## 2019-12-30 DIAGNOSIS — K219 Gastro-esophageal reflux disease without esophagitis: Secondary | ICD-10-CM

## 2019-12-30 DIAGNOSIS — G8929 Other chronic pain: Secondary | ICD-10-CM

## 2019-12-30 DIAGNOSIS — E1142 Type 2 diabetes mellitus with diabetic polyneuropathy: Secondary | ICD-10-CM | POA: Diagnosis not present

## 2019-12-30 LAB — BASIC METABOLIC PANEL
BUN: 10 mg/dL (ref 6–23)
CO2: 31 mEq/L (ref 19–32)
Calcium: 9.5 mg/dL (ref 8.4–10.5)
Chloride: 102 mEq/L (ref 96–112)
Creatinine, Ser: 0.75 mg/dL (ref 0.40–1.50)
GFR: 101.07 mL/min (ref >60.00)
Glucose, Bld: 134 mg/dL — ABNORMAL HIGH (ref 70–99)
Potassium: 4.2 mEq/L (ref 3.5–5.1)
Sodium: 138 mEq/L (ref 135–145)

## 2019-12-30 LAB — CBC WITH DIFFERENTIAL/PLATELET
Basophils Absolute: 0 10*3/uL (ref 0.0–0.1)
Basophils Relative: 0.8 % (ref 0.0–3.0)
Eosinophils Absolute: 0.1 10*3/uL (ref 0.0–0.7)
Eosinophils Relative: 1.5 % (ref 0.0–5.0)
HCT: 44.6 % (ref 39.0–52.0)
Hemoglobin: 15.2 g/dL (ref 13.0–17.0)
Lymphocytes Relative: 31.8 % (ref 12.0–46.0)
Lymphs Abs: 1.7 10*3/uL (ref 0.7–4.0)
MCHC: 34 g/dL (ref 30.0–36.0)
MCV: 97.2 fl (ref 78.0–100.0)
Monocytes Absolute: 0.7 10*3/uL (ref 0.1–1.0)
Monocytes Relative: 12.5 % — ABNORMAL HIGH (ref 3.0–12.0)
Neutro Abs: 2.9 10*3/uL (ref 1.4–7.7)
Neutrophils Relative %: 53.4 % (ref 43.0–77.0)
Platelets: 218 10*3/uL (ref 150.0–400.0)
RBC: 4.59 Mil/uL (ref 4.22–5.81)
RDW: 12.6 % (ref 11.5–15.5)
WBC: 5.5 10*3/uL (ref 4.0–10.5)

## 2019-12-30 LAB — HEMOGLOBIN A1C: Hgb A1c MFr Bld: 7.1 % — ABNORMAL HIGH (ref 4.6–6.5)

## 2019-12-30 LAB — HEPATIC FUNCTION PANEL
ALT: 22 U/L (ref 0–53)
AST: 21 U/L (ref 0–37)
Albumin: 4.4 g/dL (ref 3.5–5.2)
Alkaline Phosphatase: 65 U/L (ref 39–117)
Bilirubin, Direct: 0.1 mg/dL (ref 0.0–0.3)
Total Bilirubin: 0.5 mg/dL (ref 0.2–1.2)
Total Protein: 7.6 g/dL (ref 6.0–8.3)

## 2019-12-30 MED ORDER — METHADONE HCL 10 MG PO TABS: 20.0000 mg | ORAL_TABLET | Freq: Three times a day (TID) | ORAL | 0 refills | Status: DC | PRN

## 2019-12-30 NOTE — Addendum Note (Signed)
Addended by: Trenda Moots on: 2/86/7519 11:32 AM   Modules accepted: Orders

## 2019-12-30 NOTE — Progress Notes (Signed)
Subjective:  Patient ID: Gabriel Erickson, male    DOB: Jan 17, 1941  Age: 79 y.o. MRN: [id number]  CC: No chief complaint on file.   HPI Gabriel Erickson presents for a chronic LBP, DM, GERD f/u  Outpatient Medications Prior to Visit  Medication Sig Dispense Refill  . Alcohol Swabs PADS Use to clean finger before checking blood sugar level BID 200 each 3  . b complex vitamins tablet Take 1 tablet by mouth daily.      . Blood Glucose Calibration (TRUE METRIX LEVEL 2) Normal SOLN Use to calibrate meter 1 each 0  . Blood Glucose Monitoring Suppl (TRUE METRIX METER) w/Device KIT Use to check blood sugar BID 1 kit 0  . Cholecalciferol 1000 UNITS tablet Take 1,000 Units by mouth daily.      . diazepam (VALIUM) 5 MG tablet Take 1 tablet (5 mg total) by mouth 2 (two) times daily as needed for muscle spasms (spasms). For cramps or insomnia 60 tablet 3  . famotidine (PEPCID) 40 MG tablet Take 1 tablet (40 mg total) by mouth daily. 90 tablet 3  . fluticasone (FLONASE) 50 MCG/ACT nasal spray Place 2 sprays into the nose daily. 16 g 11  . glimepiride (AMARYL) 4 MG tablet TAKE 1 TABLET BY MOUTH TWICE DAILY 180 tablet 3  . glucose blood test strip Use to check blood sugar BID 200 each 3  . metFORMIN (GLUCOPHAGE) 500 MG tablet Take 1 tablet (500 mg total) by mouth daily with breakfast. 90 tablet 3  . methadone (DOLOPHINE) 10 MG tablet Take 2 tablets (20 mg total) by mouth every 8 (eight) hours as needed for severe pain. 180 tablet 0  . methadone (DOLOPHINE) 10 MG tablet Take 2 tablets (20 mg total) by mouth every 8 (eight) hours as needed for severe pain. 180 tablet 0  . methadone (DOLOPHINE) 10 MG tablet Take 2 tablets (20 mg total) by mouth every 8 (eight) hours as needed for severe pain. For pain M54.5 180 tablet 0  . nystatin cream (MYCOSTATIN) Apply 1 application topically 2 (two) times daily. 30 g 1  . repaglinide (PRANDIN) 2 MG tablet Take 1 tablet (2 mg total) by mouth 3 (three) times daily before meals. 90  tablet 11  . TRUEplus Lancets 30G MISC Use to check blood sugar BID 200 each 3   No facility-administered medications prior to visit.    ROS: Review of Systems  Constitutional: Negative for appetite change, fatigue and unexpected weight change.  HENT: Negative for congestion, nosebleeds, sneezing, sore throat and trouble swallowing.   Eyes: Negative for itching and visual disturbance.  Respiratory: Negative for cough.   Cardiovascular: Negative for chest pain, palpitations and leg swelling.  Gastrointestinal: Negative for abdominal distention, blood in stool, diarrhea and nausea.  Genitourinary: Negative for frequency and hematuria.  Musculoskeletal: Positive for back pain and gait problem. Negative for joint swelling and neck pain.  Skin: Negative for rash.  Neurological: Negative for dizziness, tremors, speech difficulty and weakness.  Psychiatric/Behavioral: Negative for agitation, dysphoric mood, sleep disturbance and suicidal ideas. The patient is not nervous/anxious.     Objective:  BP (!) 172/90 (BP Location: Left Arm, Patient Position: Sitting, Cuff Size: Normal)   Pulse 77   Temp (!) 97.5 F (36.4 C) (Oral)   Ht 6' (1.829 m)   Wt 168 lb (76.2 kg)   SpO2 98%   BMI 22.78 kg/m   BP Readings from Last 3 Encounters:  12/30/19 (!) 172/90  09/30/19 Marland Kitchen)  152/76  07/01/19 (!) 150/88    Wt Readings from Last 3 Encounters:  12/30/19 168 lb (76.2 kg)  09/30/19 169 lb (76.7 kg)  07/01/19 173 lb (78.5 kg)    Physical Exam Constitutional:      General: He is not in acute distress.    Appearance: He is well-developed.     Comments: NAD  Eyes:     Conjunctiva/sclera: Conjunctivae normal.     Pupils: Pupils are equal, round, and reactive to light.  Neck:     Thyroid: No thyromegaly.     Vascular: No JVD.  Cardiovascular:     Rate and Rhythm: Normal rate and regular rhythm.     Heart sounds: Normal heart sounds. No murmur heard.  No friction rub. No gallop.    Pulmonary:     Effort: Pulmonary effort is normal. No respiratory distress.     Breath sounds: Normal breath sounds. No wheezing or rales.  Chest:     Chest wall: No tenderness.  Abdominal:     General: Bowel sounds are normal. There is no distension.     Palpations: Abdomen is soft. There is no mass.     Tenderness: There is no abdominal tenderness. There is no guarding or rebound.  Musculoskeletal:        General: No tenderness. Normal range of motion.     Cervical back: Normal range of motion.  Lymphadenopathy:     Cervical: No cervical adenopathy.  Skin:    General: Skin is warm and dry.     Findings: No rash.  Neurological:     Mental Status: He is alert and oriented to person, place, and time.     Cranial Nerves: No cranial nerve deficit.     Motor: Weakness present. No abnormal muscle tone.     Coordination: Coordination normal.     Gait: Gait abnormal.     Deep Tendon Reflexes: Reflexes are normal and symmetric.  Psychiatric:        Behavior: Behavior normal.        Thought Content: Thought content normal.        Judgment: Judgment normal.    LS spine tender Weak legs  Lab Results  Component Value Date   WBC 5.2 09/30/2019   HGB 14.4 09/30/2019   HCT 41.8 09/30/2019   PLT 225.0 09/30/2019   GLUCOSE 111 (H) 09/30/2019   CHOL 220 (H) 07/01/2019   TRIG 188.0 (H) 07/01/2019   HDL 37.60 (L) 07/01/2019   LDLDIRECT 142.3 04/12/2010   LDLCALC 145 (H) 07/01/2019   ALT 28 09/30/2019   AST 28 09/30/2019   NA 138 09/30/2019   K 4.4 09/30/2019   CL 104 09/30/2019   CREATININE 0.68 09/30/2019   BUN 10 09/30/2019   CO2 30 09/30/2019   TSH 0.51 07/01/2019   PSA 1.86 07/01/2019   HGBA1C 7.9 (H) 09/30/2019   MICROALBUR <0.7 06/19/2017    CT Chest W Contrast  Result Date: 05/25/2018 CLINICAL DATA:  Follow-up colon cancer. EXAM: CT CHEST, ABDOMEN, AND PELVIS WITH CONTRAST TECHNIQUE: Multidetector CT imaging of the chest, abdomen and pelvis was performed following  the standard protocol during bolus administration of intravenous contrast. CONTRAST:  130m OMNIPAQUE IOHEXOL 300 MG/ML  SOLN COMPARISON:  05/24/2017 FINDINGS: CT CHEST FINDINGS Cardiovascular: Normal heart size.  No pericardial effusion. Mediastinum/Nodes: Multinodular thyroid gland is again noted. Dominant nodule in the right lobe measures 2.2 cm, image 9/2. The trachea appears patent and is midline. Normal appearance of the  esophagus. No supraclavicular or axillary adenopathy. No mediastinal or hilar adenopathy. Lungs/Pleura: No pleural effusion. Stable calcified nodule in the right upper lobe measuring 6 mm, image 49/4. Unchanged. Small anterior right upper lobe lung nodule measuring 5 mm is stable, image 66/4. New right upper lobe lung nodule measures 2 mm, image 64/4. Right upper lobe perifissural nodule is stable measuring 5 mm, image 76/4. No additional pulmonary nodules identified. Musculoskeletal: No aggressive lytic or sclerotic bone lesions identified. CT ABDOMEN PELVIS FINDINGS Hepatobiliary: There are no suspicious liver lesions. The gallbladder appears normal. No biliary ductal dilatation. Pancreas: Scattered pancreatic calcifications are noted which may be the sequelae of chronic pancreatitis. No pancreatic inflammation, main duct dilatation or mass noted. Spleen: Spleen appears normal. Adrenals/Urinary Tract: The adrenal glands are unremarkable. Right kidney is normal. Small low-density structure within the inferior pole of left kidney measures 5 mm and is too small to characterize. Urinary bladder appears within normal limits. Stomach/Bowel: Stomach is normal. The small bowel loops have a normal caliber. Postoperative changes from right hemicolectomy identified. Enterocolonic anastomosis. No specific findings at the anastomosis to suggest local tumor recurrence or complication. No pathologic dilatation of the remaining colon. Distal colonic diverticulosis without acute inflammation.  Vascular/Lymphatic: Normal appearance of the abdominal aorta. No enlarged retroperitoneal or mesenteric adenopathy. No enlarged pelvic or inguinal lymph nodes. Reproductive: Prostate is unremarkable. Other: No abdominal wall hernia or abnormality. No abdominopelvic ascites. Musculoskeletal: No acute or significant osseous findings. IMPRESSION: 1. No significant interval change compared with 05/24/2017. No findings highly concerning for recurrent or metastatic disease status post right hemicolectomy. 2. Previously noted calcified and noncalcified nodules are stable when compared with the previous exam. A new tiny nodule within the right upper lobe measures 2-3 mm, nonspecific. This warrants attention on follow-up imaging. 3. Calcifications within the pancreas which may reflect chronic pancreatitis. 4. Multinodular thyroid gland with dominant nodule in the right lobe. Consider further evaluation with thyroid ultrasound. If patient is clinically hyperthyroid, consider nuclear medicine thyroid uptake and scan. Electronically Signed   By: Kerby Moors M.D.   On: 05/25/2018 16:48   CT Abdomen Pelvis W Contrast  Result Date: 05/25/2018 CLINICAL DATA:  Follow-up colon cancer. EXAM: CT CHEST, ABDOMEN, AND PELVIS WITH CONTRAST TECHNIQUE: Multidetector CT imaging of the chest, abdomen and pelvis was performed following the standard protocol during bolus administration of intravenous contrast. CONTRAST:  15m OMNIPAQUE IOHEXOL 300 MG/ML  SOLN COMPARISON:  05/24/2017 FINDINGS: CT CHEST FINDINGS Cardiovascular: Normal heart size.  No pericardial effusion. Mediastinum/Nodes: Multinodular thyroid gland is again noted. Dominant nodule in the right lobe measures 2.2 cm, image 9/2. The trachea appears patent and is midline. Normal appearance of the esophagus. No supraclavicular or axillary adenopathy. No mediastinal or hilar adenopathy. Lungs/Pleura: No pleural effusion. Stable calcified nodule in the right upper lobe measuring  6 mm, image 49/4. Unchanged. Small anterior right upper lobe lung nodule measuring 5 mm is stable, image 66/4. New right upper lobe lung nodule measures 2 mm, image 64/4. Right upper lobe perifissural nodule is stable measuring 5 mm, image 76/4. No additional pulmonary nodules identified. Musculoskeletal: No aggressive lytic or sclerotic bone lesions identified. CT ABDOMEN PELVIS FINDINGS Hepatobiliary: There are no suspicious liver lesions. The gallbladder appears normal. No biliary ductal dilatation. Pancreas: Scattered pancreatic calcifications are noted which may be the sequelae of chronic pancreatitis. No pancreatic inflammation, main duct dilatation or mass noted. Spleen: Spleen appears normal. Adrenals/Urinary Tract: The adrenal glands are unremarkable. Right kidney is normal. Small low-density structure  within the inferior pole of left kidney measures 5 mm and is too small to characterize. Urinary bladder appears within normal limits. Stomach/Bowel: Stomach is normal. The small bowel loops have a normal caliber. Postoperative changes from right hemicolectomy identified. Enterocolonic anastomosis. No specific findings at the anastomosis to suggest local tumor recurrence or complication. No pathologic dilatation of the remaining colon. Distal colonic diverticulosis without acute inflammation. Vascular/Lymphatic: Normal appearance of the abdominal aorta. No enlarged retroperitoneal or mesenteric adenopathy. No enlarged pelvic or inguinal lymph nodes. Reproductive: Prostate is unremarkable. Other: No abdominal wall hernia or abnormality. No abdominopelvic ascites. Musculoskeletal: No acute or significant osseous findings. IMPRESSION: 1. No significant interval change compared with 05/24/2017. No findings highly concerning for recurrent or metastatic disease status post right hemicolectomy. 2. Previously noted calcified and noncalcified nodules are stable when compared with the previous exam. A new tiny nodule  within the right upper lobe measures 2-3 mm, nonspecific. This warrants attention on follow-up imaging. 3. Calcifications within the pancreas which may reflect chronic pancreatitis. 4. Multinodular thyroid gland with dominant nodule in the right lobe. Consider further evaluation with thyroid ultrasound. If patient is clinically hyperthyroid, consider nuclear medicine thyroid uptake and scan. Electronically Signed   By: Kerby Moors M.D.   On: 05/25/2018 16:48    Assessment & Plan:    Walker Kehr, MD

## 2019-12-30 NOTE — Assessment & Plan Note (Signed)
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 addiction risk, apnea etc) and complications (i.e. Somnolence, constipation and others) were explained to the patient and were aknowledged.  Diazepam Benzo use risk is discussed - taking very little

## 2019-12-30 NOTE — Assessment & Plan Note (Signed)
Pepcid?

## 2019-12-30 NOTE — Assessment & Plan Note (Addendum)
Chronic - on phlebotomies Keep Hct <45%  CBC

## 2019-12-30 NOTE — Assessment & Plan Note (Addendum)
Glimepiride,  Metformin

## 2019-12-31 ENCOUNTER — Ambulatory Visit: Payer: Medicare PPO | Admitting: Internal Medicine

## 2020-01-01 ENCOUNTER — Other Ambulatory Visit: Payer: Self-pay

## 2020-01-01 NOTE — Telephone Encounter (Signed)
[place] Controlled Database Checked Last filled: 07/01/2019 60 LOV w/you: 12/30/2019 Next appt w/you: 03/31/2020

## 2020-01-03 MED ORDER — DIAZEPAM 5 MG PO TABS: 5.0000 mg | ORAL_TABLET | Freq: Two times a day (BID) | ORAL | 3 refills | Status: DC | PRN

## 2020-03-02 ENCOUNTER — Other Ambulatory Visit: Payer: Self-pay | Admitting: Internal Medicine

## 2020-03-27 ENCOUNTER — Other Ambulatory Visit: Payer: Self-pay | Admitting: Internal Medicine

## 2020-03-31 ENCOUNTER — Ambulatory Visit: Payer: Medicare PPO | Admitting: Internal Medicine

## 2020-03-31 ENCOUNTER — Other Ambulatory Visit: Payer: Self-pay

## 2020-03-31 ENCOUNTER — Encounter: Payer: Self-pay | Admitting: Internal Medicine

## 2020-03-31 DIAGNOSIS — G8929 Other chronic pain: Secondary | ICD-10-CM

## 2020-03-31 DIAGNOSIS — R030 Elevated blood-pressure reading, without diagnosis of hypertension: Secondary | ICD-10-CM

## 2020-03-31 DIAGNOSIS — N529 Male erectile dysfunction, unspecified: Secondary | ICD-10-CM

## 2020-03-31 DIAGNOSIS — M5440 Lumbago with sciatica, unspecified side: Secondary | ICD-10-CM | POA: Diagnosis not present

## 2020-03-31 DIAGNOSIS — E1142 Type 2 diabetes mellitus with diabetic polyneuropathy: Secondary | ICD-10-CM | POA: Diagnosis not present

## 2020-03-31 MED ORDER — METHADONE HCL 10 MG PO TABS: 20.0000 mg | ORAL_TABLET | Freq: Two times a day (BID) | ORAL | 0 refills | Status: DC | PRN

## 2020-03-31 MED ORDER — TADALAFIL 20 MG PO TABS: 10.0000 mg | ORAL_TABLET | Freq: Every day | ORAL | 2 refills | Status: DC | PRN

## 2020-03-31 NOTE — Assessment & Plan Note (Signed)
Try Cialis

## 2020-03-31 NOTE — Assessment & Plan Note (Signed)
BP ok at home -- SBP 140 or less

## 2020-03-31 NOTE — Assessment & Plan Note (Signed)
reducing the dose per Gabriel Erickson's request w/caution

## 2020-03-31 NOTE — Progress Notes (Signed)
Subjective:  Patient ID: Gabriel Erickson, male    DOB: Jan 15, 1941  Age: 79 y.o. MRN: [id number]  CC: No chief complaint on file.   HPI Gabriel Erickson presents for LBP - we are trying to reduce Methadone daily dose F/u DM, HTN BP ok at home -- SBP 140 or less C/o ED  Outpatient Medications Prior to Visit  Medication Sig Dispense Refill  . Alcohol Swabs PADS Use to clean finger before checking blood sugar level BID 200 each 3  . b complex vitamins tablet Take 1 tablet by mouth daily.      . Blood Glucose Calibration (TRUE METRIX LEVEL 2) Normal SOLN Use to calibrate meter 1 each 0  . Blood Glucose Monitoring Suppl (TRUE METRIX METER) w/Device KIT Use to check blood sugar BID 1 kit 0  . Cholecalciferol 1000 UNITS tablet Take 1,000 Units by mouth daily.      . diazepam (VALIUM) 5 MG tablet TAKE ONE TABLET TWICE DAILY AS NEEDED FOR muscle spasms, cramps, or insomnia 60 tablet 2  . famotidine (PEPCID) 40 MG tablet Take 1 tablet (40 mg total) by mouth daily. 90 tablet 3  . fluticasone (FLONASE) 50 MCG/ACT nasal spray Place 2 sprays into the nose daily. 16 g 11  . glimepiride (AMARYL) 4 MG tablet TAKE 1 TABLET BY MOUTH TWICE DAILY 180 tablet 3  . glucose blood test strip Use to check blood sugar BID 200 each 3  . metFORMIN (GLUCOPHAGE) 500 MG tablet Take 1 tablet (500 mg total) by mouth daily with breakfast. 90 tablet 3  . methadone (DOLOPHINE) 10 MG tablet Take 2 tablets (20 mg total) by mouth every 8 (eight) hours as needed for severe pain. 180 tablet 0  . methadone (DOLOPHINE) 10 MG tablet Take 2 tablets (20 mg total) by mouth every 8 (eight) hours as needed for severe pain. For pain M54.5 180 tablet 0  . methadone (DOLOPHINE) 10 MG tablet Take 20 mg every 8 hours as needed pain 180 tablet 0  . nystatin cream (MYCOSTATIN) Apply 1 application topically 2 (two) times daily. 30 g 1  . TRUEplus Lancets 30G MISC Use to check blood sugar BID 200 each 3   No facility-administered medications prior to  visit.    ROS: Review of Systems  Constitutional: Positive for fatigue. Negative for appetite change and unexpected weight change.  HENT: Negative for congestion, nosebleeds, sneezing, sore throat and trouble swallowing.   Eyes: Negative for itching and visual disturbance.  Respiratory: Negative for cough.   Cardiovascular: Negative for chest pain, palpitations and leg swelling.  Gastrointestinal: Negative for abdominal distention, blood in stool, diarrhea and nausea.  Genitourinary: Negative for frequency and hematuria.  Musculoskeletal: Positive for back pain and gait problem. Negative for joint swelling and neck pain.  Skin: Negative for rash.  Neurological: Positive for weakness. Negative for dizziness, tremors and speech difficulty.  Psychiatric/Behavioral: Negative for agitation, dysphoric mood, sleep disturbance and suicidal ideas. The patient is not nervous/anxious.     Objective:  BP (!) 180/78 (BP Location: Right Arm, Patient Position: Sitting, Cuff Size: Large)   Pulse 77   Temp 98.3 F (36.8 C) (Oral)   Ht 6' (1.829 m)   Wt 170 lb (77.1 kg)   SpO2 98%   BMI 23.06 kg/m   BP Readings from Last 3 Encounters:  03/31/20 (!) 180/78  12/30/19 (!) 172/90  09/30/19 (!) 152/76    Wt Readings from Last 3 Encounters:  03/31/20 170 lb (77.1  kg)  12/30/19 168 lb (76.2 kg)  09/30/19 169 lb (76.7 kg)    Physical Exam Constitutional:      General: He is not in acute distress.    Appearance: He is well-developed.     Comments: NAD  Eyes:     Conjunctiva/sclera: Conjunctivae normal.     Pupils: Pupils are equal, round, and reactive to light.  Neck:     Thyroid: No thyromegaly.     Vascular: No JVD.  Cardiovascular:     Rate and Rhythm: Normal rate and regular rhythm.     Heart sounds: Normal heart sounds. No murmur heard.  No friction rub. No gallop.   Pulmonary:     Effort: Pulmonary effort is normal. No respiratory distress.     Breath sounds: Normal breath  sounds. No wheezing or rales.  Chest:     Chest wall: No tenderness.  Abdominal:     General: Bowel sounds are normal. There is no distension.     Palpations: Abdomen is soft. There is no mass.     Tenderness: There is no abdominal tenderness. There is no guarding or rebound.  Musculoskeletal:        General: Tenderness present. Normal range of motion.     Cervical back: Normal range of motion.  Lymphadenopathy:     Cervical: No cervical adenopathy.  Skin:    General: Skin is warm and dry.     Findings: No rash.  Neurological:     Mental Status: He is alert and oriented to person, place, and time.     Cranial Nerves: No cranial nerve deficit.     Motor: Weakness present. No abnormal muscle tone.     Coordination: Coordination abnormal.     Gait: Gait abnormal.     Deep Tendon Reflexes: Reflexes are normal and symmetric.  Psychiatric:        Behavior: Behavior normal.        Thought Content: Thought content normal.        Judgment: Judgment normal.    Weak LEs Ataxic gait LS w/pain   Lab Results  Component Value Date   WBC 5.5 12/30/2019   HGB 15.2 12/30/2019   HCT 44.6 12/30/2019   PLT 218.0 12/30/2019   GLUCOSE 134 (H) 12/30/2019   CHOL 220 (H) 07/01/2019   TRIG 188.0 (H) 07/01/2019   HDL 37.60 (L) 07/01/2019   LDLDIRECT 142.3 04/12/2010   LDLCALC 145 (H) 07/01/2019   ALT 22 12/30/2019   AST 21 12/30/2019   NA 138 12/30/2019   K 4.2 12/30/2019   CL 102 12/30/2019   CREATININE 0.75 12/30/2019   BUN 10 12/30/2019   CO2 31 12/30/2019   TSH 0.51 07/01/2019   PSA 1.86 07/01/2019   HGBA1C 7.1 (H) 12/30/2019   MICROALBUR <0.7 06/19/2017    CT Chest W Contrast  Result Date: 05/25/2018 CLINICAL DATA:  Follow-up colon cancer. EXAM: CT CHEST, ABDOMEN, AND PELVIS WITH CONTRAST TECHNIQUE: Multidetector CT imaging of the chest, abdomen and pelvis was performed following the standard protocol during bolus administration of intravenous contrast. CONTRAST:  114m  OMNIPAQUE IOHEXOL 300 MG/ML  SOLN COMPARISON:  05/24/2017 FINDINGS: CT CHEST FINDINGS Cardiovascular: Normal heart size.  No pericardial effusion. Mediastinum/Nodes: Multinodular thyroid gland is again noted. Dominant nodule in the right lobe measures 2.2 cm, image 9/2. The trachea appears patent and is midline. Normal appearance of the esophagus. No supraclavicular or axillary adenopathy. No mediastinal or hilar adenopathy. Lungs/Pleura: No pleural effusion. Stable calcified  nodule in the right upper lobe measuring 6 mm, image 49/4. Unchanged. Small anterior right upper lobe lung nodule measuring 5 mm is stable, image 66/4. New right upper lobe lung nodule measures 2 mm, image 64/4. Right upper lobe perifissural nodule is stable measuring 5 mm, image 76/4. No additional pulmonary nodules identified. Musculoskeletal: No aggressive lytic or sclerotic bone lesions identified. CT ABDOMEN PELVIS FINDINGS Hepatobiliary: There are no suspicious liver lesions. The gallbladder appears normal. No biliary ductal dilatation. Pancreas: Scattered pancreatic calcifications are noted which may be the sequelae of chronic pancreatitis. No pancreatic inflammation, main duct dilatation or mass noted. Spleen: Spleen appears normal. Adrenals/Urinary Tract: The adrenal glands are unremarkable. Right kidney is normal. Small low-density structure within the inferior pole of left kidney measures 5 mm and is too small to characterize. Urinary bladder appears within normal limits. Stomach/Bowel: Stomach is normal. The small bowel loops have a normal caliber. Postoperative changes from right hemicolectomy identified. Enterocolonic anastomosis. No specific findings at the anastomosis to suggest local tumor recurrence or complication. No pathologic dilatation of the remaining colon. Distal colonic diverticulosis without acute inflammation. Vascular/Lymphatic: Normal appearance of the abdominal aorta. No enlarged retroperitoneal or mesenteric  adenopathy. No enlarged pelvic or inguinal lymph nodes. Reproductive: Prostate is unremarkable. Other: No abdominal wall hernia or abnormality. No abdominopelvic ascites. Musculoskeletal: No acute or significant osseous findings. IMPRESSION: 1. No significant interval change compared with 05/24/2017. No findings highly concerning for recurrent or metastatic disease status post right hemicolectomy. 2. Previously noted calcified and noncalcified nodules are stable when compared with the previous exam. A new tiny nodule within the right upper lobe measures 2-3 mm, nonspecific. This warrants attention on follow-up imaging. 3. Calcifications within the pancreas which may reflect chronic pancreatitis. 4. Multinodular thyroid gland with dominant nodule in the right lobe. Consider further evaluation with thyroid ultrasound. If patient is clinically hyperthyroid, consider nuclear medicine thyroid uptake and scan. Electronically Signed   By: Kerby Moors M.D.   On: 05/25/2018 16:48   CT Abdomen Pelvis W Contrast  Result Date: 05/25/2018 CLINICAL DATA:  Follow-up colon cancer. EXAM: CT CHEST, ABDOMEN, AND PELVIS WITH CONTRAST TECHNIQUE: Multidetector CT imaging of the chest, abdomen and pelvis was performed following the standard protocol during bolus administration of intravenous contrast. CONTRAST:  177m OMNIPAQUE IOHEXOL 300 MG/ML  SOLN COMPARISON:  05/24/2017 FINDINGS: CT CHEST FINDINGS Cardiovascular: Normal heart size.  No pericardial effusion. Mediastinum/Nodes: Multinodular thyroid gland is again noted. Dominant nodule in the right lobe measures 2.2 cm, image 9/2. The trachea appears patent and is midline. Normal appearance of the esophagus. No supraclavicular or axillary adenopathy. No mediastinal or hilar adenopathy. Lungs/Pleura: No pleural effusion. Stable calcified nodule in the right upper lobe measuring 6 mm, image 49/4. Unchanged. Small anterior right upper lobe lung nodule measuring 5 mm is stable,  image 66/4. New right upper lobe lung nodule measures 2 mm, image 64/4. Right upper lobe perifissural nodule is stable measuring 5 mm, image 76/4. No additional pulmonary nodules identified. Musculoskeletal: No aggressive lytic or sclerotic bone lesions identified. CT ABDOMEN PELVIS FINDINGS Hepatobiliary: There are no suspicious liver lesions. The gallbladder appears normal. No biliary ductal dilatation. Pancreas: Scattered pancreatic calcifications are noted which may be the sequelae of chronic pancreatitis. No pancreatic inflammation, main duct dilatation or mass noted. Spleen: Spleen appears normal. Adrenals/Urinary Tract: The adrenal glands are unremarkable. Right kidney is normal. Small low-density structure within the inferior pole of left kidney measures 5 mm and is too small to characterize. Urinary  bladder appears within normal limits. Stomach/Bowel: Stomach is normal. The small bowel loops have a normal caliber. Postoperative changes from right hemicolectomy identified. Enterocolonic anastomosis. No specific findings at the anastomosis to suggest local tumor recurrence or complication. No pathologic dilatation of the remaining colon. Distal colonic diverticulosis without acute inflammation. Vascular/Lymphatic: Normal appearance of the abdominal aorta. No enlarged retroperitoneal or mesenteric adenopathy. No enlarged pelvic or inguinal lymph nodes. Reproductive: Prostate is unremarkable. Other: No abdominal wall hernia or abnormality. No abdominopelvic ascites. Musculoskeletal: No acute or significant osseous findings. IMPRESSION: 1. No significant interval change compared with 05/24/2017. No findings highly concerning for recurrent or metastatic disease status post right hemicolectomy. 2. Previously noted calcified and noncalcified nodules are stable when compared with the previous exam. A new tiny nodule within the right upper lobe measures 2-3 mm, nonspecific. This warrants attention on follow-up  imaging. 3. Calcifications within the pancreas which may reflect chronic pancreatitis. 4. Multinodular thyroid gland with dominant nodule in the right lobe. Consider further evaluation with thyroid ultrasound. If patient is clinically hyperthyroid, consider nuclear medicine thyroid uptake and scan. Electronically Signed   By: Kerby Moors M.D.   On: 05/25/2018 16:48    Assessment & Plan:    Walker Kehr, MD

## 2020-03-31 NOTE — Assessment & Plan Note (Signed)
Labs

## 2020-06-19 ENCOUNTER — Other Ambulatory Visit: Payer: Self-pay | Admitting: *Deleted

## 2020-06-19 MED ORDER — TADALAFIL 20 MG PO TABS
10.0000 mg | ORAL_TABLET | Freq: Every day | ORAL | 2 refills | Status: DC | PRN
Start: 2020-06-19 — End: 2020-06-30

## 2020-06-26 ENCOUNTER — Other Ambulatory Visit: Payer: Self-pay | Admitting: Internal Medicine

## 2020-06-26 NOTE — Telephone Encounter (Signed)
Check [place] last filled 05/29/2020.Marland Kitchen/LMB

## 2020-06-29 ENCOUNTER — Other Ambulatory Visit: Payer: Self-pay | Admitting: Internal Medicine

## 2020-06-29 MED ORDER — METHADONE HCL 10 MG PO TABS: 20.0000 mg | ORAL_TABLET | Freq: Two times a day (BID) | ORAL | 0 refills | Status: DC | PRN

## 2020-06-30 ENCOUNTER — Encounter: Payer: Self-pay | Admitting: Internal Medicine

## 2020-06-30 ENCOUNTER — Ambulatory Visit: Payer: Medicare PPO | Admitting: Internal Medicine

## 2020-06-30 ENCOUNTER — Other Ambulatory Visit: Payer: Self-pay

## 2020-06-30 VITALS — BP 168/80 | HR 91 | Temp 98.3°F | Wt 170.4 lb

## 2020-06-30 DIAGNOSIS — N320 Bladder-neck obstruction: Secondary | ICD-10-CM | POA: Diagnosis not present

## 2020-06-30 DIAGNOSIS — C182 Malignant neoplasm of ascending colon: Secondary | ICD-10-CM | POA: Diagnosis not present

## 2020-06-30 DIAGNOSIS — G8929 Other chronic pain: Secondary | ICD-10-CM | POA: Diagnosis not present

## 2020-06-30 DIAGNOSIS — E1142 Type 2 diabetes mellitus with diabetic polyneuropathy: Secondary | ICD-10-CM

## 2020-06-30 DIAGNOSIS — M5440 Lumbago with sciatica, unspecified side: Secondary | ICD-10-CM | POA: Diagnosis not present

## 2020-06-30 DIAGNOSIS — K760 Fatty (change of) liver, not elsewhere classified: Secondary | ICD-10-CM

## 2020-06-30 DIAGNOSIS — N529 Male erectile dysfunction, unspecified: Secondary | ICD-10-CM

## 2020-06-30 LAB — COMPREHENSIVE METABOLIC PANEL
ALT: 23 U/L (ref 0–53)
AST: 22 U/L (ref 0–37)
Albumin: 4.5 g/dL (ref 3.5–5.2)
Alkaline Phosphatase: 77 U/L (ref 39–117)
BUN: 9 mg/dL (ref 6–23)
CO2: 32 mEq/L (ref 19–32)
Calcium: 9.8 mg/dL (ref 8.4–10.5)
Chloride: 100 mEq/L (ref 96–112)
Creatinine, Ser: 0.77 mg/dL (ref 0.40–1.50)
GFR: 86.54 mL/min (ref >60.00)
Glucose, Bld: 126 mg/dL — ABNORMAL HIGH (ref 70–99)
Potassium: 3.9 mEq/L (ref 3.5–5.1)
Sodium: 138 mEq/L (ref 135–145)
Total Bilirubin: 0.6 mg/dL (ref 0.2–1.2)
Total Protein: 7.8 g/dL (ref 6.0–8.3)

## 2020-06-30 LAB — CBC WITH DIFFERENTIAL/PLATELET
Basophils Absolute: 0 10*3/uL (ref 0.0–0.1)
Basophils Relative: 0.7 % (ref 0.0–3.0)
Eosinophils Absolute: 0 10*3/uL (ref 0.0–0.7)
Eosinophils Relative: 0.7 % (ref 0.0–5.0)
HCT: 44.5 % (ref 39.0–52.0)
Hemoglobin: 15.3 g/dL (ref 13.0–17.0)
Lymphocytes Relative: 28.1 % (ref 12.0–46.0)
Lymphs Abs: 1.5 10*3/uL (ref 0.7–4.0)
MCHC: 34.3 g/dL (ref 30.0–36.0)
MCV: 96.2 fl (ref 78.0–100.0)
Monocytes Absolute: 0.7 10*3/uL (ref 0.1–1.0)
Monocytes Relative: 12.2 % — ABNORMAL HIGH (ref 3.0–12.0)
Neutro Abs: 3.1 10*3/uL (ref 1.4–7.7)
Neutrophils Relative %: 58.3 % (ref 43.0–77.0)
Platelets: 234 10*3/uL (ref 150.0–400.0)
RBC: 4.63 Mil/uL (ref 4.22–5.81)
RDW: 12.3 % (ref 11.5–15.5)
WBC: 5.4 10*3/uL (ref 4.0–10.5)

## 2020-06-30 LAB — PSA: PSA: 1.65 ng/mL (ref 0.10–4.00)

## 2020-06-30 LAB — HEMOGLOBIN A1C: Hgb A1c MFr Bld: 7.2 % — ABNORMAL HIGH (ref 4.6–6.5)

## 2020-06-30 MED ORDER — TADALAFIL 20 MG PO TABS
10.0000 mg | ORAL_TABLET | Freq: Every day | ORAL | 3 refills | Status: AC | PRN
Start: 2020-06-30 — End: ?

## 2020-06-30 MED ORDER — METHADONE HCL 10 MG PO TABS: 20.0000 mg | ORAL_TABLET | Freq: Two times a day (BID) | ORAL | 0 refills | Status: DC | PRN

## 2020-06-30 MED ORDER — DIAZEPAM 5 MG PO TABS
ORAL_TABLET | ORAL | 2 refills | Status: DC
Start: 2020-06-30 — End: 2021-01-05

## 2020-06-30 MED ORDER — TRUEPLUS LANCETS 30G MISC
5 refills | Status: DC
Start: 2020-06-30 — End: 2021-07-07

## 2020-06-30 MED ORDER — TRUE METRIX METER W/DEVICE KIT
PACK | 0 refills | Status: DC
Start: 2020-06-30 — End: 2023-10-02

## 2020-06-30 MED ORDER — TRUE METRIX LEVEL 2 NORMAL VI SOLN
3 refills | Status: DC
Start: 2020-06-30 — End: 2021-07-07

## 2020-06-30 NOTE — Assessment & Plan Note (Signed)
Stable Metahdone Rx

## 2020-06-30 NOTE — Progress Notes (Signed)
Subjective:  Patient ID: Gabriel Erickson, male    DOB: 1941/01/31  Age: 79 y.o. MRN: [id number]  CC: Follow-up (3 month f/u)   HPI Gabriel Erickson presents for chronic pain, DM, GERD, ED f/u Cialis is too $$$  Outpatient Medications Prior to Visit  Medication Sig Dispense Refill  . Alcohol Swabs PADS Use to clean finger before checking blood sugar level BID 200 each 3  . b complex vitamins tablet Take 1 tablet by mouth daily.    . Blood Glucose Calibration (TRUE METRIX LEVEL 2) Normal SOLN Use to calibrate meter 1 each 0  . Blood Glucose Monitoring Suppl (TRUE METRIX METER) w/Device KIT Use to check blood sugar BID 1 kit 0  . Cholecalciferol 1000 UNITS tablet Take 1,000 Units by mouth daily.    . diazepam (VALIUM) 5 MG tablet TAKE ONE TABLET TWICE DAILY AS NEEDED FOR muscle spasms, cramps, or insomnia 60 tablet 2  . famotidine (PEPCID) 40 MG tablet Take 1 tablet (40 mg total) by mouth daily. 90 tablet 3  . fluticasone (FLONASE) 50 MCG/ACT nasal spray Place 2 sprays into the nose daily. 16 g 11  . glimepiride (AMARYL) 4 MG tablet TAKE 1 TABLET BY MOUTH TWICE DAILY 180 tablet 3  . glucose blood test strip Use to check blood sugar BID 200 each 3  . metFORMIN (GLUCOPHAGE) 500 MG tablet Take 1 tablet (500 mg total) by mouth daily with breakfast. 90 tablet 3  . methadone (DOLOPHINE) 10 MG tablet Take 2 tablets (20 mg total) by mouth 2 (two) times daily as needed for severe pain. 120 tablet 0  . tadalafil (CIALIS) 20 MG tablet Take 0.5-1 tablets (10-20 mg total) by mouth daily as needed for erectile dysfunction. 2 tablet 2  . TRUEplus Lancets 30G MISC Use to check blood sugar BID 200 each 3  . methadone (DOLOPHINE) 10 MG tablet Take 20 mg every 8 hours as needed pain 180 tablet 0  . methadone (DOLOPHINE) 10 MG tablet Take 2 tablets (20 mg total) by mouth 2 (two) times daily as needed for severe pain. For pain M54.5 120 tablet 0  . nystatin cream (MYCOSTATIN) Apply 1 application topically 2 (two) times  daily. (Patient not taking: Reported on 06/30/2020) 30 g 1   No facility-administered medications prior to visit.    ROS: Review of Systems  Constitutional: Negative for appetite change, fatigue and unexpected weight change.  HENT: Negative for congestion, nosebleeds, sneezing, sore throat and trouble swallowing.   Eyes: Negative for itching and visual disturbance.  Respiratory: Negative for cough.   Cardiovascular: Negative for chest pain, palpitations and leg swelling.  Gastrointestinal: Negative for abdominal distention, blood in stool, diarrhea and nausea.  Genitourinary: Negative for frequency and hematuria.  Musculoskeletal: Positive for arthralgias, back pain and gait problem. Negative for joint swelling and neck pain.  Skin: Negative for rash.  Neurological: Positive for weakness. Negative for dizziness, tremors and speech difficulty.  Psychiatric/Behavioral: Negative for agitation, dysphoric mood, sleep disturbance and suicidal ideas. The patient is not nervous/anxious.     Objective:  BP (!) 168/80 (BP Location: Left Arm)   Pulse 91   Temp 98.3 F (36.8 C) (Oral)   Wt 170 lb 6.4 oz (77.3 kg)   SpO2 98%   BMI 23.11 kg/m   BP Readings from Last 3 Encounters:  06/30/20 (!) 168/80  03/31/20 (!) 180/78  12/30/19 (!) 172/90    Wt Readings from Last 3 Encounters:  06/30/20 170 lb 6.4  oz (77.3 kg)  03/31/20 170 lb (77.1 kg)  12/30/19 168 lb (76.2 kg)    Physical Exam Constitutional:      General: He is not in acute distress.    Appearance: He is well-developed.     Comments: NAD  HENT:     Mouth/Throat:     Mouth: Oropharynx is clear and moist.  Eyes:     Conjunctiva/sclera: Conjunctivae normal.     Pupils: Pupils are equal, round, and reactive to light.  Neck:     Thyroid: No thyromegaly.     Vascular: No JVD.  Cardiovascular:     Rate and Rhythm: Normal rate and regular rhythm.     Pulses: Intact distal pulses.     Heart sounds: Normal heart sounds. No  murmur heard. No friction rub. No gallop.   Pulmonary:     Effort: Pulmonary effort is normal. No respiratory distress.     Breath sounds: Normal breath sounds. No wheezing or rales.  Chest:     Chest wall: No tenderness.  Abdominal:     General: Bowel sounds are normal. There is no distension.     Palpations: Abdomen is soft. There is no mass.     Tenderness: There is no abdominal tenderness. There is no guarding or rebound.  Musculoskeletal:        General: Tenderness present. No edema. Normal range of motion.     Cervical back: Normal range of motion.  Lymphadenopathy:     Cervical: No cervical adenopathy.  Skin:    General: Skin is warm and dry.     Findings: No rash.  Neurological:     Mental Status: He is alert and oriented to person, place, and time.     Cranial Nerves: No cranial nerve deficit.     Motor: Weakness present. No abnormal muscle tone.     Coordination: He displays a negative Romberg sign. Coordination normal.     Gait: Gait abnormal.     Deep Tendon Reflexes: Reflexes are normal and symmetric.  Psychiatric:        Mood and Affect: Mood and affect normal.        Behavior: Behavior normal.        Thought Content: Thought content normal.        Judgment: Judgment normal.   LS spine w/pain LEs - weak    A total time of >45 minutes was spent preparing to see the patient, reviewing tests, x-rays, outside records.  Also, obtaining history and performing comprehensive physical exam.  Additionally, counseling the patient regarding the above listed issues.   Finally, documenting clinical information in the health records, coordination of care, educating the patient re colon cancer  And hemochromatosis f/u. Narcane Rx is considered - he is doing well.  It is a complex case.   Lab Results  Component Value Date   WBC 5.5 12/30/2019   HGB 15.2 12/30/2019   HCT 44.6 12/30/2019   PLT 218.0 12/30/2019   GLUCOSE 134 (H) 12/30/2019   CHOL 220 (H) 07/01/2019   TRIG  188.0 (H) 07/01/2019   HDL 37.60 (L) 07/01/2019   LDLDIRECT 142.3 04/12/2010   LDLCALC 145 (H) 07/01/2019   ALT 22 12/30/2019   AST 21 12/30/2019   NA 138 12/30/2019   K 4.2 12/30/2019   CL 102 12/30/2019   CREATININE 0.75 12/30/2019   BUN 10 12/30/2019   CO2 31 12/30/2019   TSH 0.51 07/01/2019   PSA 1.86 07/01/2019   HGBA1C  7.1 (H) 12/30/2019   MICROALBUR <0.7 06/19/2017    CT Chest W Contrast  Result Date: 05/25/2018 CLINICAL DATA:  Follow-up colon cancer. EXAM: CT CHEST, ABDOMEN, AND PELVIS WITH CONTRAST TECHNIQUE: Multidetector CT imaging of the chest, abdomen and pelvis was performed following the standard protocol during bolus administration of intravenous contrast. CONTRAST:  167m OMNIPAQUE IOHEXOL 300 MG/ML  SOLN COMPARISON:  05/24/2017 FINDINGS: CT CHEST FINDINGS Cardiovascular: Normal heart size.  No pericardial effusion. Mediastinum/Nodes: Multinodular thyroid gland is again noted. Dominant nodule in the right lobe measures 2.2 cm, image 9/2. The trachea appears patent and is midline. Normal appearance of the esophagus. No supraclavicular or axillary adenopathy. No mediastinal or hilar adenopathy. Lungs/Pleura: No pleural effusion. Stable calcified nodule in the right upper lobe measuring 6 mm, image 49/4. Unchanged. Small anterior right upper lobe lung nodule measuring 5 mm is stable, image 66/4. New right upper lobe lung nodule measures 2 mm, image 64/4. Right upper lobe perifissural nodule is stable measuring 5 mm, image 76/4. No additional pulmonary nodules identified. Musculoskeletal: No aggressive lytic or sclerotic bone lesions identified. CT ABDOMEN PELVIS FINDINGS Hepatobiliary: There are no suspicious liver lesions. The gallbladder appears normal. No biliary ductal dilatation. Pancreas: Scattered pancreatic calcifications are noted which may be the sequelae of chronic pancreatitis. No pancreatic inflammation, main duct dilatation or mass noted. Spleen: Spleen appears normal.  Adrenals/Urinary Tract: The adrenal glands are unremarkable. Right kidney is normal. Small low-density structure within the inferior pole of left kidney measures 5 mm and is too small to characterize. Urinary bladder appears within normal limits. Stomach/Bowel: Stomach is normal. The small bowel loops have a normal caliber. Postoperative changes from right hemicolectomy identified. Enterocolonic anastomosis. No specific findings at the anastomosis to suggest local tumor recurrence or complication. No pathologic dilatation of the remaining colon. Distal colonic diverticulosis without acute inflammation. Vascular/Lymphatic: Normal appearance of the abdominal aorta. No enlarged retroperitoneal or mesenteric adenopathy. No enlarged pelvic or inguinal lymph nodes. Reproductive: Prostate is unremarkable. Other: No abdominal wall hernia or abnormality. No abdominopelvic ascites. Musculoskeletal: No acute or significant osseous findings. IMPRESSION: 1. No significant interval change compared with 05/24/2017. No findings highly concerning for recurrent or metastatic disease status post right hemicolectomy. 2. Previously noted calcified and noncalcified nodules are stable when compared with the previous exam. A new tiny nodule within the right upper lobe measures 2-3 mm, nonspecific. This warrants attention on follow-up imaging. 3. Calcifications within the pancreas which may reflect chronic pancreatitis. 4. Multinodular thyroid gland with dominant nodule in the right lobe. Consider further evaluation with thyroid ultrasound. If patient is clinically hyperthyroid, consider nuclear medicine thyroid uptake and scan. Electronically Signed   By: TKerby MoorsM.D.   On: 05/25/2018 16:48   CT Abdomen Pelvis W Contrast  Result Date: 05/25/2018 CLINICAL DATA:  Follow-up colon cancer. EXAM: CT CHEST, ABDOMEN, AND PELVIS WITH CONTRAST TECHNIQUE: Multidetector CT imaging of the chest, abdomen and pelvis was performed following the  standard protocol during bolus administration of intravenous contrast. CONTRAST:  1021mOMNIPAQUE IOHEXOL 300 MG/ML  SOLN COMPARISON:  05/24/2017 FINDINGS: CT CHEST FINDINGS Cardiovascular: Normal heart size.  No pericardial effusion. Mediastinum/Nodes: Multinodular thyroid gland is again noted. Dominant nodule in the right lobe measures 2.2 cm, image 9/2. The trachea appears patent and is midline. Normal appearance of the esophagus. No supraclavicular or axillary adenopathy. No mediastinal or hilar adenopathy. Lungs/Pleura: No pleural effusion. Stable calcified nodule in the right upper lobe measuring 6 mm, image 49/4. Unchanged. Small anterior right  upper lobe lung nodule measuring 5 mm is stable, image 66/4. New right upper lobe lung nodule measures 2 mm, image 64/4. Right upper lobe perifissural nodule is stable measuring 5 mm, image 76/4. No additional pulmonary nodules identified. Musculoskeletal: No aggressive lytic or sclerotic bone lesions identified. CT ABDOMEN PELVIS FINDINGS Hepatobiliary: There are no suspicious liver lesions. The gallbladder appears normal. No biliary ductal dilatation. Pancreas: Scattered pancreatic calcifications are noted which may be the sequelae of chronic pancreatitis. No pancreatic inflammation, main duct dilatation or mass noted. Spleen: Spleen appears normal. Adrenals/Urinary Tract: The adrenal glands are unremarkable. Right kidney is normal. Small low-density structure within the inferior pole of left kidney measures 5 mm and is too small to characterize. Urinary bladder appears within normal limits. Stomach/Bowel: Stomach is normal. The small bowel loops have a normal caliber. Postoperative changes from right hemicolectomy identified. Enterocolonic anastomosis. No specific findings at the anastomosis to suggest local tumor recurrence or complication. No pathologic dilatation of the remaining colon. Distal colonic diverticulosis without acute inflammation. Vascular/Lymphatic:  Normal appearance of the abdominal aorta. No enlarged retroperitoneal or mesenteric adenopathy. No enlarged pelvic or inguinal lymph nodes. Reproductive: Prostate is unremarkable. Other: No abdominal wall hernia or abnormality. No abdominopelvic ascites. Musculoskeletal: No acute or significant osseous findings. IMPRESSION: 1. No significant interval change compared with 05/24/2017. No findings highly concerning for recurrent or metastatic disease status post right hemicolectomy. 2. Previously noted calcified and noncalcified nodules are stable when compared with the previous exam. A new tiny nodule within the right upper lobe measures 2-3 mm, nonspecific. This warrants attention on follow-up imaging. 3. Calcifications within the pancreas which may reflect chronic pancreatitis. 4. Multinodular thyroid gland with dominant nodule in the right lobe. Consider further evaluation with thyroid ultrasound. If patient is clinically hyperthyroid, consider nuclear medicine thyroid uptake and scan. Electronically Signed   By: Kerby Moors M.D.   On: 05/25/2018 16:48    Assessment & Plan:    Walker Kehr, MD

## 2020-06-30 NOTE — Assessment & Plan Note (Signed)
AFP 

## 2020-06-30 NOTE — Assessment & Plan Note (Signed)
CEA, CBC

## 2020-06-30 NOTE — Assessment & Plan Note (Signed)
Cialis

## 2020-06-30 NOTE — Assessment & Plan Note (Signed)
Cont w/Glimepiride, Metformin

## 2020-06-30 NOTE — Assessment & Plan Note (Addendum)
AFP CBC iron

## 2020-07-01 LAB — AFP TUMOR MARKER: AFP-Tumor Marker: 2.6 ng/mL (ref <6.1)

## 2020-07-01 LAB — IRON,TIBC AND FERRITIN PANEL
%SAT: 62 % (calc) — ABNORMAL HIGH (ref 20–48)
Ferritin: 371 ng/mL (ref 24–380)
Iron: 189 ug/dL — ABNORMAL HIGH (ref 50–180)
TIBC: 303 mcg/dL (calc) (ref 250–425)

## 2020-07-01 LAB — CEA: CEA: 2.2 ng/mL

## 2020-07-07 ENCOUNTER — Other Ambulatory Visit: Payer: Self-pay | Admitting: *Deleted

## 2020-07-07 MED ORDER — ALCOHOL SWABS PADS: MEDICATED_PAD | 3 refills | Status: DC

## 2020-07-07 MED ORDER — GLUCOSE BLOOD VI STRP: ORAL_STRIP | 3 refills | Status: DC

## 2020-07-14 ENCOUNTER — Other Ambulatory Visit: Payer: Self-pay | Admitting: Internal Medicine

## 2020-09-07 NOTE — Telephone Encounter (Signed)
Encounter opened in error

## 2020-09-25 ENCOUNTER — Other Ambulatory Visit: Payer: Self-pay | Admitting: Internal Medicine

## 2020-09-25 NOTE — Telephone Encounter (Signed)
Check [facility] registry last filled 08/28/2020. MD is out of the office until 10/05/20.. pls advise

## 2020-10-06 ENCOUNTER — Other Ambulatory Visit: Payer: Self-pay

## 2020-10-06 ENCOUNTER — Encounter: Payer: Self-pay | Admitting: Internal Medicine

## 2020-10-06 ENCOUNTER — Ambulatory Visit: Payer: Medicare PPO | Admitting: Internal Medicine

## 2020-10-06 DIAGNOSIS — G8929 Other chronic pain: Secondary | ICD-10-CM | POA: Diagnosis not present

## 2020-10-06 DIAGNOSIS — E1142 Type 2 diabetes mellitus with diabetic polyneuropathy: Secondary | ICD-10-CM | POA: Diagnosis not present

## 2020-10-06 DIAGNOSIS — M5440 Lumbago with sciatica, unspecified side: Secondary | ICD-10-CM

## 2020-10-06 LAB — COMPREHENSIVE METABOLIC PANEL
ALT: 22 U/L (ref 0–53)
AST: 23 U/L (ref 0–37)
Albumin: 4.5 g/dL (ref 3.5–5.2)
Alkaline Phosphatase: 68 U/L (ref 39–117)
BUN: 13 mg/dL (ref 6–23)
CO2: 32 mEq/L (ref 19–32)
Calcium: 10 mg/dL (ref 8.4–10.5)
Chloride: 101 mEq/L (ref 96–112)
Creatinine, Ser: 0.75 mg/dL (ref 0.40–1.50)
GFR: 87.07 mL/min (ref >60.00)
Glucose, Bld: 126 mg/dL — ABNORMAL HIGH (ref 70–99)
Potassium: 4.5 mEq/L (ref 3.5–5.1)
Sodium: 139 mEq/L (ref 135–145)
Total Bilirubin: 0.7 mg/dL (ref 0.2–1.2)
Total Protein: 7.7 g/dL (ref 6.0–8.3)

## 2020-10-06 LAB — HEMOGLOBIN A1C: Hgb A1c MFr Bld: 7.7 % — ABNORMAL HIGH (ref 4.6–6.5)

## 2020-10-06 MED ORDER — METHADONE HCL 10 MG PO TABS: 20.0000 mg | ORAL_TABLET | Freq: Two times a day (BID) | ORAL | 0 refills | Status: DC | PRN

## 2020-10-06 NOTE — Progress Notes (Signed)
Subjective:  Patient ID: Gabriel Erickson, male    DOB: 1940-10-27  Age: 80 y.o. MRN: [id number]  CC: Follow-up (3 month f/u)   HPI Gabriel Erickson presents for DM, HTN, low back pain  Outpatient Medications Prior to Visit  Medication Sig Dispense Refill  . Alcohol Swabs PADS Use to clean finger before checking blood sugar level BID 200 each 3  . b complex vitamins tablet Take 1 tablet by mouth daily.    . Blood Glucose Calibration (TRUE METRIX LEVEL 2) Normal SOLN Use to calibrate meter 1 each 3  . Blood Glucose Monitoring Suppl (TRUE METRIX METER) w/Device KIT Use to check blood sugar BID 1 kit 0  . Cholecalciferol 1000 UNITS tablet Take 1,000 Units by mouth daily.    . diazepam (VALIUM) 5 MG tablet TAKE ONE TABLET TWICE DAILY AS NEEDED FOR muscle spasms, cramps, or insomnia 60 tablet 2  . famotidine (PEPCID) 40 MG tablet Take 1 tablet (40 mg total) by mouth daily. 90 tablet 3  . fluticasone (FLONASE) 50 MCG/ACT nasal spray Place 2 sprays into the nose daily. 16 g 11  . glimepiride (AMARYL) 4 MG tablet TAKE 1 TABLET BY MOUTH TWICE DAILY 180 tablet 3  . metFORMIN (GLUCOPHAGE) 500 MG tablet Take 1 tablet (500 mg total) by mouth daily with breakfast. 90 tablet 3  . methadone (DOLOPHINE) 10 MG tablet Take 2 tablets (20 mg total) by mouth 2 (two) times daily as needed for severe pain. For pain M54.5 120 tablet 0  . tadalafil (CIALIS) 20 MG tablet Take 0.5-1 tablets (10-20 mg total) by mouth daily as needed for erectile dysfunction. 30 tablet 3  . TRUE METRIX BLOOD GLUCOSE TEST test strip TEST BLOOD SUGAR TWICE DAILY 200 strip 3  . TRUEplus Lancets 30G MISC Use to check blood sugar BID 200 each 5  . methadone (DOLOPHINE) 10 MG tablet Take 2 tablets (20 mg total) by mouth 2 (two) times daily as needed for severe pain. *0211* 32 tablet 0   No facility-administered medications prior to visit.    ROS: Review of Systems  Constitutional: Negative for appetite change, fatigue and unexpected weight  change.  HENT: Negative for congestion, nosebleeds, sneezing, sore throat and trouble swallowing.   Eyes: Negative for itching and visual disturbance.  Respiratory: Negative for cough.   Cardiovascular: Negative for chest pain, palpitations and leg swelling.  Gastrointestinal: Negative for abdominal distention, blood in stool, diarrhea and nausea.  Genitourinary: Negative for frequency and hematuria.  Musculoskeletal: Positive for back pain and gait problem. Negative for joint swelling and neck pain.  Skin: Negative for rash.  Neurological: Negative for dizziness, tremors, speech difficulty and weakness.  Psychiatric/Behavioral: Negative for agitation, dysphoric mood and sleep disturbance. The patient is not nervous/anxious.     Objective:  BP (!) 162/84 (BP Location: Left Arm)   Pulse 85   Temp 97.9 F (36.6 C) (Oral)   Ht 6' (1.829 m)   Wt 167 lb 6.4 oz (75.9 kg)   SpO2 97%   BMI 22.70 kg/m   BP Readings from Last 3 Encounters:  10/06/20 (!) 162/84  06/30/20 (!) 168/80  03/31/20 (!) 180/78    Wt Readings from Last 3 Encounters:  10/06/20 167 lb 6.4 oz (75.9 kg)  06/30/20 170 lb 6.4 oz (77.3 kg)  03/31/20 170 lb (77.1 kg)    Physical Exam Constitutional:      General: He is not in acute distress.    Appearance: He is well-developed.  Comments: NAD  Eyes:     Conjunctiva/sclera: Conjunctivae normal.     Pupils: Pupils are equal, round, and reactive to light.  Neck:     Thyroid: No thyromegaly.     Vascular: No JVD.  Cardiovascular:     Rate and Rhythm: Normal rate and regular rhythm.     Heart sounds: Normal heart sounds. No murmur heard. No friction rub. No gallop.   Pulmonary:     Effort: Pulmonary effort is normal. No respiratory distress.     Breath sounds: Normal breath sounds. No wheezing or rales.  Chest:     Chest wall: No tenderness.  Abdominal:     General: Bowel sounds are normal. There is no distension.     Palpations: Abdomen is soft. There is  no mass.     Tenderness: There is no abdominal tenderness. There is no guarding or rebound.  Musculoskeletal:        General: Tenderness present. Normal range of motion.     Cervical back: Normal range of motion.  Lymphadenopathy:     Cervical: No cervical adenopathy.  Skin:    General: Skin is warm and dry.     Findings: No rash.  Neurological:     Mental Status: He is alert and oriented to person, place, and time.     Cranial Nerves: No cranial nerve deficit.     Motor: Weakness present. No abnormal muscle tone.     Coordination: Coordination abnormal.     Gait: Gait abnormal.     Deep Tendon Reflexes: Reflexes are normal and symmetric.  Psychiatric:        Behavior: Behavior normal.        Thought Content: Thought content normal.        Judgment: Judgment normal.   Low back pain full range of motion  Lab Results  Component Value Date   WBC 5.4 06/30/2020   HGB 15.3 06/30/2020   HCT 44.5 06/30/2020   PLT 234.0 06/30/2020   GLUCOSE 126 (H) 06/30/2020   CHOL 220 (H) 07/01/2019   TRIG 188.0 (H) 07/01/2019   HDL 37.60 (L) 07/01/2019   LDLDIRECT 142.3 04/12/2010   LDLCALC 145 (H) 07/01/2019   ALT 23 06/30/2020   AST 22 06/30/2020   NA 138 06/30/2020   K 3.9 06/30/2020   CL 100 06/30/2020   CREATININE 0.77 06/30/2020   BUN 9 06/30/2020   CO2 32 06/30/2020   TSH 0.51 07/01/2019   PSA 1.65 06/30/2020   HGBA1C 7.2 (H) 06/30/2020   MICROALBUR <0.7 06/19/2017    CT Chest W Contrast  Result Date: 05/25/2018 CLINICAL DATA:  Follow-up colon cancer. EXAM: CT CHEST, ABDOMEN, AND PELVIS WITH CONTRAST TECHNIQUE: Multidetector CT imaging of the chest, abdomen and pelvis was performed following the standard protocol during bolus administration of intravenous contrast. CONTRAST:  167m OMNIPAQUE IOHEXOL 300 MG/ML  SOLN COMPARISON:  05/24/2017 FINDINGS: CT CHEST FINDINGS Cardiovascular: Normal heart size.  No pericardial effusion. Mediastinum/Nodes: Multinodular thyroid gland is again  noted. Dominant nodule in the right lobe measures 2.2 cm, image 9/2. The trachea appears patent and is midline. Normal appearance of the esophagus. No supraclavicular or axillary adenopathy. No mediastinal or hilar adenopathy. Lungs/Pleura: No pleural effusion. Stable calcified nodule in the right upper lobe measuring 6 mm, image 49/4. Unchanged. Small anterior right upper lobe lung nodule measuring 5 mm is stable, image 66/4. New right upper lobe lung nodule measures 2 mm, image 64/4. Right upper lobe perifissural nodule is stable  measuring 5 mm, image 76/4. No additional pulmonary nodules identified. Musculoskeletal: No aggressive lytic or sclerotic bone lesions identified. CT ABDOMEN PELVIS FINDINGS Hepatobiliary: There are no suspicious liver lesions. The gallbladder appears normal. No biliary ductal dilatation. Pancreas: Scattered pancreatic calcifications are noted which may be the sequelae of chronic pancreatitis. No pancreatic inflammation, main duct dilatation or mass noted. Spleen: Spleen appears normal. Adrenals/Urinary Tract: The adrenal glands are unremarkable. Right kidney is normal. Small low-density structure within the inferior pole of left kidney measures 5 mm and is too small to characterize. Urinary bladder appears within normal limits. Stomach/Bowel: Stomach is normal. The small bowel loops have a normal caliber. Postoperative changes from right hemicolectomy identified. Enterocolonic anastomosis. No specific findings at the anastomosis to suggest local tumor recurrence or complication. No pathologic dilatation of the remaining colon. Distal colonic diverticulosis without acute inflammation. Vascular/Lymphatic: Normal appearance of the abdominal aorta. No enlarged retroperitoneal or mesenteric adenopathy. No enlarged pelvic or inguinal lymph nodes. Reproductive: Prostate is unremarkable. Other: No abdominal wall hernia or abnormality. No abdominopelvic ascites. Musculoskeletal: No acute or  significant osseous findings. IMPRESSION: 1. No significant interval change compared with 05/24/2017. No findings highly concerning for recurrent or metastatic disease status post right hemicolectomy. 2. Previously noted calcified and noncalcified nodules are stable when compared with the previous exam. A new tiny nodule within the right upper lobe measures 2-3 mm, nonspecific. This warrants attention on follow-up imaging. 3. Calcifications within the pancreas which may reflect chronic pancreatitis. 4. Multinodular thyroid gland with dominant nodule in the right lobe. Consider further evaluation with thyroid ultrasound. If patient is clinically hyperthyroid, consider nuclear medicine thyroid uptake and scan. Electronically Signed   By: Kerby Moors M.D.   On: 05/25/2018 16:48   CT Abdomen Pelvis W Contrast  Result Date: 05/25/2018 CLINICAL DATA:  Follow-up colon cancer. EXAM: CT CHEST, ABDOMEN, AND PELVIS WITH CONTRAST TECHNIQUE: Multidetector CT imaging of the chest, abdomen and pelvis was performed following the standard protocol during bolus administration of intravenous contrast. CONTRAST:  130m OMNIPAQUE IOHEXOL 300 MG/ML  SOLN COMPARISON:  05/24/2017 FINDINGS: CT CHEST FINDINGS Cardiovascular: Normal heart size.  No pericardial effusion. Mediastinum/Nodes: Multinodular thyroid gland is again noted. Dominant nodule in the right lobe measures 2.2 cm, image 9/2. The trachea appears patent and is midline. Normal appearance of the esophagus. No supraclavicular or axillary adenopathy. No mediastinal or hilar adenopathy. Lungs/Pleura: No pleural effusion. Stable calcified nodule in the right upper lobe measuring 6 mm, image 49/4. Unchanged. Small anterior right upper lobe lung nodule measuring 5 mm is stable, image 66/4. New right upper lobe lung nodule measures 2 mm, image 64/4. Right upper lobe perifissural nodule is stable measuring 5 mm, image 76/4. No additional pulmonary nodules identified.  Musculoskeletal: No aggressive lytic or sclerotic bone lesions identified. CT ABDOMEN PELVIS FINDINGS Hepatobiliary: There are no suspicious liver lesions. The gallbladder appears normal. No biliary ductal dilatation. Pancreas: Scattered pancreatic calcifications are noted which may be the sequelae of chronic pancreatitis. No pancreatic inflammation, main duct dilatation or mass noted. Spleen: Spleen appears normal. Adrenals/Urinary Tract: The adrenal glands are unremarkable. Right kidney is normal. Small low-density structure within the inferior pole of left kidney measures 5 mm and is too small to characterize. Urinary bladder appears within normal limits. Stomach/Bowel: Stomach is normal. The small bowel loops have a normal caliber. Postoperative changes from right hemicolectomy identified. Enterocolonic anastomosis. No specific findings at the anastomosis to suggest local tumor recurrence or complication. No pathologic dilatation of the remaining  colon. Distal colonic diverticulosis without acute inflammation. Vascular/Lymphatic: Normal appearance of the abdominal aorta. No enlarged retroperitoneal or mesenteric adenopathy. No enlarged pelvic or inguinal lymph nodes. Reproductive: Prostate is unremarkable. Other: No abdominal wall hernia or abnormality. No abdominopelvic ascites. Musculoskeletal: No acute or significant osseous findings. IMPRESSION: 1. No significant interval change compared with 05/24/2017. No findings highly concerning for recurrent or metastatic disease status post right hemicolectomy. 2. Previously noted calcified and noncalcified nodules are stable when compared with the previous exam. A new tiny nodule within the right upper lobe measures 2-3 mm, nonspecific. This warrants attention on follow-up imaging. 3. Calcifications within the pancreas which may reflect chronic pancreatitis. 4. Multinodular thyroid gland with dominant nodule in the right lobe. Consider further evaluation with thyroid  ultrasound. If patient is clinically hyperthyroid, consider nuclear medicine thyroid uptake and scan. Electronically Signed   By: Kerby Moors M.D.   On: 05/25/2018 16:48    Assessment & Plan:    Walker Kehr, MD

## 2020-10-06 NOTE — Assessment & Plan Note (Signed)
Potential benefits of a long term Methadone use as well as potential risks (i.e. addiction risk, apnea etc) and complications (i.e. Somnolence, constipation and others) were explained to the patient and were aknowledged. 3/20 - reducing the dose per Corderius's request w/caution Diazepam Benzo use risk is discussed - taking very little

## 2020-10-06 NOTE — Assessment & Plan Note (Signed)
Check A1c. 

## 2020-11-09 ENCOUNTER — Other Ambulatory Visit: Payer: Self-pay | Admitting: *Deleted

## 2020-11-09 MED ORDER — GLIMEPIRIDE 4 MG PO TABS: 4.0000 mg | ORAL_TABLET | Freq: Two times a day (BID) | ORAL | 1 refills | Status: DC

## 2020-11-09 MED ORDER — METFORMIN HCL 500 MG PO TABS: 500.0000 mg | ORAL_TABLET | Freq: Every day | ORAL | 1 refills | Status: DC

## 2020-12-12 DIAGNOSIS — L03115 Cellulitis of right lower limb: Secondary | ICD-10-CM | POA: Diagnosis not present

## 2021-01-05 ENCOUNTER — Encounter: Payer: Self-pay | Admitting: Internal Medicine

## 2021-01-05 ENCOUNTER — Ambulatory Visit: Payer: Medicare PPO | Admitting: Internal Medicine

## 2021-01-05 ENCOUNTER — Other Ambulatory Visit: Payer: Self-pay

## 2021-01-05 VITALS — BP 158/78 | HR 84 | Temp 98.4°F | Ht 72.0 in | Wt 163.6 lb

## 2021-01-05 DIAGNOSIS — Z85038 Personal history of other malignant neoplasm of large intestine: Secondary | ICD-10-CM

## 2021-01-05 DIAGNOSIS — M5440 Lumbago with sciatica, unspecified side: Secondary | ICD-10-CM | POA: Diagnosis not present

## 2021-01-05 DIAGNOSIS — Z23 Encounter for immunization: Secondary | ICD-10-CM | POA: Diagnosis not present

## 2021-01-05 DIAGNOSIS — E1142 Type 2 diabetes mellitus with diabetic polyneuropathy: Secondary | ICD-10-CM | POA: Diagnosis not present

## 2021-01-05 DIAGNOSIS — R030 Elevated blood-pressure reading, without diagnosis of hypertension: Secondary | ICD-10-CM | POA: Diagnosis not present

## 2021-01-05 DIAGNOSIS — G8929 Other chronic pain: Secondary | ICD-10-CM | POA: Diagnosis not present

## 2021-01-05 LAB — COMPREHENSIVE METABOLIC PANEL
ALT: 22 U/L (ref 0–53)
AST: 25 U/L (ref 0–37)
Albumin: 4.5 g/dL (ref 3.5–5.2)
Alkaline Phosphatase: 64 U/L (ref 39–117)
BUN: 12 mg/dL (ref 6–23)
CO2: 30 mEq/L (ref 19–32)
Calcium: 10 mg/dL (ref 8.4–10.5)
Chloride: 100 mEq/L (ref 96–112)
Creatinine, Ser: 0.83 mg/dL (ref 0.40–1.50)
GFR: 84.29 mL/min (ref >60.00)
Glucose, Bld: 77 mg/dL (ref 70–99)
Potassium: 3.8 mEq/L (ref 3.5–5.1)
Sodium: 139 mEq/L (ref 135–145)
Total Bilirubin: 0.6 mg/dL (ref 0.2–1.2)
Total Protein: 7.9 g/dL (ref 6.0–8.3)

## 2021-01-05 LAB — HEMOGLOBIN A1C: Hgb A1c MFr Bld: 7.5 % — ABNORMAL HIGH (ref 4.6–6.5)

## 2021-01-05 MED ORDER — METHADONE HCL 10 MG PO TABS: 20.0000 mg | ORAL_TABLET | Freq: Two times a day (BID) | ORAL | 0 refills | Status: DC | PRN

## 2021-01-05 MED ORDER — DIAZEPAM 5 MG PO TABS
ORAL_TABLET | ORAL | 2 refills | Status: DC
Start: 2021-01-05 — End: 2021-07-07

## 2021-01-05 NOTE — Assessment & Plan Note (Signed)
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 addiction risk, apnea etc) and complications (i.e. Somnolence, constipation and others) were explained to the patient and were aknowledged Diazepam Benzo use risk is discussed - taking very little Narcane option discussed

## 2021-01-05 NOTE — Addendum Note (Signed)
Addended by: Earnstine Regal on: 01/05/2021 01:53 PM   Modules accepted: Orders

## 2021-01-05 NOTE — Progress Notes (Signed)
 Subjective:  Patient ID: Gabriel Erickson, male    DOB: 11/08/1940  Age: 79 y.o. MRN: [id number]  CC: Follow-up (3 month f/u- Req refill on Methadone)   HPI Gabriel Erickson presents for LBP, spasms, DM, HTN  Outpatient Medications Prior to Visit  Medication Sig Dispense Refill   Alcohol Swabs PADS Use to clean finger before checking blood sugar level BID 200 each 3   b complex vitamins tablet Take 1 tablet by mouth daily.     Blood Glucose Calibration (TRUE METRIX LEVEL 2) Normal SOLN Use to calibrate meter 1 each 3   Blood Glucose Monitoring Suppl (TRUE METRIX METER) w/Device KIT Use to check blood sugar BID 1 kit 0   Cholecalciferol 1000 UNITS tablet Take 1,000 Units by mouth daily.     diazepam (VALIUM) 5 MG tablet TAKE ONE TABLET TWICE DAILY AS NEEDED FOR muscle spasms, cramps, or insomnia 60 tablet 2   famotidine (PEPCID) 40 MG tablet Take 1 tablet (40 mg total) by mouth daily. 90 tablet 3   fluticasone (FLONASE) 50 MCG/ACT nasal spray Place 2 sprays into the nose daily. 16 g 11   glimepiride (AMARYL) 4 MG tablet Take 1 tablet (4 mg total) by mouth 2 (two) times daily. 180 tablet 1   metFORMIN (GLUCOPHAGE) 500 MG tablet Take 1 tablet (500 mg total) by mouth daily with breakfast. 90 tablet 1   methadone (DOLOPHINE) 10 MG tablet Take 2 tablets (20 mg total) by mouth 2 (two) times daily as needed for severe pain. For pain M54.5 120 tablet 0   methadone (DOLOPHINE) 10 MG tablet Take 2 tablets (20 mg total) by mouth 2 (two) times daily as needed for severe pain. 120 tablet 0   methadone (DOLOPHINE) 10 MG tablet Take 2 tablets (20 mg total) by mouth 2 (two) times daily as needed for severe pain. 120 tablet 0   tadalafil (CIALIS) 20 MG tablet Take 0.5-1 tablets (10-20 mg total) by mouth daily as needed for erectile dysfunction. 30 tablet 3   TRUE METRIX BLOOD GLUCOSE TEST test strip TEST BLOOD SUGAR TWICE DAILY 200 strip 3   TRUEplus Lancets 30G MISC Use to check blood sugar BID 200 each 5   No  facility-administered medications prior to visit.    ROS: Review of Systems  Constitutional:  Positive for fatigue. Negative for appetite change, fever and unexpected weight change.  HENT:  Negative for congestion, nosebleeds, sneezing, sore throat and trouble swallowing.   Eyes:  Negative for itching and visual disturbance.  Respiratory:  Negative for cough.   Cardiovascular:  Negative for chest pain, palpitations and leg swelling.  Gastrointestinal:  Negative for abdominal distention, blood in stool, diarrhea and nausea.  Genitourinary:  Negative for frequency and hematuria.  Musculoskeletal:  Positive for back pain and gait problem. Negative for joint swelling and neck pain.  Skin:  Negative for rash.  Neurological:  Negative for dizziness, tremors, speech difficulty and weakness.  Psychiatric/Behavioral:  Negative for agitation, dysphoric mood and sleep disturbance. The patient is not nervous/anxious.    Objective:  BP (!) 158/78 (BP Location: Left Arm)   Pulse 84   Temp 98.4 F (36.9 C) (Oral)   Ht 6' (1.829 m)   Wt 163 lb 9.6 oz (74.2 kg)   SpO2 97%   BMI 22.19 kg/m   BP Readings from Last 3 Encounters:  01/05/21 (!) 158/78  10/06/20 (!) 162/84  06/30/20 (!) 168/80    Wt Readings from Last 3 Encounters:    01/05/21 163 lb 9.6 oz (74.2 kg)  10/06/20 167 lb 6.4 oz (75.9 kg)  06/30/20 170 lb 6.4 oz (77.3 kg)    Physical Exam  Lab Results  Component Value Date   WBC 5.4 06/30/2020   HGB 15.3 06/30/2020   HCT 44.5 06/30/2020   PLT 234.0 06/30/2020   GLUCOSE 126 (H) 10/06/2020   CHOL 220 (H) 07/01/2019   TRIG 188.0 (H) 07/01/2019   HDL 37.60 (L) 07/01/2019   LDLDIRECT 142.3 04/12/2010   LDLCALC 145 (H) 07/01/2019   ALT 22 10/06/2020   AST 23 10/06/2020   NA 139 10/06/2020   K 4.5 10/06/2020   CL 101 10/06/2020   CREATININE 0.75 10/06/2020   BUN 13 10/06/2020   CO2 32 10/06/2020   TSH 0.51 07/01/2019   PSA 1.65 06/30/2020   HGBA1C 7.7 (H) 10/06/2020    MICROALBUR <0.7 06/19/2017    CT Chest W Contrast  Result Date: 05/25/2018 CLINICAL DATA:  Follow-up colon cancer. EXAM: CT CHEST, ABDOMEN, AND PELVIS WITH CONTRAST TECHNIQUE: Multidetector CT imaging of the chest, abdomen and pelvis was performed following the standard protocol during bolus administration of intravenous contrast. CONTRAST:  100mL OMNIPAQUE IOHEXOL 300 MG/ML  SOLN COMPARISON:  05/24/2017 FINDINGS: CT CHEST FINDINGS Cardiovascular: Normal heart size.  No pericardial effusion. Mediastinum/Nodes: Multinodular thyroid gland is again noted. Dominant nodule in the right lobe measures 2.2 cm, image 9/2. The trachea appears patent and is midline. Normal appearance of the esophagus. No supraclavicular or axillary adenopathy. No mediastinal or hilar adenopathy. Lungs/Pleura: No pleural effusion. Stable calcified nodule in the right upper lobe measuring 6 mm, image 49/4. Unchanged. Small anterior right upper lobe lung nodule measuring 5 mm is stable, image 66/4. New right upper lobe lung nodule measures 2 mm, image 64/4. Right upper lobe perifissural nodule is stable measuring 5 mm, image 76/4. No additional pulmonary nodules identified. Musculoskeletal: No aggressive lytic or sclerotic bone lesions identified. CT ABDOMEN PELVIS FINDINGS Hepatobiliary: There are no suspicious liver lesions. The gallbladder appears normal. No biliary ductal dilatation. Pancreas: Scattered pancreatic calcifications are noted which may be the sequelae of chronic pancreatitis. No pancreatic inflammation, main duct dilatation or mass noted. Spleen: Spleen appears normal. Adrenals/Urinary Tract: The adrenal glands are unremarkable. Right kidney is normal. Small low-density structure within the inferior pole of left kidney measures 5 mm and is too small to characterize. Urinary bladder appears within normal limits. Stomach/Bowel: Stomach is normal. The small bowel loops have a normal caliber. Postoperative changes from right  hemicolectomy identified. Enterocolonic anastomosis. No specific findings at the anastomosis to suggest local tumor recurrence or complication. No pathologic dilatation of the remaining colon. Distal colonic diverticulosis without acute inflammation. Vascular/Lymphatic: Normal appearance of the abdominal aorta. No enlarged retroperitoneal or mesenteric adenopathy. No enlarged pelvic or inguinal lymph nodes. Reproductive: Prostate is unremarkable. Other: No abdominal wall hernia or abnormality. No abdominopelvic ascites. Musculoskeletal: No acute or significant osseous findings. IMPRESSION: 1. No significant interval change compared with 05/24/2017. No findings highly concerning for recurrent or metastatic disease status post right hemicolectomy. 2. Previously noted calcified and noncalcified nodules are stable when compared with the previous exam. A new tiny nodule within the right upper lobe measures 2-3 mm, nonspecific. This warrants attention on follow-up imaging. 3. Calcifications within the pancreas which may reflect chronic pancreatitis. 4. Multinodular thyroid gland with dominant nodule in the right lobe. Consider further evaluation with thyroid ultrasound. If patient is clinically hyperthyroid, consider nuclear medicine thyroid uptake and scan. Electronically Signed     By: Kerby Moors M.D.   On: 05/25/2018 16:48   CT Abdomen Pelvis W Contrast  Result Date: 05/25/2018 CLINICAL DATA:  Follow-up colon cancer. EXAM: CT CHEST, ABDOMEN, AND PELVIS WITH CONTRAST TECHNIQUE: Multidetector CT imaging of the chest, abdomen and pelvis was performed following the standard protocol during bolus administration of intravenous contrast. CONTRAST:  153m OMNIPAQUE IOHEXOL 300 MG/ML  SOLN COMPARISON:  05/24/2017 FINDINGS: CT CHEST FINDINGS Cardiovascular: Normal heart size.  No pericardial effusion. Mediastinum/Nodes: Multinodular thyroid gland is again noted. Dominant nodule in the right lobe measures 2.2 cm, image 9/2.  The trachea appears patent and is midline. Normal appearance of the esophagus. No supraclavicular or axillary adenopathy. No mediastinal or hilar adenopathy. Lungs/Pleura: No pleural effusion. Stable calcified nodule in the right upper lobe measuring 6 mm, image 49/4. Unchanged. Small anterior right upper lobe lung nodule measuring 5 mm is stable, image 66/4. New right upper lobe lung nodule measures 2 mm, image 64/4. Right upper lobe perifissural nodule is stable measuring 5 mm, image 76/4. No additional pulmonary nodules identified. Musculoskeletal: No aggressive lytic or sclerotic bone lesions identified. CT ABDOMEN PELVIS FINDINGS Hepatobiliary: There are no suspicious liver lesions. The gallbladder appears normal. No biliary ductal dilatation. Pancreas: Scattered pancreatic calcifications are noted which may be the sequelae of chronic pancreatitis. No pancreatic inflammation, main duct dilatation or mass noted. Spleen: Spleen appears normal. Adrenals/Urinary Tract: The adrenal glands are unremarkable. Right kidney is normal. Small low-density structure within the inferior pole of left kidney measures 5 mm and is too small to characterize. Urinary bladder appears within normal limits. Stomach/Bowel: Stomach is normal. The small bowel loops have a normal caliber. Postoperative changes from right hemicolectomy identified. Enterocolonic anastomosis. No specific findings at the anastomosis to suggest local tumor recurrence or complication. No pathologic dilatation of the remaining colon. Distal colonic diverticulosis without acute inflammation. Vascular/Lymphatic: Normal appearance of the abdominal aorta. No enlarged retroperitoneal or mesenteric adenopathy. No enlarged pelvic or inguinal lymph nodes. Reproductive: Prostate is unremarkable. Other: No abdominal wall hernia or abnormality. No abdominopelvic ascites. Musculoskeletal: No acute or significant osseous findings. IMPRESSION: 1. No significant interval  change compared with 05/24/2017. No findings highly concerning for recurrent or metastatic disease status post right hemicolectomy. 2. Previously noted calcified and noncalcified nodules are stable when compared with the previous exam. A new tiny nodule within the right upper lobe measures 2-3 mm, nonspecific. This warrants attention on follow-up imaging. 3. Calcifications within the pancreas which may reflect chronic pancreatitis. 4. Multinodular thyroid gland with dominant nodule in the right lobe. Consider further evaluation with thyroid ultrasound. If patient is clinically hyperthyroid, consider nuclear medicine thyroid uptake and scan. Electronically Signed   By: TKerby MoorsM.D.   On: 05/25/2018 16:48    Assessment & Plan:     AWalker Kehr MD

## 2021-01-05 NOTE — Addendum Note (Signed)
Addended by: Boris Lown B on: 01/05/2021 01:56 PM   Modules accepted: Orders

## 2021-01-05 NOTE — Assessment & Plan Note (Signed)
Colon due in 2022

## 2021-01-05 NOTE — Assessment & Plan Note (Signed)
Cont w/Glimepiride, Metformin Check A1c

## 2021-01-05 NOTE — Assessment & Plan Note (Signed)
Monitor BP 

## 2021-02-08 ENCOUNTER — Encounter: Payer: Self-pay | Admitting: Gastroenterology

## 2021-04-07 ENCOUNTER — Encounter: Payer: Self-pay | Admitting: Internal Medicine

## 2021-04-07 ENCOUNTER — Ambulatory Visit: Payer: Medicare PPO

## 2021-04-07 ENCOUNTER — Other Ambulatory Visit: Payer: Self-pay

## 2021-04-07 ENCOUNTER — Ambulatory Visit: Payer: Medicare PPO | Admitting: Internal Medicine

## 2021-04-07 DIAGNOSIS — E1142 Type 2 diabetes mellitus with diabetic polyneuropathy: Secondary | ICD-10-CM | POA: Diagnosis not present

## 2021-04-07 DIAGNOSIS — L729 Follicular cyst of the skin and subcutaneous tissue, unspecified: Secondary | ICD-10-CM

## 2021-04-07 DIAGNOSIS — R030 Elevated blood-pressure reading, without diagnosis of hypertension: Secondary | ICD-10-CM | POA: Diagnosis not present

## 2021-04-07 LAB — CBC WITH DIFFERENTIAL/PLATELET
Basophils Absolute: 0 10*3/uL (ref 0.0–0.1)
Basophils Relative: 0.5 % (ref 0.0–3.0)
Eosinophils Absolute: 0.1 10*3/uL (ref 0.0–0.7)
Eosinophils Relative: 1.1 % (ref 0.0–5.0)
HCT: 41.3 % (ref 39.0–52.0)
Hemoglobin: 14 g/dL (ref 13.0–17.0)
Lymphocytes Relative: 42.5 % (ref 12.0–46.0)
Lymphs Abs: 2.6 10*3/uL (ref 0.7–4.0)
MCHC: 33.8 g/dL (ref 30.0–36.0)
MCV: 96.4 fl (ref 78.0–100.0)
Monocytes Absolute: 0.8 10*3/uL (ref 0.1–1.0)
Monocytes Relative: 12.4 % — ABNORMAL HIGH (ref 3.0–12.0)
Neutro Abs: 2.7 10*3/uL (ref 1.4–7.7)
Neutrophils Relative %: 43.5 % (ref 43.0–77.0)
Platelets: 225 10*3/uL (ref 150.0–400.0)
RBC: 4.28 Mil/uL (ref 4.22–5.81)
RDW: 12.8 % (ref 11.5–15.5)
WBC: 6.1 10*3/uL (ref 4.0–10.5)

## 2021-04-07 LAB — COMPREHENSIVE METABOLIC PANEL
ALT: 18 U/L (ref 0–53)
AST: 22 U/L (ref 0–37)
Albumin: 4.4 g/dL (ref 3.5–5.2)
Alkaline Phosphatase: 50 U/L (ref 39–117)
BUN: 10 mg/dL (ref 6–23)
CO2: 31 mEq/L (ref 19–32)
Calcium: 9.8 mg/dL (ref 8.4–10.5)
Chloride: 101 mEq/L (ref 96–112)
Creatinine, Ser: 0.85 mg/dL (ref 0.40–1.50)
GFR: 83.54 mL/min (ref >60.00)
Glucose, Bld: 76 mg/dL (ref 70–99)
Potassium: 3.9 mEq/L (ref 3.5–5.1)
Sodium: 139 mEq/L (ref 135–145)
Total Bilirubin: 0.5 mg/dL (ref 0.2–1.2)
Total Protein: 7.8 g/dL (ref 6.0–8.3)

## 2021-04-07 LAB — HEMOGLOBIN A1C: Hgb A1c MFr Bld: 7 % — ABNORMAL HIGH (ref 4.6–6.5)

## 2021-04-07 MED ORDER — METHADONE HCL 10 MG PO TABS: 20.0000 mg | ORAL_TABLET | Freq: Two times a day (BID) | ORAL | 0 refills | Status: DC | PRN

## 2021-04-07 NOTE — Progress Notes (Signed)
 Subjective:  Patient ID: Gabriel Erickson, male    DOB: 12/26/1940  Age: 79 y.o. MRN: [id number]  CC: Follow-up (3 month f/u)   HPI Gabriel Erickson presents for DM, HTN, LBP C/o lesion on the R lower cheek  Outpatient Medications Prior to Visit  Medication Sig Dispense Refill   Alcohol Swabs PADS Use to clean finger before checking blood sugar level BID 200 each 3   b complex vitamins tablet Take 1 tablet by mouth daily.     Blood Glucose Calibration (TRUE METRIX LEVEL 2) Normal SOLN Use to calibrate meter 1 each 3   Blood Glucose Monitoring Suppl (TRUE METRIX METER) w/Device KIT Use to check blood sugar BID 1 kit 0   Cholecalciferol 1000 UNITS tablet Take 1,000 Units by mouth daily.     diazepam (VALIUM) 5 MG tablet TAKE ONE TABLET TWICE DAILY AS NEEDED FOR muscle spasms, cramps, or insomnia 60 tablet 2   famotidine (PEPCID) 40 MG tablet Take 1 tablet (40 mg total) by mouth daily. 90 tablet 3   fluticasone (FLONASE) 50 MCG/ACT nasal spray Place 2 sprays into the nose daily. 16 g 11   glimepiride (AMARYL) 4 MG tablet Take 1 tablet (4 mg total) by mouth 2 (two) times daily. 180 tablet 1   tadalafil (CIALIS) 20 MG tablet Take 0.5-1 tablets (10-20 mg total) by mouth daily as needed for erectile dysfunction. 30 tablet 3   TRUE METRIX BLOOD GLUCOSE TEST test strip TEST BLOOD SUGAR TWICE DAILY 200 strip 3   TRUEplus Lancets 30G MISC Use to check blood sugar BID 200 each 5   metFORMIN (GLUCOPHAGE) 500 MG tablet Take 1 tablet (500 mg total) by mouth daily with breakfast. 90 tablet 1   methadone (DOLOPHINE) 10 MG tablet Take 2 tablets (20 mg total) by mouth 2 (two) times daily as needed for severe pain. For pain M54.5 120 tablet 0   methadone (DOLOPHINE) 10 MG tablet Take 2 tablets (20 mg total) by mouth 2 (two) times daily as needed for severe pain. 120 tablet 0   methadone (DOLOPHINE) 10 MG tablet Take 2 tablets (20 mg total) by mouth 2 (two) times daily as needed for severe pain. 120 tablet 0   No  facility-administered medications prior to visit.    ROS: Review of Systems  Constitutional:  Negative for appetite change, fatigue and unexpected weight change.  HENT:  Negative for congestion, nosebleeds, sneezing, sore throat and trouble swallowing.   Eyes:  Negative for itching and visual disturbance.  Respiratory:  Negative for cough.   Cardiovascular:  Negative for chest pain, palpitations and leg swelling.  Gastrointestinal:  Negative for abdominal distention, blood in stool, diarrhea and nausea.  Genitourinary:  Negative for frequency and hematuria.  Musculoskeletal:  Positive for back pain and gait problem. Negative for joint swelling and neck pain.  Skin:  Positive for color change. Negative for rash.  Neurological:  Negative for dizziness, tremors, speech difficulty and weakness.  Psychiatric/Behavioral:  Negative for agitation, dysphoric mood and sleep disturbance. The patient is not nervous/anxious.    Objective:  BP (!) 152/78 (BP Location: Left Arm)   Pulse 79   Temp 98.4 F (36.9 C) (Oral)   Ht 6' (1.829 m)   Wt 166 lb 9.6 oz (75.6 kg)   SpO2 97%   BMI 22.60 kg/m   BP Readings from Last 3 Encounters:  04/07/21 (!) 152/78  01/05/21 (!) 158/78  10/06/20 (!) 162/84    Wt Readings from   Last 3 Encounters:  04/07/21 166 lb 9.6 oz (75.6 kg)  01/05/21 163 lb 9.6 oz (74.2 kg)  10/06/20 167 lb 6.4 oz (75.9 kg)    Physical Exam Constitutional:      General: He is not in acute distress.    Appearance: He is well-developed.     Comments: NAD  Eyes:     Conjunctiva/sclera: Conjunctivae normal.     Pupils: Pupils are equal, round, and reactive to light.  Neck:     Thyroid: No thyromegaly.     Vascular: No JVD.  Cardiovascular:     Rate and Rhythm: Normal rate and regular rhythm.     Heart sounds: Normal heart sounds. No murmur heard.   No friction rub. No gallop.  Pulmonary:     Effort: Pulmonary effort is normal. No respiratory distress.     Breath sounds:  Normal breath sounds. No wheezing or rales.  Chest:     Chest wall: No tenderness.  Abdominal:     General: Bowel sounds are normal. There is no distension.     Palpations: Abdomen is soft. There is no mass.     Tenderness: There is no abdominal tenderness. There is no guarding or rebound.  Musculoskeletal:        General: Tenderness present. Normal range of motion.     Cervical back: Normal range of motion.  Lymphadenopathy:     Cervical: No cervical adenopathy.  Skin:    General: Skin is warm and dry.     Findings: No rash.  Neurological:     Mental Status: He is alert and oriented to person, place, and time.     Cranial Nerves: No cranial nerve deficit.     Motor: No abnormal muscle tone.     Coordination: Coordination normal.     Gait: Gait normal.     Deep Tendon Reflexes: Reflexes are normal and symmetric.  Psychiatric:        Behavior: Behavior normal.        Thought Content: Thought content normal.        Judgment: Judgment normal.  R cheek skin cyst  Cyst under R eye -infected 1x2 mm  Procedure note:  Incision and Drainage of a cyst/early abscess   Indication : a localized collection of pus that is tender and not spontaneously resolving.    Risks including unsuccessful procedure , possible need for a repeat procedure due to pus accumulation, scar formation, and others as well as benefits were explained to the patient in detail. Written consent was obtained/signed.    The patient was placed in a decubitus position. The area of an abscess was prepped with povidone-iodine. No local anesthesia was administered.  2 mm incision with #11strait blade was made. About 0.1 cc of 80 purulent material was expressed. The abscess cavity was explored  and expressed bluntly. The cavity was cleaned.  The wound was dressed with antibiotic ointment .  Tolerated well. Complications: None.   Wound instructions provided.     Lab Results  Component Value Date   WBC 6.1 04/07/2021    HGB 14.0 04/07/2021   HCT 41.3 04/07/2021   PLT 225.0 04/07/2021   GLUCOSE 76 04/07/2021   CHOL 220 (H) 07/01/2019   TRIG 188.0 (H) 07/01/2019   HDL 37.60 (L) 07/01/2019   LDLDIRECT 142.3 04/12/2010   LDLCALC 145 (H) 07/01/2019   ALT 18 04/07/2021   AST 22 04/07/2021   NA 139 04/07/2021   K 3.9 04/07/2021     CL 101 04/07/2021   CREATININE 0.85 04/07/2021   BUN 10 04/07/2021   CO2 31 04/07/2021   TSH 0.51 07/01/2019   PSA 1.65 06/30/2020   HGBA1C 7.0 (H) 04/07/2021   MICROALBUR <0.7 06/19/2017    CT Chest W Contrast  Result Date: 05/25/2018 CLINICAL DATA:  Follow-up colon cancer. EXAM: CT CHEST, ABDOMEN, AND PELVIS WITH CONTRAST TECHNIQUE: Multidetector CT imaging of the chest, abdomen and pelvis was performed following the standard protocol during bolus administration of intravenous contrast. CONTRAST:  100mL OMNIPAQUE IOHEXOL 300 MG/ML  SOLN COMPARISON:  05/24/2017 FINDINGS: CT CHEST FINDINGS Cardiovascular: Normal heart size.  No pericardial effusion. Mediastinum/Nodes: Multinodular thyroid gland is again noted. Dominant nodule in the right lobe measures 2.2 cm, image 9/2. The trachea appears patent and is midline. Normal appearance of the esophagus. No supraclavicular or axillary adenopathy. No mediastinal or hilar adenopathy. Lungs/Pleura: No pleural effusion. Stable calcified nodule in the right upper lobe measuring 6 mm, image 49/4. Unchanged. Small anterior right upper lobe lung nodule measuring 5 mm is stable, image 66/4. New right upper lobe lung nodule measures 2 mm, image 64/4. Right upper lobe perifissural nodule is stable measuring 5 mm, image 76/4. No additional pulmonary nodules identified. Musculoskeletal: No aggressive lytic or sclerotic bone lesions identified. CT ABDOMEN PELVIS FINDINGS Hepatobiliary: There are no suspicious liver lesions. The gallbladder appears normal. No biliary ductal dilatation. Pancreas: Scattered pancreatic calcifications are noted which may be the  sequelae of chronic pancreatitis. No pancreatic inflammation, main duct dilatation or mass noted. Spleen: Spleen appears normal. Adrenals/Urinary Tract: The adrenal glands are unremarkable. Right kidney is normal. Small low-density structure within the inferior pole of left kidney measures 5 mm and is too small to characterize. Urinary bladder appears within normal limits. Stomach/Bowel: Stomach is normal. The small bowel loops have a normal caliber. Postoperative changes from right hemicolectomy identified. Enterocolonic anastomosis. No specific findings at the anastomosis to suggest local tumor recurrence or complication. No pathologic dilatation of the remaining colon. Distal colonic diverticulosis without acute inflammation. Vascular/Lymphatic: Normal appearance of the abdominal aorta. No enlarged retroperitoneal or mesenteric adenopathy. No enlarged pelvic or inguinal lymph nodes. Reproductive: Prostate is unremarkable. Other: No abdominal wall hernia or abnormality. No abdominopelvic ascites. Musculoskeletal: No acute or significant osseous findings. IMPRESSION: 1. No significant interval change compared with 05/24/2017. No findings highly concerning for recurrent or metastatic disease status post right hemicolectomy. 2. Previously noted calcified and noncalcified nodules are stable when compared with the previous exam. A new tiny nodule within the right upper lobe measures 2-3 mm, nonspecific. This warrants attention on follow-up imaging. 3. Calcifications within the pancreas which may reflect chronic pancreatitis. 4. Multinodular thyroid gland with dominant nodule in the right lobe. Consider further evaluation with thyroid ultrasound. If patient is clinically hyperthyroid, consider nuclear medicine thyroid uptake and scan. Electronically Signed   By: Taylor  Stroud M.D.   On: 05/25/2018 16:48   CT Abdomen Pelvis W Contrast  Result Date: 05/25/2018 CLINICAL DATA:  Follow-up colon cancer. EXAM: CT CHEST,  ABDOMEN, AND PELVIS WITH CONTRAST TECHNIQUE: Multidetector CT imaging of the chest, abdomen and pelvis was performed following the standard protocol during bolus administration of intravenous contrast. CONTRAST:  100mL OMNIPAQUE IOHEXOL 300 MG/ML  SOLN COMPARISON:  05/24/2017 FINDINGS: CT CHEST FINDINGS Cardiovascular: Normal heart size.  No pericardial effusion. Mediastinum/Nodes: Multinodular thyroid gland is again noted. Dominant nodule in the right lobe measures 2.2 cm, image 9/2. The trachea appears patent and is midline. Normal appearance of the esophagus.   No supraclavicular or axillary adenopathy. No mediastinal or hilar adenopathy. Lungs/Pleura: No pleural effusion. Stable calcified nodule in the right upper lobe measuring 6 mm, image 49/4. Unchanged. Small anterior right upper lobe lung nodule measuring 5 mm is stable, image 66/4. New right upper lobe lung nodule measures 2 mm, image 64/4. Right upper lobe perifissural nodule is stable measuring 5 mm, image 76/4. No additional pulmonary nodules identified. Musculoskeletal: No aggressive lytic or sclerotic bone lesions identified. CT ABDOMEN PELVIS FINDINGS Hepatobiliary: There are no suspicious liver lesions. The gallbladder appears normal. No biliary ductal dilatation. Pancreas: Scattered pancreatic calcifications are noted which may be the sequelae of chronic pancreatitis. No pancreatic inflammation, main duct dilatation or mass noted. Spleen: Spleen appears normal. Adrenals/Urinary Tract: The adrenal glands are unremarkable. Right kidney is normal. Small low-density structure within the inferior pole of left kidney measures 5 mm and is too small to characterize. Urinary bladder appears within normal limits. Stomach/Bowel: Stomach is normal. The small bowel loops have a normal caliber. Postoperative changes from right hemicolectomy identified. Enterocolonic anastomosis. No specific findings at the anastomosis to suggest local tumor recurrence or  complication. No pathologic dilatation of the remaining colon. Distal colonic diverticulosis without acute inflammation. Vascular/Lymphatic: Normal appearance of the abdominal aorta. No enlarged retroperitoneal or mesenteric adenopathy. No enlarged pelvic or inguinal lymph nodes. Reproductive: Prostate is unremarkable. Other: No abdominal wall hernia or abnormality. No abdominopelvic ascites. Musculoskeletal: No acute or significant osseous findings. IMPRESSION: 1. No significant interval change compared with 05/24/2017. No findings highly concerning for recurrent or metastatic disease status post right hemicolectomy. 2. Previously noted calcified and noncalcified nodules are stable when compared with the previous exam. A new tiny nodule within the right upper lobe measures 2-3 mm, nonspecific. This warrants attention on follow-up imaging. 3. Calcifications within the pancreas which may reflect chronic pancreatitis. 4. Multinodular thyroid gland with dominant nodule in the right lobe. Consider further evaluation with thyroid ultrasound. If patient is clinically hyperthyroid, consider nuclear medicine thyroid uptake and scan. Electronically Signed   By: Kerby Moors M.D.   On: 05/25/2018 16:48    Assessment & Plan:   Problem List Items Addressed This Visit     Diabetes mellitus type 2, controlled ([place])    CBGs are better Check A1c Cont on Glimepiride, Metformin      Relevant Orders   Hemoglobin A1c (Completed)   Comprehensive metabolic panel (Completed)   Elevated BP without diagnosis of hypertension    BP OK at home      Hemochromatosis    Will monitor CBC      Relevant Orders   Hemoglobin A1c (Completed)   Comprehensive metabolic panel (Completed)   CBC with Differential/Platelet (Completed)   Skin cyst    Probably infected.  See procedure-simple incision and drainage         Follow-up: Return in about 3 months (around 07/07/2021) for a follow-up visit.  Walker Kehr, MD

## 2021-04-07 NOTE — Assessment & Plan Note (Signed)
BP OK at home

## 2021-04-07 NOTE — Assessment & Plan Note (Signed)
Will monitor CBC

## 2021-04-07 NOTE — Assessment & Plan Note (Signed)
CBGs are better Check A1c Cont on Glimepiride, Metformin

## 2021-04-13 ENCOUNTER — Telehealth: Payer: Self-pay

## 2021-04-13 MED ORDER — METFORMIN HCL 500 MG PO TABS: 500.0000 mg | ORAL_TABLET | Freq: Every day | ORAL | 1 refills | Status: DC

## 2021-04-13 NOTE — Addendum Note (Signed)
Addended by: Earnstine Regal on: 04/13/2021 03:57 PM   Modules accepted: Orders

## 2021-04-13 NOTE — Telephone Encounter (Signed)
Reviewed chart pt is up-to-date sent refills to [facility].../lmb  

## 2021-04-15 NOTE — Assessment & Plan Note (Signed)
Probably infected.  See procedure-simple incision and drainage

## 2021-04-23 ENCOUNTER — Other Ambulatory Visit: Payer: Self-pay | Admitting: *Deleted

## 2021-04-23 MED ORDER — GLIMEPIRIDE 4 MG PO TABS: 4.0000 mg | ORAL_TABLET | Freq: Two times a day (BID) | ORAL | 1 refills | Status: DC

## 2021-05-05 ENCOUNTER — Other Ambulatory Visit: Payer: Self-pay | Admitting: *Deleted

## 2021-05-05 MED ORDER — ALCOHOL SWABS PADS: MEDICATED_PAD | 3 refills | Status: DC

## 2021-06-04 DIAGNOSIS — H26493 Other secondary cataract, bilateral: Secondary | ICD-10-CM | POA: Diagnosis not present

## 2021-06-04 LAB — HM DIABETES EYE EXAM

## 2021-06-21 DIAGNOSIS — H26493 Other secondary cataract, bilateral: Secondary | ICD-10-CM | POA: Diagnosis not present

## 2021-07-06 DIAGNOSIS — H43393 Other vitreous opacities, bilateral: Secondary | ICD-10-CM | POA: Diagnosis not present

## 2021-07-06 DIAGNOSIS — Z7984 Long term (current) use of oral hypoglycemic drugs: Secondary | ICD-10-CM | POA: Diagnosis not present

## 2021-07-06 DIAGNOSIS — Z961 Presence of intraocular lens: Secondary | ICD-10-CM | POA: Diagnosis not present

## 2021-07-06 DIAGNOSIS — H26493 Other secondary cataract, bilateral: Secondary | ICD-10-CM | POA: Diagnosis not present

## 2021-07-06 DIAGNOSIS — E119 Type 2 diabetes mellitus without complications: Secondary | ICD-10-CM | POA: Diagnosis not present

## 2021-07-06 DIAGNOSIS — H524 Presbyopia: Secondary | ICD-10-CM | POA: Diagnosis not present

## 2021-07-07 ENCOUNTER — Other Ambulatory Visit: Payer: Self-pay

## 2021-07-07 ENCOUNTER — Encounter: Payer: Self-pay | Admitting: Internal Medicine

## 2021-07-07 ENCOUNTER — Ambulatory Visit: Payer: Medicare PPO | Admitting: Internal Medicine

## 2021-07-07 VITALS — BP 148/82 | HR 87 | Temp 98.2°F | Ht 72.0 in | Wt 166.4 lb

## 2021-07-07 DIAGNOSIS — M5440 Lumbago with sciatica, unspecified side: Secondary | ICD-10-CM | POA: Diagnosis not present

## 2021-07-07 DIAGNOSIS — Z125 Encounter for screening for malignant neoplasm of prostate: Secondary | ICD-10-CM | POA: Diagnosis not present

## 2021-07-07 DIAGNOSIS — G8929 Other chronic pain: Secondary | ICD-10-CM | POA: Diagnosis not present

## 2021-07-07 DIAGNOSIS — Z0000 Encounter for general adult medical examination without abnormal findings: Secondary | ICD-10-CM | POA: Diagnosis not present

## 2021-07-07 DIAGNOSIS — Z85038 Personal history of other malignant neoplasm of large intestine: Secondary | ICD-10-CM | POA: Diagnosis not present

## 2021-07-07 DIAGNOSIS — E1142 Type 2 diabetes mellitus with diabetic polyneuropathy: Secondary | ICD-10-CM | POA: Diagnosis not present

## 2021-07-07 LAB — COMPREHENSIVE METABOLIC PANEL
ALT: 21 U/L (ref 0–53)
AST: 22 U/L (ref 0–37)
Albumin: 4.4 g/dL (ref 3.5–5.2)
Alkaline Phosphatase: 61 U/L (ref 39–117)
BUN: 12 mg/dL (ref 6–23)
CO2: 33 mEq/L — ABNORMAL HIGH (ref 19–32)
Calcium: 9.6 mg/dL (ref 8.4–10.5)
Chloride: 100 mEq/L (ref 96–112)
Creatinine, Ser: 0.8 mg/dL (ref 0.40–1.50)
GFR: 84.94 mL/min (ref >60.00)
Glucose, Bld: 144 mg/dL — ABNORMAL HIGH (ref 70–99)
Potassium: 4.7 mEq/L (ref 3.5–5.1)
Sodium: 138 mEq/L (ref 135–145)
Total Bilirubin: 0.7 mg/dL (ref 0.2–1.2)
Total Protein: 7.4 g/dL (ref 6.0–8.3)

## 2021-07-07 LAB — HEMOGLOBIN A1C: Hgb A1c MFr Bld: 8.3 % — ABNORMAL HIGH (ref 4.6–6.5)

## 2021-07-07 LAB — URINALYSIS
Bilirubin Urine: NEGATIVE
Hgb urine dipstick: NEGATIVE
Ketones, ur: NEGATIVE
Leukocytes,Ua: NEGATIVE
Nitrite: NEGATIVE
Specific Gravity, Urine: 1.02 (ref 1.000–1.030)
Total Protein, Urine: NEGATIVE
Urine Glucose: NEGATIVE
Urobilinogen, UA: 0.2 (ref 0.0–1.0)
pH: 6 (ref 5.0–8.0)

## 2021-07-07 LAB — CBC WITH DIFFERENTIAL/PLATELET
Basophils Absolute: 0 10*3/uL (ref 0.0–0.1)
Basophils Relative: 0.6 % (ref 0.0–3.0)
Eosinophils Absolute: 0 10*3/uL (ref 0.0–0.7)
Eosinophils Relative: 0.7 % (ref 0.0–5.0)
HCT: 44.5 % (ref 39.0–52.0)
Hemoglobin: 15.2 g/dL (ref 13.0–17.0)
Lymphocytes Relative: 30 % (ref 12.0–46.0)
Lymphs Abs: 1.9 10*3/uL (ref 0.7–4.0)
MCHC: 34.1 g/dL (ref 30.0–36.0)
MCV: 95.9 fl (ref 78.0–100.0)
Monocytes Absolute: 0.6 10*3/uL (ref 0.1–1.0)
Monocytes Relative: 10.3 % (ref 3.0–12.0)
Neutro Abs: 3.6 10*3/uL (ref 1.4–7.7)
Neutrophils Relative %: 58.4 % (ref 43.0–77.0)
Platelets: 229 10*3/uL (ref 150.0–400.0)
RBC: 4.64 Mil/uL (ref 4.22–5.81)
RDW: 12.4 % (ref 11.5–15.5)
WBC: 6.2 10*3/uL (ref 4.0–10.5)

## 2021-07-07 LAB — TSH: TSH: 0.46 u[IU]/mL (ref 0.35–5.50)

## 2021-07-07 LAB — FERRITIN: Ferritin: 414.7 ng/mL — ABNORMAL HIGH (ref 22.0–322.0)

## 2021-07-07 LAB — PSA: PSA: 2.32 ng/mL (ref 0.10–4.00)

## 2021-07-07 MED ORDER — ALCOHOL SWABS PADS: MEDICATED_PAD | 3 refills | Status: DC

## 2021-07-07 MED ORDER — METFORMIN HCL 500 MG PO TABS: 500.0000 mg | ORAL_TABLET | Freq: Two times a day (BID) | ORAL | 3 refills | Status: DC

## 2021-07-07 MED ORDER — TRUEPLUS LANCETS 30G MISC: 5 refills | Status: DC

## 2021-07-07 MED ORDER — DIAZEPAM 5 MG PO TABS: ORAL_TABLET | ORAL | 2 refills | Status: DC

## 2021-07-07 MED ORDER — METHADONE HCL 10 MG PO TABS: 20.0000 mg | ORAL_TABLET | Freq: Two times a day (BID) | ORAL | 0 refills | Status: DC | PRN

## 2021-07-07 MED ORDER — TRUE METRIX LEVEL 2 NORMAL VI SOLN: 3 refills | Status: AC

## 2021-07-07 NOTE — Progress Notes (Signed)
Subjective:  Patient ID: Gabriel Erickson, male    DOB: [birth year]  Age: 80 y.o. MRN: [id number]  CC: Follow-up (3 month f/u- pt states his BS ben running elevated want to discuss)   HPI Oakes Mccready Pixley presents for DM - CBGs are up, LBP, GERD   Outpatient Medications Prior to Visit  Medication Sig Dispense Refill   b complex vitamins tablet Take 1 tablet by mouth daily.     Blood Glucose Monitoring Suppl (TRUE METRIX METER) w/Device KIT Use to check blood sugar BID 1 kit 0   Cholecalciferol 1000 UNITS tablet Take 1,000 Units by mouth daily.     famotidine (PEPCID) 40 MG tablet Take 1 tablet (40 mg total) by mouth daily. 90 tablet 3   fluticasone (FLONASE) 50 MCG/ACT nasal spray Place 2 sprays into the nose daily. 16 g 11   glimepiride (AMARYL) 4 MG tablet Take 1 tablet (4 mg total) by mouth 2 (two) times daily. 180 tablet 1   tadalafil (CIALIS) 20 MG tablet Take 0.5-1 tablets (10-20 mg total) by mouth daily as needed for erectile dysfunction. 30 tablet 3   TRUE METRIX BLOOD GLUCOSE TEST test strip TEST BLOOD SUGAR TWICE DAILY 200 strip 3   Alcohol Swabs PADS Use to clean finger before checking blood sugar level BID 200 each 3   Blood Glucose Calibration (TRUE METRIX LEVEL 2) Normal SOLN Use to calibrate meter 1 each 3   diazepam (VALIUM) 5 MG tablet TAKE ONE TABLET TWICE DAILY AS NEEDED FOR muscle spasms, cramps, or insomnia 60 tablet 2   metFORMIN (GLUCOPHAGE) 500 MG tablet Take 1 tablet (500 mg total) by mouth daily with breakfast. 90 tablet 1   methadone (DOLOPHINE) 10 MG tablet Take 2 tablets (20 mg total) by mouth 2 (two) times daily as needed for severe pain. 120 tablet 0   methadone (DOLOPHINE) 10 MG tablet Take 2 tablets (20 mg total) by mouth 2 (two) times daily as needed for severe pain. 120 tablet 0   methadone (DOLOPHINE) 10 MG tablet Take 2 tablets (20 mg total) by mouth 2 (two) times daily as needed for severe pain. For pain M54.5 120 tablet 0   TRUEplus Lancets 30G MISC Use to  check blood sugar BID 200 each 5   No facility-administered medications prior to visit.    ROS: Review of Systems  Constitutional:  Negative for appetite change, fatigue and unexpected weight change.  HENT:  Negative for congestion, nosebleeds, sneezing, sore throat and trouble swallowing.   Eyes:  Negative for itching and visual disturbance.  Respiratory:  Negative for cough.   Cardiovascular:  Negative for chest pain, palpitations and leg swelling.  Gastrointestinal:  Negative for abdominal distention, blood in stool, diarrhea and nausea.  Genitourinary:  Negative for frequency and hematuria.  Musculoskeletal:  Positive for back pain and gait problem. Negative for joint swelling and neck pain.  Skin:  Negative for rash.  Neurological:  Negative for dizziness, tremors, speech difficulty and weakness.  Psychiatric/Behavioral:  Negative for agitation, dysphoric mood, sleep disturbance and suicidal ideas. The patient is not nervous/anxious.    Objective:  BP (!) 148/82 (BP Location: Left Arm)    Pulse 87    Temp 98.2 F (36.8 C) (Oral)    Ht 6' (1.829 m)    Wt 166 lb 6.4 oz (75.5 kg)    SpO2 97%    BMI 22.57 kg/m   BP Readings from Last 3 Encounters:  07/07/21 (!) 148/82  04/07/21 Marland Kitchen)  152/78  01/05/21 (!) 158/78    Wt Readings from Last 3 Encounters:  07/07/21 166 lb 6.4 oz (75.5 kg)  04/07/21 166 lb 9.6 oz (75.6 kg)  01/05/21 163 lb 9.6 oz (74.2 kg)    Physical Exam Constitutional:      General: He is not in acute distress.    Appearance: He is well-developed.     Comments: NAD  Eyes:     Conjunctiva/sclera: Conjunctivae normal.     Pupils: Pupils are equal, round, and reactive to light.  Neck:     Thyroid: No thyromegaly.     Vascular: No JVD.  Cardiovascular:     Rate and Rhythm: Normal rate and regular rhythm.     Heart sounds: Normal heart sounds. No murmur heard.   No friction rub. No gallop.  Pulmonary:     Effort: Pulmonary effort is normal. No respiratory  distress.     Breath sounds: Normal breath sounds. No wheezing or rales.  Chest:     Chest wall: No tenderness.  Abdominal:     General: Bowel sounds are normal. There is no distension.     Palpations: Abdomen is soft. There is no mass.     Tenderness: There is no abdominal tenderness. There is no guarding or rebound.  Musculoskeletal:        General: Tenderness present. Normal range of motion.     Cervical back: Normal range of motion.  Lymphadenopathy:     Cervical: No cervical adenopathy.  Skin:    General: Skin is warm and dry.     Findings: No rash.  Neurological:     Mental Status: He is alert and oriented to person, place, and time.     Cranial Nerves: No cranial nerve deficit.     Motor: Weakness present. No abnormal muscle tone.     Coordination: Coordination normal.     Gait: Gait abnormal.     Deep Tendon Reflexes: Reflexes are normal and symmetric.  Psychiatric:        Behavior: Behavior normal.        Thought Content: Thought content normal.        Judgment: Judgment normal.  Weak LEs LS w/pain  Lab Results  Component Value Date   WBC 6.1 04/07/2021   HGB 14.0 04/07/2021   HCT 41.3 04/07/2021   PLT 225.0 04/07/2021   GLUCOSE 76 04/07/2021   CHOL 220 (H) 07/01/2019   TRIG 188.0 (H) 07/01/2019   HDL 37.60 (L) 07/01/2019   LDLDIRECT 142.3 04/12/2010   LDLCALC 145 (H) 07/01/2019   ALT 18 04/07/2021   AST 22 04/07/2021   NA 139 04/07/2021   K 3.9 04/07/2021   CL 101 04/07/2021   CREATININE 0.85 04/07/2021   BUN 10 04/07/2021   CO2 31 04/07/2021   TSH 0.51 07/01/2019   PSA 1.65 06/30/2020   HGBA1C 7.0 (H) 04/07/2021   MICROALBUR <0.7 06/19/2017    CT Chest W Contrast  Result Date: 05/25/2018 CLINICAL DATA:  Follow-up colon cancer. EXAM: CT CHEST, ABDOMEN, AND PELVIS WITH CONTRAST TECHNIQUE: Multidetector CT imaging of the chest, abdomen and pelvis was performed following the standard protocol during bolus administration of intravenous contrast.  CONTRAST:  159m OMNIPAQUE IOHEXOL 300 MG/ML  SOLN COMPARISON:  05/24/2017 FINDINGS: CT CHEST FINDINGS Cardiovascular: Normal heart size.  No pericardial effusion. Mediastinum/Nodes: Multinodular thyroid gland is again noted. Dominant nodule in the right lobe measures 2.2 cm, image 9/2. The trachea appears patent and is midline. Normal appearance  of the esophagus. No supraclavicular or axillary adenopathy. No mediastinal or hilar adenopathy. Lungs/Pleura: No pleural effusion. Stable calcified nodule in the right upper lobe measuring 6 mm, image 49/4. Unchanged. Small anterior right upper lobe lung nodule measuring 5 mm is stable, image 66/4. New right upper lobe lung nodule measures 2 mm, image 64/4. Right upper lobe perifissural nodule is stable measuring 5 mm, image 76/4. No additional pulmonary nodules identified. Musculoskeletal: No aggressive lytic or sclerotic bone lesions identified. CT ABDOMEN PELVIS FINDINGS Hepatobiliary: There are no suspicious liver lesions. The gallbladder appears normal. No biliary ductal dilatation. Pancreas: Scattered pancreatic calcifications are noted which may be the sequelae of chronic pancreatitis. No pancreatic inflammation, main duct dilatation or mass noted. Spleen: Spleen appears normal. Adrenals/Urinary Tract: The adrenal glands are unremarkable. Right kidney is normal. Small low-density structure within the inferior pole of left kidney measures 5 mm and is too small to characterize. Urinary bladder appears within normal limits. Stomach/Bowel: Stomach is normal. The small bowel loops have a normal caliber. Postoperative changes from right hemicolectomy identified. Enterocolonic anastomosis. No specific findings at the anastomosis to suggest local tumor recurrence or complication. No pathologic dilatation of the remaining colon. Distal colonic diverticulosis without acute inflammation. Vascular/Lymphatic: Normal appearance of the abdominal aorta. No enlarged retroperitoneal  or mesenteric adenopathy. No enlarged pelvic or inguinal lymph nodes. Reproductive: Prostate is unremarkable. Other: No abdominal wall hernia or abnormality. No abdominopelvic ascites. Musculoskeletal: No acute or significant osseous findings. IMPRESSION: 1. No significant interval change compared with 05/24/2017. No findings highly concerning for recurrent or metastatic disease status post right hemicolectomy. 2. Previously noted calcified and noncalcified nodules are stable when compared with the previous exam. A new tiny nodule within the right upper lobe measures 2-3 mm, nonspecific. This warrants attention on follow-up imaging. 3. Calcifications within the pancreas which may reflect chronic pancreatitis. 4. Multinodular thyroid gland with dominant nodule in the right lobe. Consider further evaluation with thyroid ultrasound. If patient is clinically hyperthyroid, consider nuclear medicine thyroid uptake and scan. Electronically Signed   By: Kerby Moors M.D.   On: 05/25/2018 16:48   CT Abdomen Pelvis W Contrast  Result Date: 05/25/2018 CLINICAL DATA:  Follow-up colon cancer. EXAM: CT CHEST, ABDOMEN, AND PELVIS WITH CONTRAST TECHNIQUE: Multidetector CT imaging of the chest, abdomen and pelvis was performed following the standard protocol during bolus administration of intravenous contrast. CONTRAST:  166m OMNIPAQUE IOHEXOL 300 MG/ML  SOLN COMPARISON:  05/24/2017 FINDINGS: CT CHEST FINDINGS Cardiovascular: Normal heart size.  No pericardial effusion. Mediastinum/Nodes: Multinodular thyroid gland is again noted. Dominant nodule in the right lobe measures 2.2 cm, image 9/2. The trachea appears patent and is midline. Normal appearance of the esophagus. No supraclavicular or axillary adenopathy. No mediastinal or hilar adenopathy. Lungs/Pleura: No pleural effusion. Stable calcified nodule in the right upper lobe measuring 6 mm, image 49/4. Unchanged. Small anterior right upper lobe lung nodule measuring 5 mm  is stable, image 66/4. New right upper lobe lung nodule measures 2 mm, image 64/4. Right upper lobe perifissural nodule is stable measuring 5 mm, image 76/4. No additional pulmonary nodules identified. Musculoskeletal: No aggressive lytic or sclerotic bone lesions identified. CT ABDOMEN PELVIS FINDINGS Hepatobiliary: There are no suspicious liver lesions. The gallbladder appears normal. No biliary ductal dilatation. Pancreas: Scattered pancreatic calcifications are noted which may be the sequelae of chronic pancreatitis. No pancreatic inflammation, main duct dilatation or mass noted. Spleen: Spleen appears normal. Adrenals/Urinary Tract: The adrenal glands are unremarkable. Right kidney is normal. Small  low-density structure within the inferior pole of left kidney measures 5 mm and is too small to characterize. Urinary bladder appears within normal limits. Stomach/Bowel: Stomach is normal. The small bowel loops have a normal caliber. Postoperative changes from right hemicolectomy identified. Enterocolonic anastomosis. No specific findings at the anastomosis to suggest local tumor recurrence or complication. No pathologic dilatation of the remaining colon. Distal colonic diverticulosis without acute inflammation. Vascular/Lymphatic: Normal appearance of the abdominal aorta. No enlarged retroperitoneal or mesenteric adenopathy. No enlarged pelvic or inguinal lymph nodes. Reproductive: Prostate is unremarkable. Other: No abdominal wall hernia or abnormality. No abdominopelvic ascites. Musculoskeletal: No acute or significant osseous findings. IMPRESSION: 1. No significant interval change compared with 05/24/2017. No findings highly concerning for recurrent or metastatic disease status post right hemicolectomy. 2. Previously noted calcified and noncalcified nodules are stable when compared with the previous exam. A new tiny nodule within the right upper lobe measures 2-3 mm, nonspecific. This warrants attention on  follow-up imaging. 3. Calcifications within the pancreas which may reflect chronic pancreatitis. 4. Multinodular thyroid gland with dominant nodule in the right lobe. Consider further evaluation with thyroid ultrasound. If patient is clinically hyperthyroid, consider nuclear medicine thyroid uptake and scan. Electronically Signed   By: Kerby Moors M.D.   On: 05/25/2018 16:48    Assessment & Plan:   Problem List Items Addressed This Visit     Diabetes mellitus type 2, controlled ([place]) - Primary    Worse. Cont Glimepiride. 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 addiction risk, apnea etc) and complications (i.e. Somnolence, constipation and others) were explained to the patient and were aknowledged. 3/20 - reducing the dose per Tyri's request w/caution      Relevant Medications   methadone (DOLOPHINE) 10 MG tablet   methadone (DOLOPHINE) 10 MG tablet   methadone (DOLOPHINE) 10 MG tablet   Other Relevant Orders   TSH   Well adult exam   Relevant Orders   TSH   Urinalysis   CBC with Differential/Platelet   PSA   Comprehensive metabolic panel      Meds ordered this encounter  Medications   metFORMIN (GLUCOPHAGE) 500 MG tablet    Sig: Take 1 tablet (500 mg total) by mouth 2 (two) times daily with a meal.    Dispense:  180 tablet    Refill:  3   methadone (DOLOPHINE) 10 MG tablet    Sig: Take 2 tablets (20 mg total) by mouth 2 (two) times daily as needed for severe  pain. For pain M54.5    Dispense:  120 tablet    Refill:  0    Please fill on or after 07/21/21   methadone (DOLOPHINE) 10 MG tablet    Sig: Take 2 tablets (20 mg total) by mouth 2 (two) times daily as needed for severe pain.  Dispense:  120 tablet    Refill:  0    Please fill on or after 08/20/21   methadone (DOLOPHINE) 10 MG tablet    Sig: Take 2 tablets (20 mg total) by mouth 2 (two) times daily as needed for severe pain.    Dispense:  120 tablet    Refill:  0    Please fill on or after 09/19/21   diazepam (VALIUM) 5 MG tablet    Sig: TAKE ONE TABLET TWICE DAILY AS NEEDED FOR muscle spasms, cramps, or insomnia    Dispense:  60 tablet    Refill:  2   Alcohol Swabs PADS    Sig: Use to clean finger before checking blood sugar level BID    Dispense:  200 each    Refill:  3    Dx E11.9   TRUEplus Lancets 30G MISC    Sig: Use to check blood sugar BID    Dispense:  200 each    Refill:  5    DX E11.9   Blood Glucose Calibration (TRUE METRIX LEVEL 2) Normal SOLN    Sig: Use to calibrate meter    Dispense:  1 each    Refill:  3      Follow-up: Return in about 3 months (around 10/05/2021) for Wellness Exam.  Walker Kehr, MD

## 2021-07-07 NOTE — Assessment & Plan Note (Addendum)
Monitor CBC, AFP, ferritin

## 2021-07-07 NOTE — Assessment & Plan Note (Signed)
Check CEA 

## 2021-07-07 NOTE — Assessment & Plan Note (Signed)
Worse. Cont Glimepiride. Increase Metformin to bid

## 2021-07-07 NOTE — Assessment & Plan Note (Signed)
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 addiction risk, apnea etc) and complications (i.e. Somnolence, constipation and others) were explained to the patient and were aknowledged. 3/20 - reducing the dose per Gabriel Erickson's request w/caution

## 2021-07-08 LAB — CEA: CEA: 2.9 ng/mL — ABNORMAL HIGH

## 2021-07-08 LAB — AFP TUMOR MARKER: AFP-Tumor Marker: 4.3 ng/mL (ref <6.1)

## 2021-07-15 ENCOUNTER — Other Ambulatory Visit: Payer: Self-pay | Admitting: Internal Medicine

## 2021-07-15 DIAGNOSIS — R970 Elevated carcinoembryonic antigen [CEA]: Secondary | ICD-10-CM

## 2021-07-15 DIAGNOSIS — Z85038 Personal history of other malignant neoplasm of large intestine: Secondary | ICD-10-CM

## 2021-07-23 ENCOUNTER — Other Ambulatory Visit: Payer: Self-pay

## 2021-07-23 ENCOUNTER — Ambulatory Visit (INDEPENDENT_AMBULATORY_CARE_PROVIDER_SITE_OTHER)
Admission: RE | Admit: 2021-07-23 | Discharge: 2021-07-23 | Disposition: A | Discharge status: Home / Self Care | Payer: Medicare PPO | Source: Ambulatory Visit | Attending: Internal Medicine | Admitting: Internal Medicine

## 2021-07-23 DIAGNOSIS — J948 Other specified pleural conditions: Secondary | ICD-10-CM | POA: Diagnosis not present

## 2021-07-23 DIAGNOSIS — Z85038 Personal history of other malignant neoplasm of large intestine: Secondary | ICD-10-CM | POA: Diagnosis not present

## 2021-07-23 DIAGNOSIS — K8689 Other specified diseases of pancreas: Secondary | ICD-10-CM | POA: Diagnosis not present

## 2021-07-23 DIAGNOSIS — J984 Other disorders of lung: Secondary | ICD-10-CM | POA: Diagnosis not present

## 2021-07-23 DIAGNOSIS — C189 Malignant neoplasm of colon, unspecified: Secondary | ICD-10-CM | POA: Diagnosis not present

## 2021-07-23 DIAGNOSIS — R970 Elevated carcinoembryonic antigen [CEA]: Secondary | ICD-10-CM | POA: Diagnosis not present

## 2021-07-23 DIAGNOSIS — K5730 Diverticulosis of large intestine without perforation or abscess without bleeding: Secondary | ICD-10-CM | POA: Diagnosis not present

## 2021-07-23 DIAGNOSIS — I700 Atherosclerosis of aorta: Secondary | ICD-10-CM | POA: Diagnosis not present

## 2021-07-23 DIAGNOSIS — N200 Calculus of kidney: Secondary | ICD-10-CM | POA: Diagnosis not present

## 2021-07-23 MED ORDER — IOHEXOL 300 MG/ML  SOLN
100.0000 mL | Freq: Once | INTRAMUSCULAR | Status: AC | PRN
  Administered 2021-07-23: 100 mL via INTRAVENOUS

## 2021-07-26 ENCOUNTER — Telehealth: Payer: Self-pay

## 2021-07-26 ENCOUNTER — Other Ambulatory Visit: Payer: Self-pay | Admitting: Internal Medicine

## 2021-07-26 DIAGNOSIS — E041 Nontoxic single thyroid nodule: Secondary | ICD-10-CM

## 2021-07-26 NOTE — Progress Notes (Signed)
X °

## 2021-07-26 NOTE — Telephone Encounter (Signed)
Pt called in to get imaging results. I advised the pt that Dr. Alain Marion states that no cancer was show nodules slightly larger than before and that he ordered an ultrasound.    Pt expressed understanding and agreed to ultrasound.

## 2021-07-29 ENCOUNTER — Telehealth: Payer: Self-pay

## 2021-07-29 ENCOUNTER — Encounter: Payer: Self-pay | Admitting: Internal Medicine

## 2021-07-29 NOTE — Telephone Encounter (Signed)
PA for Methadone HCl 10MG  tablets   Key: [id number]   Approved, Coverage Starts on: 07/18/2021 12:00:00 AM, Coverage Ends on: 07/17/2022 12:00:00 AM.

## 2021-07-29 NOTE — Telephone Encounter (Signed)
Patient calling in  Patient wanted to make sure PA has been started for medication  Advised patient it has been started

## 2021-08-04 ENCOUNTER — Other Ambulatory Visit: Payer: Self-pay

## 2021-08-04 ENCOUNTER — Ambulatory Visit
Admission: RE | Admit: 2021-08-04 | Discharge: 2021-08-04 | Disposition: A | Discharge status: Home / Self Care | Payer: Medicare PPO | Source: Ambulatory Visit | Attending: Internal Medicine | Admitting: Internal Medicine

## 2021-08-04 DIAGNOSIS — E041 Nontoxic single thyroid nodule: Secondary | ICD-10-CM

## 2021-08-09 ENCOUNTER — Telehealth: Payer: Self-pay

## 2021-08-09 NOTE — Telephone Encounter (Signed)
Pt called in for his thyroid ultrasound results. I advised pt that Dr. Alain Marion states that it is stable and may need to be repeated in a year.   Pt expressed understanding.

## 2021-09-13 ENCOUNTER — Other Ambulatory Visit: Payer: Self-pay | Admitting: Internal Medicine

## 2021-10-05 ENCOUNTER — Encounter: Payer: Self-pay | Admitting: Internal Medicine

## 2021-10-05 ENCOUNTER — Ambulatory Visit: Payer: Medicare PPO | Admitting: Internal Medicine

## 2021-10-05 ENCOUNTER — Other Ambulatory Visit: Payer: Self-pay

## 2021-10-05 ENCOUNTER — Ambulatory Visit (INDEPENDENT_AMBULATORY_CARE_PROVIDER_SITE_OTHER): Payer: Medicare PPO | Admitting: Internal Medicine

## 2021-10-05 DIAGNOSIS — E041 Nontoxic single thyroid nodule: Secondary | ICD-10-CM | POA: Diagnosis not present

## 2021-10-05 DIAGNOSIS — G8929 Other chronic pain: Secondary | ICD-10-CM | POA: Diagnosis not present

## 2021-10-05 DIAGNOSIS — Z85038 Personal history of other malignant neoplasm of large intestine: Secondary | ICD-10-CM | POA: Diagnosis not present

## 2021-10-05 DIAGNOSIS — M5440 Lumbago with sciatica, unspecified side: Secondary | ICD-10-CM | POA: Diagnosis not present

## 2021-10-05 DIAGNOSIS — E1142 Type 2 diabetes mellitus with diabetic polyneuropathy: Secondary | ICD-10-CM | POA: Diagnosis not present

## 2021-10-05 LAB — COMPREHENSIVE METABOLIC PANEL
ALT: 24 U/L (ref 0–53)
AST: 23 U/L (ref 0–37)
Albumin: 4.6 g/dL (ref 3.5–5.2)
Alkaline Phosphatase: 67 U/L (ref 39–117)
BUN: 12 mg/dL (ref 6–23)
CO2: 33 mEq/L — ABNORMAL HIGH (ref 19–32)
Calcium: 10 mg/dL (ref 8.4–10.5)
Chloride: 100 mEq/L (ref 96–112)
Creatinine, Ser: 0.74 mg/dL (ref 0.40–1.50)
GFR: 86.81 mL/min (ref >60.00)
Glucose, Bld: 111 mg/dL — ABNORMAL HIGH (ref 70–99)
Potassium: 4.5 mEq/L (ref 3.5–5.1)
Sodium: 137 mEq/L (ref 135–145)
Total Bilirubin: 0.5 mg/dL (ref 0.2–1.2)
Total Protein: 7.9 g/dL (ref 6.0–8.3)

## 2021-10-05 LAB — CBC WITH DIFFERENTIAL/PLATELET
Basophils Absolute: 0 10*3/uL (ref 0.0–0.1)
Basophils Relative: 0.5 % (ref 0.0–3.0)
Eosinophils Absolute: 0.1 10*3/uL (ref 0.0–0.7)
Eosinophils Relative: 1.1 % (ref 0.0–5.0)
HCT: 43.9 % (ref 39.0–52.0)
Hemoglobin: 14.8 g/dL (ref 13.0–17.0)
Lymphocytes Relative: 29.1 % (ref 12.0–46.0)
Lymphs Abs: 1.7 10*3/uL (ref 0.7–4.0)
MCHC: 33.8 g/dL (ref 30.0–36.0)
MCV: 97.3 fl (ref 78.0–100.0)
Monocytes Absolute: 0.7 10*3/uL (ref 0.1–1.0)
Monocytes Relative: 11 % (ref 3.0–12.0)
Neutro Abs: 3.5 10*3/uL (ref 1.4–7.7)
Neutrophils Relative %: 58.3 % (ref 43.0–77.0)
Platelets: 249 10*3/uL (ref 150.0–400.0)
RBC: 4.51 Mil/uL (ref 4.22–5.81)
RDW: 12.6 % (ref 11.5–15.5)
WBC: 6 10*3/uL (ref 4.0–10.5)

## 2021-10-05 LAB — HEMOGLOBIN A1C: Hgb A1c MFr Bld: 8.1 % — ABNORMAL HIGH (ref 4.6–6.5)

## 2021-10-05 MED ORDER — METHADONE HCL 10 MG PO TABS: 20.0000 mg | ORAL_TABLET | Freq: Two times a day (BID) | ORAL | 0 refills | Status: DC | PRN

## 2021-10-05 NOTE — Assessment & Plan Note (Signed)
CBGs are better per [name] ?

## 2021-10-05 NOTE — Assessment & Plan Note (Addendum)
Worse. R lobe 2.1 cm on CT and [place]. ?Will get [place] in 12 mo ?

## 2021-10-05 NOTE — Assessment & Plan Note (Signed)
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 ? Potential benefits of a long term Methadone use as well as potential risks (i.e. addiction risk, apnea etc) and complications (i.e. Somnolence, constipation and others) were explained to the patient and were aknowledged. ?3/20 - reducing the dose per Elih's request w/caution ?Diazepam ?

## 2021-10-05 NOTE — Assessment & Plan Note (Addendum)
Chronic ?Monitoring Hgb ?Liver US if needed, AFP ?

## 2021-10-05 NOTE — Assessment & Plan Note (Signed)
Recent elevated CEA and a thyroid nodule. CT was (-) for cancer in the chest/abd/pelvis ?

## 2021-10-05 NOTE — Progress Notes (Signed)
? ?Subjective:  ?Patient ID: Gabriel Erickson, male    DOB: February 14, 1941  Age: 81 y.o. MRN: [id number] ? ?CC: Follow-up (3 month f/u) ? ? ?HPI ?[name] presents for LBP, DM, HTN. F/u on elevated CEA and a thyroid nodule. CT was (-) for cancer in the chest/abd/pelvis ?CBGs are better per [name] ? ?Outpatient Medications Prior to Visit  ?Medication Sig Dispense Refill  ? Alcohol Swabs PADS Use to clean finger before checking blood sugar level BID 200 each 3  ? b complex vitamins tablet Take 1 tablet by mouth daily.    ? Blood Glucose Calibration (TRUE METRIX LEVEL 2) Normal SOLN Use to calibrate meter 1 each 3  ? Blood Glucose Monitoring Suppl (TRUE METRIX METER) w/Device KIT Use to check blood sugar BID 1 kit 0  ? Cholecalciferol 1000 UNITS tablet Take 1,000 Units by mouth daily.    ? diazepam (VALIUM) 5 MG tablet TAKE ONE TABLET TWICE DAILY AS NEEDED FOR muscle spasms, cramps, or insomnia 60 tablet 2  ? famotidine (PEPCID) 40 MG tablet Take 1 tablet (40 mg total) by mouth daily. 90 tablet 3  ? fluticasone (FLONASE) 50 MCG/ACT nasal spray Place 2 sprays into the nose daily. 16 g 11  ? glimepiride (AMARYL) 4 MG tablet Take 1 tablet (4 mg total) by mouth 2 (two) times daily. 180 tablet 1  ? metFORMIN (GLUCOPHAGE) 500 MG tablet Take 1 tablet (500 mg total) by mouth 2 (two) times daily with a meal. 180 tablet 3  ? tadalafil (CIALIS) 20 MG tablet Take 0.5-1 tablets (10-20 mg total) by mouth daily as needed for erectile dysfunction. 30 tablet 3  ? TRUE METRIX BLOOD GLUCOSE TEST test strip TEST BLOOD SUGAR TWICE DAILY 200 strip 3  ? TRUEplus Lancets 30G MISC Use to check blood sugar BID 200 each 5  ? methadone (DOLOPHINE) 10 MG tablet Take 2 tablets (20 mg total) by mouth 2 (two) times daily as needed for severe pain. For pain M54.5 120 tablet 0  ? methadone (DOLOPHINE) 10 MG tablet Take 2 tablets (20 mg total) by mouth 2 (two) times daily as needed for severe pain. 120 tablet 0  ? methadone (DOLOPHINE) 10 MG tablet Take 2  tablets (20 mg total) by mouth 2 (two) times daily as needed for severe pain. 120 tablet 0  ? ?No facility-administered medications prior to visit.  ? ? ?ROS: ?Review of Systems  ?Constitutional:  Negative for appetite change, fatigue and unexpected weight change.  ?HENT:  Negative for congestion, nosebleeds, sneezing, sore throat and trouble swallowing.   ?Eyes:  Negative for itching and visual disturbance.  ?Respiratory:  Negative for cough.   ?Cardiovascular:  Negative for chest pain, palpitations and leg swelling.  ?Gastrointestinal:  Negative for abdominal distention, blood in stool, diarrhea and nausea.  ?Genitourinary:  Negative for frequency and hematuria.  ?Musculoskeletal:  Positive for back pain and gait problem. Negative for joint swelling and neck pain.  ?Skin:  Negative for rash.  ?Neurological:  Negative for dizziness, tremors, speech difficulty and weakness.  ?Psychiatric/Behavioral:  Negative for agitation, dysphoric mood and sleep disturbance. The patient is not nervous/anxious.   ? ?Objective:  ?BP (!) 168/72 (BP Location: Left Arm, Patient Position: Sitting, Cuff Size: Normal)   Pulse 90   Temp 98.4 ?F (36.9 ?C) (Oral)   Ht 6' (1.829 m)   Wt 166 lb 3.2 oz (75.4 kg)   SpO2 97%   BMI 22.54 kg/m?  ? ?BP Readings from Last  3 Encounters:  ?10/05/21 (!) 168/72  ?07/07/21 (!) 148/82  ?04/07/21 (!) 152/78  ? ? ?Wt Readings from Last 3 Encounters:  ?10/05/21 166 lb 3.2 oz (75.4 kg)  ?07/07/21 166 lb 6.4 oz (75.5 kg)  ?04/07/21 166 lb 9.6 oz (75.6 kg)  ? ? ?Physical Exam ?Constitutional:   ?   General: He is not in acute distress. ?   Appearance: He is well-developed.  ?   Comments: NAD  ?Eyes:  ?   Conjunctiva/sclera: Conjunctivae normal.  ?   Pupils: Pupils are equal, round, and reactive to light.  ?Neck:  ?   Thyroid: No thyromegaly.  ?   Vascular: No JVD.  ?Cardiovascular:  ?   Rate and Rhythm: Normal rate and regular rhythm.  ?   Heart sounds: Normal heart sounds. No murmur heard. ?  No friction  rub. No gallop.  ?Pulmonary:  ?   Effort: Pulmonary effort is normal. No respiratory distress.  ?   Breath sounds: Normal breath sounds. No wheezing or rales.  ?Chest:  ?   Chest wall: No tenderness.  ?Abdominal:  ?   General: Bowel sounds are normal. There is no distension.  ?   Palpations: Abdomen is soft. There is no mass.  ?   Tenderness: There is no abdominal tenderness. There is no guarding or rebound.  ?Musculoskeletal:     ?   General: No tenderness. Normal range of motion.  ?   Cervical back: Normal range of motion.  ?Lymphadenopathy:  ?   Cervical: No cervical adenopathy.  ?Skin: ?   General: Skin is warm and dry.  ?   Findings: No rash.  ?Neurological:  ?   Mental Status: He is alert and oriented to person, place, and time.  ?   Cranial Nerves: No cranial nerve deficit.  ?   Motor: No weakness or abnormal muscle tone.  ?   Coordination: Coordination normal.  ?   Gait: Gait abnormal.  ?   Deep Tendon Reflexes: Reflexes are normal and symmetric.  ?Psychiatric:     ?   Behavior: Behavior normal.     ?   Thought Content: Thought content normal.     ?   Judgment: Judgment normal.  ? ? ? A total time of 45 minutes was spent preparing to see the patient, reviewing tests, x-rays, operative reports and other medical records.  Also, obtaining history and performing comprehensive physical exam.  Additionally, counseling the patient regarding the above listed issues abn CT/US, elevated CEA.   Finally, documenting clinical information in the health records, coordination of care, educating the patient re: DM, thyroid nodule. It is a complex case. ? ? ?Lab Results  ?Component Value Date  ? WBC 6.0 10/05/2021  ? HGB 14.8 10/05/2021  ? HCT 43.9 10/05/2021  ? PLT 249.0 10/05/2021  ? GLUCOSE 111 (H) 10/05/2021  ? CHOL 220 (H) 07/01/2019  ? TRIG 188.0 (H) 07/01/2019  ? HDL 37.60 (L) 07/01/2019  ? LDLDIRECT 142.3 04/12/2010  ? LDLCALC 145 (H) 07/01/2019  ? ALT 24 10/05/2021  ? AST 23 10/05/2021  ? NA 137 10/05/2021  ? K 4.5  10/05/2021  ? CL 100 10/05/2021  ? CREATININE 0.74 10/05/2021  ? BUN 12 10/05/2021  ? CO2 33 (H) 10/05/2021  ? TSH 0.46 07/07/2021  ? PSA 2.32 07/07/2021  ? HGBA1C 8.1 (H) 10/05/2021  ? MICROALBUR <0.7 06/19/2017  ? ? ?US THYROID ? ?Result Date: 08/04/2021 ?CLINICAL DATA:  Goiter. EXAM: THYROID ULTRASOUND TECHNIQUE:  Ultrasound examination of the thyroid gland and adjacent soft tissues was performed. COMPARISON:  Prior thyroid ultrasound 08/17/2015 FINDINGS: Parenchymal Echotexture: Moderately heterogenous Isthmus: 0.6 cm Right lobe: 4.4 x 2.2 x 2.5 cm Left lobe: 4.8 x 2.4 x 2.0 cm _________________________________________________________ Estimated total number of nodules >/= 1 cm: 1 Number of spongiform nodules >/=  2 cm not described below (TR1): 0 Number of mixed cystic and solid nodules >/= 1.5 cm not described below (TR2): 0 _________________________________________________________ Nodule # 1: Small cystic and solid nodule in the right upper gland which does not meet criteria to warrant further evaluation. _________________________________________________________ Nodule # 2: Location: Right; Mid Maximum size: 2.2 cm; Other 2 dimensions: 2.0 x 1.7 cm Composition: solid/almost completely solid (2) Echogenicity: isoechoic (1) Shape: not taller-than-wide (0) Margins: ill-defined (0) Echogenic foci: none (0) ACR TI-RADS total points: 0. ACR TI-RADS risk category: TR3 (3 points). ACR TI-RADS recommendations: *Given size (>/= 1.5 - 2.4 cm) and appearance, a follow-up ultrasound in 1 year should be considered based on TI-RADS criteria. _________________________________________________________ Comparison to the prior study is very challenging. Nodule # 2 above has likely enlarged compared to measurements of 1.4 x 1.6 x 1.2 cm previously. IMPRESSION: 1. Slowly enlarging right mid thyroid nodule meets criteria for continued imaging observation. Recommend follow-up ultrasound in 1 year. The above is in keeping with the ACR  TI-RADS recommendations - J Am Coll Radiol 2017;14:587-595. Electronically Signed   By: Jacqulynn Cadet M.D.   On: 08/04/2021 15:57  ? ? ?Assessment & Plan:  ? ?Problem List Items Addressed This Visit   ? ? Dia

## 2021-10-06 LAB — CEA: CEA: 2.6 ng/mL — ABNORMAL HIGH

## 2021-10-06 LAB — IRON,TIBC AND FERRITIN PANEL
%SAT: 49 % (calc) — ABNORMAL HIGH (ref 20–48)
Ferritin: 358 ng/mL (ref 24–380)
Iron: 158 ug/dL (ref 50–180)
TIBC: 322 mcg/dL (calc) (ref 250–425)

## 2021-10-08 ENCOUNTER — Telehealth: Payer: Self-pay | Admitting: Internal Medicine

## 2021-10-08 NOTE — Telephone Encounter (Signed)
Correct.  The order is for the next year.  Thanks ?

## 2021-10-08 NOTE — Telephone Encounter (Signed)
Patient calling in ? ?Patient wanting to make sure that the [place] order that provider placed does not need to be taken until next year ? ?Please confirm & fu [phone] ?

## 2021-10-11 NOTE — Telephone Encounter (Signed)
Spoke with pt ad was able to inform him that his [place] orders are for next year. ?

## 2021-10-26 ENCOUNTER — Other Ambulatory Visit: Payer: Self-pay

## 2021-10-26 MED ORDER — GLIMEPIRIDE 4 MG PO TABS: 4.0000 mg | ORAL_TABLET | Freq: Two times a day (BID) | ORAL | 1 refills | Status: DC

## 2022-01-05 ENCOUNTER — Ambulatory Visit: Payer: Medicare PPO | Admitting: Internal Medicine

## 2022-01-05 ENCOUNTER — Encounter: Payer: Self-pay | Admitting: Internal Medicine

## 2022-01-05 DIAGNOSIS — E1142 Type 2 diabetes mellitus with diabetic polyneuropathy: Secondary | ICD-10-CM

## 2022-01-05 DIAGNOSIS — R5383 Other fatigue: Secondary | ICD-10-CM

## 2022-01-05 DIAGNOSIS — G8929 Other chronic pain: Secondary | ICD-10-CM

## 2022-01-05 DIAGNOSIS — J301 Allergic rhinitis due to pollen: Secondary | ICD-10-CM

## 2022-01-05 DIAGNOSIS — J309 Allergic rhinitis, unspecified: Secondary | ICD-10-CM | POA: Insufficient documentation

## 2022-01-05 DIAGNOSIS — M5440 Lumbago with sciatica, unspecified side: Secondary | ICD-10-CM | POA: Diagnosis not present

## 2022-01-05 LAB — COMPREHENSIVE METABOLIC PANEL
ALT: 20 U/L (ref 0–53)
AST: 23 U/L (ref 0–37)
Albumin: 4.4 g/dL (ref 3.5–5.2)
Alkaline Phosphatase: 66 U/L (ref 39–117)
BUN: 11 mg/dL (ref 6–23)
CO2: 32 mEq/L (ref 19–32)
Calcium: 9.7 mg/dL (ref 8.4–10.5)
Chloride: 102 mEq/L (ref 96–112)
Creatinine, Ser: 0.83 mg/dL (ref 0.40–1.50)
GFR: 83.7 mL/min (ref >60.00)
Glucose, Bld: 72 mg/dL (ref 70–99)
Potassium: 4.5 mEq/L (ref 3.5–5.1)
Sodium: 139 mEq/L (ref 135–145)
Total Bilirubin: 0.5 mg/dL (ref 0.2–1.2)
Total Protein: 7.9 g/dL (ref 6.0–8.3)

## 2022-01-05 LAB — HEMOGLOBIN A1C: Hgb A1c MFr Bld: 7.4 % — ABNORMAL HIGH (ref 4.6–6.5)

## 2022-01-05 MED ORDER — LORATADINE 10 MG PO TABS: 10.0000 mg | ORAL_TABLET | Freq: Every day | ORAL | 3 refills | Status: AC

## 2022-01-05 MED ORDER — METHYLPREDNISOLONE ACETATE 80 MG/ML IJ SUSP
80.0000 mg | Freq: Once | INTRAMUSCULAR | Status: AC
  Administered 2022-01-05: 80 mg via INTRAMUSCULAR

## 2022-01-05 MED ORDER — METHADONE HCL 10 MG PO TABS: 20.0000 mg | ORAL_TABLET | Freq: Two times a day (BID) | ORAL | 0 refills | Status: DC | PRN

## 2022-01-05 MED ORDER — DIAZEPAM 5 MG PO TABS: ORAL_TABLET | ORAL | 2 refills | Status: DC

## 2022-01-05 MED ORDER — ALCOHOL SWABS PADS: MEDICATED_PAD | 3 refills | Status: DC

## 2022-01-05 NOTE — Assessment & Plan Note (Signed)
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 addiction risk, apnea etc) and complications (i.e. Somnolence, constipation and others) were explained to the patient and were aknowledged. 3/20 - reducing the dose per Hassel's request w/caution Diazepam Benzo use risk is discussed - taking very little Narcane option discussed Cont on Glimepiride, Metformin

## 2022-01-05 NOTE — Addendum Note (Signed)
Addended by: Marijean Heath R on: 01/05/2022 02:49 PM   Modules accepted: Orders

## 2022-01-05 NOTE — Progress Notes (Signed)
Subjective:  Patient ID: Gabriel Erickson, male    DOB: 1941/05/21  Age: 81 y.o. MRN: [id number]  CC: No chief complaint on file.   HPI [name] presents for DM, HTN, LBP CBGs 69-100 lately  Outpatient Medications Prior to Visit  Medication Sig Dispense Refill   b complex vitamins tablet Take 1 tablet by mouth daily.     Blood Glucose Calibration (TRUE METRIX LEVEL 2) Normal SOLN Use to calibrate meter 1 each 3   Blood Glucose Monitoring Suppl (TRUE METRIX METER) w/Device KIT Use to check blood sugar BID 1 kit 0   Cholecalciferol 1000 UNITS tablet Take 1,000 Units by mouth daily.     famotidine (PEPCID) 40 MG tablet Take 1 tablet (40 mg total) by mouth daily. 90 tablet 3   fluticasone (FLONASE) 50 MCG/ACT nasal spray Place 2 sprays into the nose daily. 16 g 11   glimepiride (AMARYL) 4 MG tablet Take 1 tablet (4 mg total) by mouth 2 (two) times daily. 180 tablet 1   metFORMIN (GLUCOPHAGE) 500 MG tablet Take 1 tablet (500 mg total) by mouth 2 (two) times daily with a meal. 180 tablet 3   tadalafil (CIALIS) 20 MG tablet Take 0.5-1 tablets (10-20 mg total) by mouth daily as needed for erectile dysfunction. 30 tablet 3   TRUE METRIX BLOOD GLUCOSE TEST test strip TEST BLOOD SUGAR TWICE DAILY 200 strip 3   TRUEplus Lancets 30G MISC Use to check blood sugar BID 200 each 5   Alcohol Swabs PADS Use to clean finger before checking blood sugar level BID 200 each 3   diazepam (VALIUM) 5 MG tablet TAKE ONE TABLET TWICE DAILY AS NEEDED FOR muscle spasms, cramps, or insomnia 60 tablet 2   methadone (DOLOPHINE) 10 MG tablet Take 2 tablets (20 mg total) by mouth 2 (two) times daily as needed for severe pain. For pain M54.5 120 tablet 0   methadone (DOLOPHINE) 10 MG tablet Take 2 tablets (20 mg total) by mouth 2 (two) times daily as needed for severe pain. 120 tablet 0   methadone (DOLOPHINE) 10 MG tablet Take 2 tablets (20 mg total) by mouth 2 (two) times daily as needed for severe pain. 120 tablet 0   No  facility-administered medications prior to visit.    ROS: Review of Systems  Constitutional:  Negative for appetite change, fatigue and unexpected weight change.  HENT:  Negative for congestion, nosebleeds, sneezing, sore throat and trouble swallowing.   Eyes:  Negative for itching and visual disturbance.  Respiratory:  Negative for cough.   Cardiovascular:  Negative for chest pain, palpitations and leg swelling.  Gastrointestinal:  Negative for abdominal distention, blood in stool, diarrhea and nausea.  Genitourinary:  Negative for frequency and hematuria.  Musculoskeletal:  Positive for back pain and gait problem. Negative for joint swelling and neck pain.  Skin:  Negative for rash.  Neurological:  Negative for dizziness, tremors, speech difficulty and weakness.  Psychiatric/Behavioral:  Negative for agitation, dysphoric mood and sleep disturbance. The patient is not nervous/anxious.     Objective:  BP 130/70 (BP Location: Left Arm, Patient Position: Sitting, Cuff Size: Normal)   Pulse 88   Temp 98.2 F (36.8 C) (Oral)   Ht 6' (1.829 m)   Wt 159 lb (72.1 kg)   SpO2 97%   BMI 21.56 kg/m   BP Readings from Last 3 Encounters:  01/05/22 130/70  10/05/21 (!) 168/72  07/07/21 (!) 148/82    Wt Readings from  Last 3 Encounters:  01/05/22 159 lb (72.1 kg)  10/05/21 166 lb 3.2 oz (75.4 kg)  07/07/21 166 lb 6.4 oz (75.5 kg)    Physical Exam Constitutional:      General: He is not in acute distress.    Appearance: Normal appearance. He is well-developed.     Comments: NAD  Eyes:     Conjunctiva/sclera: Conjunctivae normal.     Pupils: Pupils are equal, round, and reactive to light.  Neck:     Thyroid: No thyromegaly.     Vascular: No JVD.  Cardiovascular:     Rate and Rhythm: Normal rate and regular rhythm.     Heart sounds: Normal heart sounds. No murmur heard.    No friction rub. No gallop.  Pulmonary:     Effort: Pulmonary effort is normal. No respiratory distress.      Breath sounds: Normal breath sounds. No wheezing or rales.  Chest:     Chest wall: No tenderness.  Abdominal:     General: Bowel sounds are normal. There is no distension.     Palpations: Abdomen is soft. There is no mass.     Tenderness: There is no abdominal tenderness. There is no guarding or rebound.  Musculoskeletal:        General: Tenderness present. Normal range of motion.     Cervical back: Normal range of motion.  Lymphadenopathy:     Cervical: No cervical adenopathy.  Skin:    General: Skin is warm and dry.     Findings: No rash.  Neurological:     Mental Status: He is alert and oriented to person, place, and time.     Cranial Nerves: No cranial nerve deficit.     Motor: Weakness present. No abnormal muscle tone.     Coordination: Coordination normal.     Gait: Gait abnormal.     Deep Tendon Reflexes: Reflexes are normal and symmetric.  Psychiatric:        Behavior: Behavior normal.        Thought Content: Thought content normal.        Judgment: Judgment normal.   LEs weak Antalgic gait    Lab Results  Component Value Date   WBC 6.0 10/05/2021   HGB 14.8 10/05/2021   HCT 43.9 10/05/2021   PLT 249.0 10/05/2021   GLUCOSE 111 (H) 10/05/2021   CHOL 220 (H) 07/01/2019   TRIG 188.0 (H) 07/01/2019   HDL 37.60 (L) 07/01/2019   LDLDIRECT 142.3 04/12/2010   LDLCALC 145 (H) 07/01/2019   ALT 24 10/05/2021   AST 23 10/05/2021   NA 137 10/05/2021   K 4.5 10/05/2021   CL 100 10/05/2021   CREATININE 0.74 10/05/2021   BUN 12 10/05/2021   CO2 33 (H) 10/05/2021   TSH 0.46 07/07/2021   PSA 2.32 07/07/2021   HGBA1C 8.1 (H) 10/05/2021   MICROALBUR <0.7 06/19/2017    US THYROID  Result Date: 08/04/2021 CLINICAL DATA:  Goiter. EXAM: THYROID ULTRASOUND TECHNIQUE: Ultrasound examination of the thyroid gland and adjacent soft tissues was performed. COMPARISON:  Prior thyroid ultrasound 08/17/2015 FINDINGS: Parenchymal Echotexture: Moderately heterogenous Isthmus: 0.6 cm  Right lobe: 4.4 x 2.2 x 2.5 cm Left lobe: 4.8 x 2.4 x 2.0 cm _________________________________________________________ Estimated total number of nodules >/= 1 cm: 1 Number of spongiform nodules >/=  2 cm not described below (TR1): 0 Number of mixed cystic and solid nodules >/= 1.5 cm not described below (TR2): 0 _________________________________________________________ Nodule # 1: Small cystic  and solid nodule in the right upper gland which does not meet criteria to warrant further evaluation. _________________________________________________________ Nodule # 2: Location: Right; Mid Maximum size: 2.2 cm; Other 2 dimensions: 2.0 x 1.7 cm Composition: solid/almost completely solid (2) Echogenicity: isoechoic (1) Shape: not taller-than-wide (0) Margins: ill-defined (0) Echogenic foci: none (0) ACR TI-RADS total points: 0. ACR TI-RADS risk category: TR3 (3 points). ACR TI-RADS recommendations: *Given size (>/= 1.5 - 2.4 cm) and appearance, a follow-up ultrasound in 1 year should be considered based on TI-RADS criteria. _________________________________________________________ Comparison to the prior study is very challenging. Nodule # 2 above has likely enlarged compared to measurements of 1.4 x 1.6 x 1.2 cm previously. IMPRESSION: 1. Slowly enlarging right mid thyroid nodule meets criteria for continued imaging observation. Recommend follow-up ultrasound in 1 year. The above is in keeping with the ACR TI-RADS recommendations - J Am Coll Radiol 2017;14:587-595. Electronically Signed   By: Jacqulynn Cadet M.D.   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 addiction risk, apnea etc) and complications (i.e. Somnolence,  constipation and others) were explained to the patient and were aknowledged. 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 addiction risk, apnea etc) and complications (i.e. Somnolence, constipation and others) were explained to the patient and were aknowledged. 3/20 - reducing the dose per Darral's request w/caution Diazepam Benzo use risk is discussed - taking very little Narcane option discussed      Relevant Medications   methadone (DOLOPHINE) 10 MG tablet   methadone (DOLOPHINE) 10 MG tablet   methadone (DOLOPHINE) 10 MG tablet      Meds ordered this encounter  Medications   Alcohol Swabs PADS    Sig: Use to clean finger before checking blood sugar level BID    Dispense:  200 each    Refill:  3    Dx E11.9   methadone (DOLOPHINE) 10 MG tablet    Sig: Take 2 tablets (20 mg total) by mouth 2 (two) times daily as needed for severe pain. For pain M54.5    Dispense:  120 tablet    Refill:  0    Please fill on or after 03/17/22   methadone (DOLOPHINE) 10 MG tablet    Sig: Take 2 tablets (20 mg total) by mouth 2 (two) times daily as needed for severe pain.    Dispense:  120 tablet    Refill:  0    Please fill on or after 02/15/22   diazepam (VALIUM) 5 MG tablet    Sig: TAKE ONE TABLET TWICE DAILY AS NEEDED FOR muscle spasms, cramps, or insomnia    Dispense:  60 tablet    Refill:  2  methadone (DOLOPHINE) 10 MG tablet    Sig: Take 2 tablets (20 mg total) by mouth 2 (two) times daily as needed for severe pain.    Dispense:  120 tablet    Refill:  0    Please fill on or after 01/16/22   loratadine (CLARITIN) 10 MG  tablet    Sig: Take 1 tablet (10 mg total) by mouth daily.    Dispense:  90 tablet    Refill:  3      Follow-up: Return in about 3 months (around 04/07/2022) for a follow-up visit.  Walker Kehr, MD

## 2022-01-05 NOTE — Assessment & Plan Note (Signed)
Worse Depo-medrol 80 mg IM Claritin 10 mg

## 2022-01-05 NOTE — Assessment & Plan Note (Signed)
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 addiction risk, apnea etc) and complications (i.e. Somnolence, constipation and others) were explained to the patient and were aknowledged. 3/20 - reducing the dose per Ayron's request w/caution Diazepam Benzo use risk is discussed - taking very little Narcane option discussed

## 2022-01-05 NOTE — Assessment & Plan Note (Signed)
D/c benadryl

## 2022-01-11 ENCOUNTER — Telehealth: Payer: Self-pay | Admitting: Internal Medicine

## 2022-01-13 MED ORDER — RABEPRAZOLE SODIUM 20 MG PO TBEC: 20.0000 mg | DELAYED_RELEASE_TABLET | Freq: Every day | ORAL | 11 refills | Status: DC

## 2022-01-13 NOTE — Telephone Encounter (Signed)
Okay.  Thanks.

## 2022-01-14 ENCOUNTER — Other Ambulatory Visit: Payer: Self-pay

## 2022-01-14 MED ORDER — RABEPRAZOLE SODIUM 20 MG PO TBEC: 20.0000 mg | DELAYED_RELEASE_TABLET | Freq: Every day | ORAL | 11 refills | Status: DC

## 2022-01-14 NOTE — Telephone Encounter (Signed)
Pt stated would like Acifix 20 called in,went to pharmacy wasn't there.    [facility] [place], [place] - [address] AT [address] Phone:  [phone]  Fax:  [phone]      Phone:  [phone]  Fax:  [phone]

## 2022-02-25 ENCOUNTER — Ambulatory Visit (INDEPENDENT_AMBULATORY_CARE_PROVIDER_SITE_OTHER): Payer: Medicare PPO

## 2022-02-25 DIAGNOSIS — Z0000 Encounter for general adult medical examination without abnormal findings: Secondary | ICD-10-CM

## 2022-02-25 NOTE — Patient Instructions (Signed)
Gabriel Erickson , Thank you for taking time to come for your Medicare Wellness Visit. I appreciate your ongoing commitment to your health goals. Please review the following plan we discussed and let me know if I can assist you in the future.   Screening recommendations/referrals: Colonoscopy: no longer required  Recommended yearly ophthalmology/optometry visit for glaucoma screening and checkup Recommended yearly dental visit for hygiene and checkup  Vaccinations: Influenza vaccine: completed  Pneumococcal vaccine: completed  Tdap vaccine: due  Shingles vaccine: will consider     Advanced directives: yes   Conditions/risks identified: none   Next appointment: none   Preventive Care 25 Years and Older, Male Preventive care refers to lifestyle choices and visits with your health care provider that can promote health and wellness. What does preventive care include? A yearly physical exam. This is also called an annual well check. Dental exams once or twice a year. Routine eye exams. Ask your health care provider how often you should have your eyes checked. Personal lifestyle choices, including: Daily care of your teeth and gums. Regular physical activity. Eating a healthy diet. Avoiding tobacco and drug use. Limiting alcohol use. Practicing safe sex. Taking low doses of aspirin every day. Taking vitamin and mineral supplements as recommended by your health care provider. What happens during an annual well check? The services and screenings done by your health care provider during your annual well check will depend on your age, overall health, lifestyle risk factors, and family history of disease. Counseling  Your health care provider may ask you questions about your: Alcohol use. Tobacco use. Drug use. Emotional well-being. Home and relationship well-being. Sexual activity. Eating habits. History of falls. Memory and ability to understand (cognition). Work and work  Statistician. Screening  You may have the following tests or measurements: Height, weight, and BMI. Blood pressure. Lipid and cholesterol levels. These may be checked every 5 years, or more frequently if you are over 87 years old. Skin check. Lung cancer screening. You may have this screening every year starting at age 64 if you have a 30-pack-year history of smoking and currently smoke or have quit within the past 15 years. Fecal occult blood test (FOBT) of the stool. You may have this test every year starting at age 73. Flexible sigmoidoscopy or colonoscopy. You may have a sigmoidoscopy every 5 years or a colonoscopy every 10 years starting at age 44. Prostate cancer screening. Recommendations will vary depending on your family history and other risks. Hepatitis C blood test. Hepatitis B blood test. Sexually transmitted disease (STD) testing. Diabetes screening. This is done by checking your blood sugar (glucose) after you have not eaten for a while (fasting). You may have this done every 1-3 years. Abdominal aortic aneurysm (AAA) screening. You may need this if you are a current or former smoker. Osteoporosis. You may be screened starting at age 75 if you are at high risk. Talk with your health care provider about your test results, treatment options, and if necessary, the need for more tests. Vaccines  Your health care provider may recommend certain vaccines, such as: Influenza vaccine. This is recommended every year. Tetanus, diphtheria, and acellular pertussis (Tdap, Td) vaccine. You may need a Td booster every 10 years. Zoster vaccine. You may need this after age 41. Pneumococcal 13-valent conjugate (PCV13) vaccine. One dose is recommended after age 85. Pneumococcal polysaccharide (PPSV23) vaccine. One dose is recommended after age 8. Talk to your health care provider about which screenings and vaccines you need  and how often you need them. This information is not intended to replace  advice given to you by your health care provider. Make sure you discuss any questions you have with your health care provider. Document Released: 07/31/2015 Document Revised: 03/23/2016 Document Reviewed: 05/05/2015 Elsevier Interactive Patient Education  2017 [place] Prevention in the Home Falls can cause injuries. They can happen to people of all ages. There are many things you can do to make your home safe and to help prevent falls. What can I do on the outside of my home? Regularly fix the edges of walkways and driveways and fix any cracks. Remove anything that might make you trip as you walk through a door, such as a raised step or threshold. Trim any bushes or trees on the path to your home. Use bright outdoor lighting. Clear any walking paths of anything that might make someone trip, such as rocks or tools. Regularly check to see if handrails are loose or broken. Make sure that both sides of any steps have handrails. Any raised decks and porches should have guardrails on the edges. Have any leaves, snow, or ice cleared regularly. Use sand or salt on walking paths during winter. Clean up any spills in your garage right away. This includes oil or grease spills. What can I do in the bathroom? Use night lights. Install grab bars by the toilet and in the tub and shower. Do not use towel bars as grab bars. Use non-skid mats or decals in the tub or shower. If you need to sit down in the shower, use a plastic, non-slip stool. Keep the floor dry. Clean up any water that spills on the floor as soon as it happens. Remove soap buildup in the tub or shower regularly. Attach bath mats securely with double-sided non-slip rug tape. Do not have throw rugs and other things on the floor that can make you trip. What can I do in the bedroom? Use night lights. Make sure that you have a light by your bed that is easy to reach. Do not use any sheets or blankets that are too big for your bed.  They should not hang down onto the floor. Have a firm chair that has side arms. You can use this for support while you get dressed. Do not have throw rugs and other things on the floor that can make you trip. What can I do in the kitchen? Clean up any spills right away. Avoid walking on wet floors. Keep items that you use a lot in easy-to-reach places. If you need to reach something above you, use a strong step stool that has a grab bar. Keep electrical cords out of the [place]. Do not use floor polish or wax that makes floors slippery. If you must use wax, use non-skid floor wax. Do not have throw rugs and other things on the floor that can make you trip. What can I do with my stairs? Do not leave any items on the stairs. Make sure that there are handrails on both sides of the stairs and use them. Fix handrails that are broken or loose. Make sure that handrails are as long as the stairways. Check any carpeting to make sure that it is firmly attached to the stairs. Fix any carpet that is loose or worn. Avoid having throw rugs at the top or bottom of the stairs. If you do have throw rugs, attach them to the floor with carpet tape. Make sure that you  have a light switch at the top of the stairs and the bottom of the stairs. If you do not have them, ask someone to add them for you. What else can I do to help prevent falls? Wear shoes that: Do not have high heels. Have rubber bottoms. Are comfortable and fit you well. Are closed at the toe. Do not wear sandals. If you use a stepladder: Make sure that it is fully opened. Do not climb a closed stepladder. Make sure that both sides of the stepladder are locked into place. Ask someone to hold it for you, if possible. Clearly mark and make sure that you can see: Any grab bars or handrails. First and last steps. Where the edge of each step is. Use tools that help you move around (mobility aids) if they are needed. These  include: Canes. Walkers. Scooters. Crutches. Turn on the lights when you go into a dark area. Replace any light bulbs as soon as they burn out. Set up your furniture so you have a clear path. Avoid moving your furniture around. If any of your floors are uneven, fix them. If there are any pets around you, be aware of where they are. Review your medicines with your doctor. Some medicines can make you feel dizzy. This can increase your chance of falling. Ask your doctor what other things that you can do to help prevent falls. This information is not intended to replace advice given to you by your health care provider. Make sure you discuss any questions you have with your health care provider. Document Released: 04/30/2009 Document Revised: 12/10/2015 Document Reviewed: 08/08/2014 Elsevier Interactive Patient Education  2017 [employer].

## 2022-02-25 NOTE — Progress Notes (Cosign Needed Addendum)
Subjective:   Gabriel Erickson is a 81 y.o. male who presents for an Subsequent Medicare Annual Wellness Visit.   I connected with Gabriel Erickson today by telephone and verified that I am speaking with the correct person using two identifiers. Location patient: home Location provider: work Persons participating in the virtual visit: patient, provider.   I discussed the limitations, risks, security and privacy concerns of performing an evaluation and management service by telephone and the availability of in person appointments. I also discussed with the patient that there may be a patient responsible charge related to this service. The patient expressed understanding and verbally consented to this telephonic visit.    Interactive audio and video telecommunications were attempted between this provider and patient, however failed, due to patient having technical difficulties OR patient did not have access to video capability.  We continued and completed visit with audio only.    Review of Systems     Cardiac Risk Factors include: advanced age (>70men, >39 women);diabetes mellitus;male gender     Objective:    Today's Vitals   There is no height or weight on file to calculate BMI.     02/25/2022    3:00 PM 05/28/2018   10:45 AM 11/23/2017   10:43 AM 07/26/2017    8:00 AM 07/14/2017   12:59 PM 11/21/2016   11:52 AM 05/23/2016   11:36 AM  Advanced Directives  Does Patient Have a Medical Advance Directive? Yes Yes Yes Yes;No Yes Yes Yes  Type of Estate agent of [place];Living will Healthcare Power of [place];Living will Healthcare Power of [place];Living will  Healthcare Power of [employer] Power of [employer] Power of Attorney  Does patient want to make changes to medical advance directive?  No - Patient declined       Copy of Healthcare Power of Attorney in Chart?  No - copy requested No - copy requested   No - copy requested No - copy requested     Current Medications (verified) Outpatient Encounter Medications as of 02/25/2022  Medication Sig   Alcohol Swabs PADS Use to clean finger before checking blood sugar level BID   b complex vitamins tablet Take 1 tablet by mouth daily.   Blood Glucose Calibration (TRUE METRIX LEVEL 2) Normal SOLN Use to calibrate meter   Blood Glucose Monitoring Suppl (TRUE METRIX METER) w/Device KIT Use to check blood sugar BID   Cholecalciferol 1000 UNITS tablet Take 1,000 Units by mouth daily.   diazepam (VALIUM) 5 MG tablet TAKE ONE TABLET TWICE DAILY AS NEEDED FOR muscle spasms, cramps, or insomnia   famotidine (PEPCID) 40 MG tablet Take 1 tablet (40 mg total) by mouth daily.   fluticasone (FLONASE) 50 MCG/ACT nasal spray Place 2 sprays into the nose daily.   glimepiride (AMARYL) 4 MG tablet Take 1 tablet (4 mg total) by mouth 2 (two) times daily.   loratadine (CLARITIN) 10 MG tablet Take 1 tablet (10 mg total) by mouth daily.   metFORMIN (GLUCOPHAGE) 500 MG tablet Take 1 tablet (500 mg total) by mouth 2 (two) times daily with a meal.   methadone (DOLOPHINE) 10 MG tablet Take 2 tablets (20 mg total) by mouth 2 (two) times daily as needed for severe pain. For pain M54.5   methadone (DOLOPHINE) 10 MG tablet Take 2 tablets (20 mg total) by mouth 2 (two) times daily as needed for severe pain.   methadone (DOLOPHINE) 10 MG tablet Take 2 tablets (20 mg total) by  mouth 2 (two) times daily as needed for severe pain.   RABEprazole (ACIPHEX) 20 MG tablet Take 1 tablet (20 mg total) by mouth daily.   tadalafil (CIALIS) 20 MG tablet Take 0.5-1 tablets (10-20 mg total) by mouth daily as needed for erectile dysfunction.   TRUE METRIX BLOOD GLUCOSE TEST test strip TEST BLOOD SUGAR TWICE DAILY   TRUEplus Lancets 30G MISC Use to check blood sugar BID   No facility-administered encounter medications on file as of 02/25/2022.    Allergies (verified) Penicillins, Ciprofloxacin, Metformin and related, Rosuvastatin, Actos  [pioglitazone], Jardiance [empagliflozin], and Influenza vaccines   History: Past Medical History:  Diagnosis Date   Allergy    Cancer ([place])    COLON CANCER   Cataract    surgery - left eye only   Colon cancer, ascending ([place]) 06/24/2015   Constipation    Diabetes mellitus    Diverticulosis of colon    Fatty liver    Gallstones    Possible vs sludge   GERD (gastroesophageal reflux disease)    Hypogonadism male    Hypotension    LBP (low back pain)    Osteoarthritis    lower back   Peripheral neuropathy    occasional   Seasonal allergies    Urinary frequency    Past Surgical History:  Procedure Laterality Date   APPENDECTOMY     CATARACT EXTRACTION Left    COLON SURGERY     COLONOSCOPY  04/2015   [facility]   EYE SURGERY Left    FOOT SURGERY  1966   Left   HEMORRHOID SURGERY  2011   [facility]   LAPAROSCOPIC PARTIAL COLECTOMY N/A 06/24/2015   Procedure: LAPAROSCOPIC PARTIAL COLECTOMY;  Surgeon: Erroll Luna, MD;  Location: [facility];  Service: General;  Laterality: N/A;   LUMBAR LAMINECTOMY     PARTIAL COLECTOMY  06/24/2015   POLYPECTOMY     TONSILLECTOMY     Family History  Problem Relation Age of Onset   Heart disease Mother    Cancer Sister        Liver, biliary duct & tube   Hemochromatosis Sister    Stroke Sister    Colon polyps Sister    Colon cancer Paternal Uncle    Esophageal cancer Neg Hx    Stomach cancer Neg Hx    Rectal cancer Neg Hx    Social History   Socioeconomic History   Marital status: Married    Spouse name: Not on file   Number of children: Not on file   Years of education: Not on file   Highest education level: Not on file  Occupational History   Occupation: Retired    Fish farm manager: RETIRED  Tobacco Use   Smoking status: Never   Smokeless tobacco: Never  Vaping Use   Vaping Use: Never used  Substance and Sexual Activity   Alcohol use: Yes    Alcohol/week: 0.0 standard drinks of alcohol    Comment: rarely wine   Drug use: No    Sexual activity: Yes  Other Topics Concern   Not on file  Social History Narrative   Family history diabetes 1st degree relative   Family history hypertension   Family history of colon cancer: uncle         Caring for ill mother at home and a sister w/brain injury at a NH      Married, wife Santiago Glad for 20+ years   Retired as Surveyor, 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  Enjoys outdoors-fishing   Has no biological children, but # 3 step children   Has a cat, that "rules the home"-indoor cat (has claws)         Social Determinants of Health   Financial Resource Strain: Low Risk  (02/25/2022)   Overall Financial Resource Strain (CARDIA)    Difficulty of Paying Living Expenses: Not hard at all  Food Insecurity: No Food Insecurity (02/25/2022)   Hunger Vital Sign    Worried About Running Out of Food in the Last Year: Never true    Ran Out of Food in the Last Year: Never true  Transportation Needs: No Transportation Needs (02/25/2022)   PRAPARE - Hydrologist (Medical): No    Lack of Transportation (Non-Medical): No  Physical Activity: Sufficiently Active (02/25/2022)   Exercise Vital Sign    Days of Exercise per Week: 7 days    Minutes of Exercise per Session: 50 min  Stress: No Stress Concern Present (02/25/2022)   [place]    Feeling of Stress : Not at all  Social Connections: [place] (02/25/2022)   Social Connection and Isolation Panel [NHANES]    Frequency of Communication with Friends and Family: Three times a week    Frequency of Social Gatherings with Friends and Family: Three times a week    Attends Religious Services: More than 4 times per year    Active Member of Clubs or Organizations: Yes    Attends Music therapist: More than 4 times per year    Marital Status: Married    Tobacco Counseling Counseling given: Not Answered   Clinical  Intake:  Pre-visit preparation completed: Yes  Pain : No/denies pain     Nutritional Risks: None Diabetes: Yes CBG done?: No Did pt. bring in CBG monitor from home?: No  How often do you need to have someone help you when you read instructions, pamphlets, or other written materials from your doctor or pharmacy?: 1 - Never What is the last grade level you completed in school?: college  Diabetic?yes  Nutrition Risk Assessment:  Has the patient had any N/V/D within the last 2 months?  No  Does the patient have any non-healing wounds?  No  Has the patient had any unintentional weight loss or weight gain?  No   Diabetes:  Is the patient diabetic?  Yes  If diabetic, was a CBG obtained today?  No  Did the patient bring in their glucometer from home?  No  How often do you monitor your CBG's? 2 x day .   Financial Strains and Diabetes Management:  Are you having any financial strains with the device, your supplies or your medication? No .  Does the patient want to be seen by Chronic Care Management for management of their diabetes?  No  Would the patient like to be referred to a Nutritionist or for Diabetic Management?  No   Diabetic Exams:  Diabetic Eye Exam: Completed 05/2021 Diabetic Foot Exam: Overdue, Pt has been advised about the importance in completing this exam. Pt is scheduled for diabetic foot exam on next office visit .   Interpreter Needed?: No  Information entered by :: L.Wilson,LPN   Activities of Daily Living    02/25/2022    2:59 PM  In your present state of health, do you have any difficulty performing the following activities:  Hearing? 0  Vision? 0  Difficulty concentrating or making decisions?  0  Walking or climbing stairs? 0  Dressing or bathing? 0  Doing errands, shopping? 0  Preparing Food and eating ? N  Using the Toilet? N  In the past six months, have you accidently leaked urine? N  Do you have problems with loss of bowel control? N   Managing your Medications? N  Managing your Finances? N  Housekeeping or managing your Housekeeping? N    Patient Care Team: Plotnikov, Evie Lacks, MD as PCP - Rosana Hoes, MD as Consulting Physician (General Surgery) Armbruster, Carlota Raspberry, MD as Consulting Physician (Gastroenterology)  Indicate any recent Medical Services you may have received from other than [facility] providers in the past year (date may be approximate).     Assessment:   This is a routine wellness examination for [place].  Hearing/Vision screen Vision Screening - Comments:: Annual eye exams wear glasses   Dietary issues and exercise activities discussed: Current Exercise Habits: Home exercise routine, Type of exercise: walking, Time (Minutes): 45, Frequency (Times/Week): 7, Weekly Exercise (Minutes/Week): 315, Intensity: Mild, Exercise limited by: orthopedic condition(s)   Goals Addressed   None    Depression Screen    02/25/2022    2:57 PM 02/25/2022    2:56 PM 10/05/2021    1:40 PM 10/06/2020   11:43 AM 03/31/2020   11:05 AM 06/19/2017    1:38 PM 06/13/2016    1:32 PM  PHQ 2/9 Scores  PHQ - 2 Score 0 0 0 0 0 0 0    Fall Risk    02/25/2022    3:01 PM 10/05/2021    1:40 PM 10/06/2020   11:43 AM 03/31/2020   11:05 AM 06/25/2018    1:30 PM  [name] in the past year? 0 0 0 0 0  Number falls in past yr: 0 0 0 0   Injury with Fall? 0 0 0 0   Risk for fall due to :   No Fall Risks    Follow up Falls evaluation completed;Education provided    Falls evaluation completed    [name]:  Any stairs in or around the home? Yes  If so, are there any without handrails? No  Home free of loose throw rugs in walkways, pet beds, electrical cords, etc? Yes  Adequate lighting in your home to reduce risk of falls? Yes   ASSISTIVE DEVICES UTILIZED TO PREVENT FALLS:  Life alert? No  Use of a cane, walker or w/c? No  Grab bars in the bathroom? No  Shower chair or  bench in shower? No  Elevated toilet seat or a handicapped toilet? No     Cognitive Function:        02/25/2022    2:59 PM  6CIT Screen  What Year? 0 points  What month? 0 points  What time? 0 points  Count back from 20 0 points  Months in reverse 0 points  Repeat phrase 0 points  Total Score 0 points  Normal cognitive status assessed by telephone conversation by this Nurse Health Advisor. No abnormalities found.    Immunizations Immunization History  Administered Date(s) Administered   PFIZER(Purple Top)SARS-COV-2 Vaccination 09/10/2019, 06/26/2020   PNEUMOCOCCAL CONJUGATE-20 01/05/2021   Pneumococcal Conjugate-13 08/26/2014   Pneumococcal Polysaccharide-23 04/12/2010   Td 05/26/2014    TDAP status: Due, Education has been provided regarding the importance of this vaccine. Advised may receive this vaccine at local pharmacy or Health Dept. Aware to provide a copy of  the vaccination record if obtained from local pharmacy or Health Dept. Verbalized acceptance and understanding.  Flu Vaccine status: Up to date  Pneumococcal vaccine status: Up to date  Covid-19 vaccine status: Completed vaccines  Qualifies for Shingles Vaccine? Yes   Zostavax completed No   Shingrix Completed?: No.    Education has been provided regarding the importance of this vaccine. Patient has been advised to call insurance company to determine out of pocket expense if they have not yet received this vaccine. Advised may also receive vaccine at local pharmacy or Health Dept. Verbalized acceptance and understanding.  Screening Tests Health Maintenance  Topic Date Due   Hepatitis C Screening  Never done   Zoster Vaccines- Shingrix (1 of 2) Never done   FOOT EXAM  05/08/2012   Diabetic kidney evaluation - Urine ACR  06/19/2018   COLONOSCOPY (Pts 45-83yrs Insurance coverage will need to be confirmed)  07/26/2020   OPHTHALMOLOGY EXAM  06/04/2022   HEMOGLOBIN A1C  07/07/2022   Diabetic kidney  evaluation - GFR measurement  01/06/2023   TETANUS/TDAP  05/26/2024   Pneumonia Vaccine 59+ Years old  Completed   HPV VACCINES  Aged Out   COVID-19 Vaccine  Discontinued    Health Maintenance  Health Maintenance Due  Topic Date Due   Hepatitis C Screening  Never done   Zoster Vaccines- Shingrix (1 of 2) Never done   FOOT EXAM  05/08/2012   Diabetic kidney evaluation - Urine ACR  06/19/2018   COLONOSCOPY (Pts 45-25yrs Insurance coverage will need to be confirmed)  07/26/2020    Colorectal cancer screening: No longer required.   Lung Cancer Screening: (Low Dose CT Chest recommended if Age 39-80 years, 30 pack-year currently smoking OR have quit w/in 15years.) does not qualify.   Lung Cancer Screening Referral: n/a  Additional Screening:  Hepatitis C Screening: does not qualify;   Vision Screening: Recommended annual ophthalmology exams for early detection of glaucoma and other disorders of the eye. Is the patient up to date with their annual eye exam?  Yes  Who is the provider or what is the name of the office in which the patient attends annual eye exams? Dr.Snipes  If pt is not established with a provider, would they like to be referred to a provider to establish care? No .   Dental Screening: Recommended annual dental exams for proper oral hygiene  Community Resource Referral / Chronic Care Management: CRR required this visit?  No   CCM required this visit?  No      Plan:     I have personally reviewed and noted the following in the patient's chart:   Medical and social history Use of alcohol, tobacco or illicit drugs  Current medications and supplements including opioid prescriptions. Patient is not currently taking opioid prescriptions. Functional ability and status Nutritional status Physical activity Advanced directives List of other physicians Hospitalizations, surgeries, and ER visits in previous 12 months Vitals Screenings to include cognitive,  depression, and falls Referrals and appointments  In addition, I have reviewed and discussed with patient certain preventive protocols, quality metrics, and best practice recommendations. A written personalized care plan for preventive services as well as general preventive health recommendations were provided to patient.     Daphane Shepherd, LPN   [id number]   Nurse Notes: none     Medical screening examination/treatment/procedure(s) were performed by non-physician practitioner and as supervising physician I was immediately available for consultation/collaboration.  I agree with above. Aleksei Plotnikov,  MD

## 2022-04-04 ENCOUNTER — Telehealth: Payer: Self-pay | Admitting: Internal Medicine

## 2022-04-04 NOTE — Telephone Encounter (Signed)
Would it be apporpriate for Gabriel Erickson to have Narcan - if so you will need to send a rx to [facility] for this script.

## 2022-04-05 MED ORDER — NALOXONE HCL 4 MG/0.1ML NA LIQD: NASAL | 2 refills | Status: AC

## 2022-04-05 NOTE — Telephone Encounter (Signed)
Rx sent to [facility] pharmacy.Marland KitchenJohny Chess

## 2022-04-05 NOTE — Telephone Encounter (Signed)
Yes.  I think he was prescribed to keep Narcan in the past.  Thanks

## 2022-04-07 ENCOUNTER — Encounter: Payer: Self-pay | Admitting: Internal Medicine

## 2022-04-07 ENCOUNTER — Ambulatory Visit: Payer: Medicare PPO | Admitting: Internal Medicine

## 2022-04-07 DIAGNOSIS — M5440 Lumbago with sciatica, unspecified side: Secondary | ICD-10-CM | POA: Diagnosis not present

## 2022-04-07 DIAGNOSIS — G8929 Other chronic pain: Secondary | ICD-10-CM

## 2022-04-07 DIAGNOSIS — R030 Elevated blood-pressure reading, without diagnosis of hypertension: Secondary | ICD-10-CM

## 2022-04-07 DIAGNOSIS — J301 Allergic rhinitis due to pollen: Secondary | ICD-10-CM | POA: Diagnosis not present

## 2022-04-07 DIAGNOSIS — E1142 Type 2 diabetes mellitus with diabetic polyneuropathy: Secondary | ICD-10-CM | POA: Diagnosis not present

## 2022-04-07 LAB — COMPREHENSIVE METABOLIC PANEL
ALT: 21 U/L (ref 0–53)
AST: 23 U/L (ref 0–37)
Albumin: 4.2 g/dL (ref 3.5–5.2)
Alkaline Phosphatase: 82 U/L (ref 39–117)
BUN: 15 mg/dL (ref 6–23)
CO2: 32 mEq/L (ref 19–32)
Calcium: 9.8 mg/dL (ref 8.4–10.5)
Chloride: 100 mEq/L (ref 96–112)
Creatinine, Ser: 0.74 mg/dL (ref 0.40–1.50)
GFR: 86.5 mL/min (ref >60.00)
Glucose, Bld: 85 mg/dL (ref 70–99)
Potassium: 4.2 mEq/L (ref 3.5–5.1)
Sodium: 138 mEq/L (ref 135–145)
Total Bilirubin: 0.5 mg/dL (ref 0.2–1.2)
Total Protein: 7.7 g/dL (ref 6.0–8.3)

## 2022-04-07 LAB — CBC WITH DIFFERENTIAL/PLATELET
Basophils Absolute: 0.1 10*3/uL (ref 0.0–0.1)
Basophils Relative: 0.7 % (ref 0.0–3.0)
Eosinophils Absolute: 0.1 10*3/uL (ref 0.0–0.7)
Eosinophils Relative: 1.1 % (ref 0.0–5.0)
HCT: 41.4 % (ref 39.0–52.0)
Hemoglobin: 14.4 g/dL (ref 13.0–17.0)
Lymphocytes Relative: 28.6 % (ref 12.0–46.0)
Lymphs Abs: 2.2 10*3/uL (ref 0.7–4.0)
MCHC: 34.7 g/dL (ref 30.0–36.0)
MCV: 97.3 fl (ref 78.0–100.0)
Monocytes Absolute: 0.7 10*3/uL (ref 0.1–1.0)
Monocytes Relative: 9.5 % (ref 3.0–12.0)
Neutro Abs: 4.7 10*3/uL (ref 1.4–7.7)
Neutrophils Relative %: 60.1 % (ref 43.0–77.0)
Platelets: 234 10*3/uL (ref 150.0–400.0)
RBC: 4.25 Mil/uL (ref 4.22–5.81)
RDW: 12.7 % (ref 11.5–15.5)
WBC: 7.8 10*3/uL (ref 4.0–10.5)

## 2022-04-07 MED ORDER — METFORMIN HCL 500 MG PO TABS: 500.0000 mg | ORAL_TABLET | Freq: Two times a day (BID) | ORAL | 3 refills | Status: DC

## 2022-04-07 MED ORDER — ALCOHOL SWABS PADS: MEDICATED_PAD | 3 refills | Status: DC

## 2022-04-07 MED ORDER — TRUEPLUS LANCETS 30G MISC: 5 refills | Status: DC

## 2022-04-07 MED ORDER — METHADONE HCL 10 MG PO TABS: 20.0000 mg | ORAL_TABLET | Freq: Two times a day (BID) | ORAL | 0 refills | Status: DC | PRN

## 2022-04-07 MED ORDER — GLIMEPIRIDE 4 MG PO TABS: 4.0000 mg | ORAL_TABLET | Freq: Two times a day (BID) | ORAL | 3 refills | Status: DC

## 2022-04-07 MED ORDER — TRUE METRIX BLOOD GLUCOSE TEST VI STRP: ORAL_STRIP | 3 refills | Status: DC

## 2022-04-07 MED ORDER — DIAZEPAM 5 MG PO TABS: ORAL_TABLET | ORAL | 2 refills | Status: DC

## 2022-04-07 NOTE — Assessment & Plan Note (Signed)
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 addiction risk, apnea etc) and complications (i.e. Somnolence, constipation and others) were explained to the patient and were aknowledged. 3/20 - reducing the dose per Gabriel Erickson's request w/caution Diazepam Benzo use risk is discussed - taking very little Narcane option discussed

## 2022-04-07 NOTE — Assessment & Plan Note (Signed)
  Nl BP at home 

## 2022-04-07 NOTE — Progress Notes (Signed)
Subjective:  Patient ID: Gabriel Erickson, male    DOB: 05/17/1941  Age: 81 y.o. MRN: [id number]  CC: Follow-up (3 month f/u)   HPI Ami [place]sberry Paparella presents for LBP, DM, elevated BP/Nl BP at home, allergies  Outpatient Medications Prior to Visit  Medication Sig Dispense Refill   b complex vitamins tablet Take 1 tablet by mouth daily.     Blood Glucose Calibration (TRUE METRIX LEVEL 2) Normal SOLN Use to calibrate meter 1 each 3   Blood Glucose Monitoring Suppl (TRUE METRIX METER) w/Device KIT Use to check blood sugar BID 1 kit 0   Cholecalciferol 1000 UNITS tablet Take 1,000 Units by mouth daily.     famotidine (PEPCID) 40 MG tablet Take 1 tablet (40 mg total) by mouth daily. 90 tablet 3   fluticasone (FLONASE) 50 MCG/ACT nasal spray Place 2 sprays into the nose daily. 16 g 11   loratadine (CLARITIN) 10 MG tablet Take 1 tablet (10 mg total) by mouth daily. 90 tablet 3   naloxone (NARCAN) nasal spray 4 mg/0.1 mL 1 actuation in one nostril once. May repeat in 2-3 min 1 each 2   RABEprazole (ACIPHEX) 20 MG tablet Take 1 tablet (20 mg total) by mouth daily. 30 tablet 11   tadalafil (CIALIS) 20 MG tablet Take 0.5-1 tablets (10-20 mg total) by mouth daily as needed for erectile dysfunction. 30 tablet 3   Alcohol Swabs PADS Use to clean finger before checking blood sugar level BID 200 each 3   diazepam (VALIUM) 5 MG tablet TAKE ONE TABLET TWICE DAILY AS NEEDED FOR muscle spasms, cramps, or insomnia 60 tablet 2   glimepiride (AMARYL) 4 MG tablet Take 1 tablet (4 mg total) by mouth 2 (two) times daily. 180 tablet 1   metFORMIN (GLUCOPHAGE) 500 MG tablet Take 1 tablet (500 mg total) by mouth 2 (two) times daily with a meal. 180 tablet 3   methadone (DOLOPHINE) 10 MG tablet Take 2 tablets (20 mg total) by mouth 2 (two) times daily as needed for severe pain. For pain M54.5 120 tablet 0   methadone (DOLOPHINE) 10 MG tablet Take 2 tablets (20 mg total) by mouth 2 (two) times daily as needed for severe pain. 120  tablet 0   methadone (DOLOPHINE) 10 MG tablet Take 2 tablets (20 mg total) by mouth 2 (two) times daily as needed for severe pain. 120 tablet 0   TRUE METRIX BLOOD GLUCOSE TEST test strip TEST BLOOD SUGAR TWICE DAILY 200 strip 3   TRUEplus Lancets 30G MISC Use to check blood sugar BID 200 each 5   No facility-administered medications prior to visit.    ROS: Review of Systems  Constitutional:  Negative for appetite change, fatigue and unexpected weight change.  HENT:  Negative for congestion, nosebleeds, sneezing, sore throat and trouble swallowing.   Eyes:  Negative for itching and visual disturbance.  Respiratory:  Negative for cough.   Cardiovascular:  Negative for chest pain, palpitations and leg swelling.  Gastrointestinal:  Negative for abdominal distention, blood in stool, diarrhea and nausea.  Genitourinary:  Negative for frequency and hematuria.  Musculoskeletal:  Positive for back pain and gait problem. Negative for joint swelling and neck pain.  Skin:  Negative for rash and wound.  Neurological:  Negative for dizziness, tremors, speech difficulty and weakness.  Psychiatric/Behavioral:  Negative for agitation, dysphoric mood and sleep disturbance. The patient is not nervous/anxious.     Objective:  BP (!) 162/70 (BP Location: Left Arm)  Pulse 79   Temp 98.3 F (36.8 C) (Oral)   Ht 6' (1.829 m)   Wt 156 lb 6.4 oz (70.9 kg)   SpO2 97%   BMI 21.21 kg/m   BP Readings from Last 3 Encounters:  04/07/22 (!) 162/70  01/05/22 130/70  10/05/21 (!) 168/72    Wt Readings from Last 3 Encounters:  04/07/22 156 lb 6.4 oz (70.9 kg)  01/05/22 159 lb (72.1 kg)  10/05/21 166 lb 3.2 oz (75.4 kg)    Physical Exam Constitutional:      General: He is not in acute distress.    Appearance: Normal appearance. He is well-developed.     Comments: NAD  Eyes:     Conjunctiva/sclera: Conjunctivae normal.     Pupils: Pupils are equal, round, and reactive to light.  Neck:     Thyroid:  No thyromegaly.     Vascular: No JVD.  Cardiovascular:     Rate and Rhythm: Normal rate and regular rhythm.     Heart sounds: Normal heart sounds. No murmur heard.    No friction rub. No gallop.  Pulmonary:     Effort: Pulmonary effort is normal. No respiratory distress.     Breath sounds: Normal breath sounds. No wheezing or rales.  Chest:     Chest wall: No tenderness.  Abdominal:     General: Bowel sounds are normal. There is no distension.     Palpations: Abdomen is soft. There is no mass.     Tenderness: There is no abdominal tenderness. There is no guarding or rebound.  Musculoskeletal:        General: Tenderness present. Normal range of motion.     Cervical back: Normal range of motion.  Lymphadenopathy:     Cervical: No cervical adenopathy.  Skin:    General: Skin is warm and dry.     Findings: No rash.  Neurological:     Mental Status: He is alert and oriented to person, place, and time.     Cranial Nerves: No cranial nerve deficit.     Motor: Weakness present. No abnormal muscle tone.     Coordination: Coordination normal.     Gait: Gait abnormal.     Deep Tendon Reflexes: Reflexes are normal and symmetric.  Psychiatric:        Behavior: Behavior normal.        Thought Content: Thought content normal.        Judgment: Judgment normal.   LS w/pain Weak legs  Lab Results  Component Value Date   WBC 6.0 10/05/2021   HGB 14.8 10/05/2021   HCT 43.9 10/05/2021   PLT 249.0 10/05/2021   GLUCOSE 72 01/05/2022   CHOL 220 (H) 07/01/2019   TRIG 188.0 (H) 07/01/2019   HDL 37.60 (L) 07/01/2019   LDLDIRECT 142.3 04/12/2010   LDLCALC 145 (H) 07/01/2019   ALT 20 01/05/2022   AST 23 01/05/2022   NA 139 01/05/2022   K 4.5 01/05/2022   CL 102 01/05/2022   CREATININE 0.83 01/05/2022   BUN 11 01/05/2022   CO2 32 01/05/2022   TSH 0.46 07/07/2021   PSA 2.32 07/07/2021   HGBA1C 7.4 (H) 01/05/2022   MICROALBUR <0.7 06/19/2017    US THYROID  Result Date:  08/04/2021 CLINICAL DATA:  Goiter. EXAM: THYROID ULTRASOUND TECHNIQUE: Ultrasound examination of the thyroid gland and adjacent soft tissues was performed. COMPARISON:  Prior thyroid ultrasound 08/17/2015 FINDINGS: Parenchymal Echotexture: Moderately heterogenous Isthmus: 0.6 cm Right lobe: 4.4 x 2.2  x 2.5 cm Left lobe: 4.8 x 2.4 x 2.0 cm _________________________________________________________ Estimated total number of nodules >/= 1 cm: 1 Number of spongiform nodules >/=  2 cm not described below (TR1): 0 Number of mixed cystic and solid nodules >/= 1.5 cm not described below (TR2): 0 _________________________________________________________ Nodule # 1: Small cystic and solid nodule in the right upper gland which does not meet criteria to warrant further evaluation. _________________________________________________________ Nodule # 2: Location: Right; Mid Maximum size: 2.2 cm; Other 2 dimensions: 2.0 x 1.7 cm Composition: solid/almost completely solid (2) Echogenicity: isoechoic (1) Shape: not taller-than-wide (0) Margins: ill-defined (0) Echogenic foci: none (0) ACR TI-RADS total points: 0. ACR TI-RADS risk category: TR3 (3 points). ACR TI-RADS recommendations: *Given size (>/= 1.5 - 2.4 cm) and appearance, a follow-up ultrasound in 1 year should be considered based on TI-RADS criteria. _________________________________________________________ Comparison to the prior study is very challenging. Nodule # 2 above has likely enlarged compared to measurements of 1.4 x 1.6 x 1.2 cm previously. IMPRESSION: 1. Slowly enlarging right mid thyroid nodule meets criteria for continued imaging observation. Recommend follow-up ultrasound in 1 year. The above is in keeping with the ACR TI-RADS recommendations - J Am Coll Radiol 2017;14:587-595. Electronically Signed   By: Jacqulynn Cadet M.D.   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 addiction risk, apnea etc) and complications (i.e. Somnolence, constipation and others) were explained to the patient and were aknowledged. 3/20 - reducing the dose per Peggy's request w/caution Diazepam Benzo use risk is discussed - taking very little Narcane option discussed      Relevant Medications   methadone (DOLOPHINE) 10 MG tablet   methadone (DOLOPHINE) 10 MG tablet   methadone (DOLOPHINE) 10 MG tablet   Other Relevant Orders   CBC with Differential/Platelet      Meds ordered this encounter  Medications   metFORMIN (GLUCOPHAGE) 500 MG tablet    Sig: Take 1 tablet (500 mg total) by mouth 2 (two) times daily with a meal.    Dispense:  180 tablet    Refill:  3   glimepiride (AMARYL) 4 MG tablet    Sig: Take 1 tablet (4 mg total) by mouth 2 (two) times daily.    Dispense:  180 tablet    Refill:  3   metFORMIN (GLUCOPHAGE) 500 MG tablet    Sig: Take 1 tablet (500 mg total) by mouth 2 (two) times daily with a meal.    Dispense:  180 tablet    Refill:  3   glimepiride (AMARYL) 4 MG tablet    Sig: Take 1 tablet (4 mg total) by mouth  2 (two) times daily.    Dispense:  180 tablet    Refill:  3   methadone (DOLOPHINE) 10 MG tablet    Sig: Take 2 tablets (20 mg total) by mouth 2 (two) times daily as needed for severe pain. For pain M54.5    Dispense:  120 tablet     Refill:  0    Please fill on or after 06/06/22   methadone (DOLOPHINE) 10 MG tablet    Sig: Take 2 tablets (20 mg total) by mouth 2 (two) times daily as needed for severe pain.    Dispense:  120 tablet    Refill:  0    Please fill on or after 05/07/22   methadone (DOLOPHINE) 10 MG tablet    Sig: Take 2 tablets (20 mg total) by mouth 2 (two) times daily as needed for severe pain.    Dispense:  120 tablet    Refill:  0    Please fill on or after 04/07/22   diazepam (VALIUM) 5 MG tablet    Sig: TAKE ONE TABLET TWICE DAILY AS NEEDED FOR muscle spasms, cramps, or insomnia    Dispense:  60 tablet    Refill:  2   Alcohol Swabs PADS    Sig: Use to clean finger before checking blood sugar level BID    Dispense:  200 each    Refill:  3    Dx E11.9   glucose blood (TRUE METRIX BLOOD GLUCOSE TEST) test strip    Sig: TEST BLOOD SUGAR TWICE DAILY    Dispense:  200 strip    Refill:  3   TRUEplus Lancets 30G MISC    Sig: Use to check blood sugar BID    Dispense:  200 each    Refill:  5    DX E11.9      Follow-up: Return in about 3 months (around 07/07/2022) for a follow-up visit.  Walker Kehr, MD

## 2022-04-07 NOTE — Addendum Note (Signed)
Addended by: Susy Manor on: 2/56/3893 02:18 PM   Modules accepted: Orders

## 2022-04-07 NOTE — Assessment & Plan Note (Signed)
Cont on Claritin 10 mg

## 2022-04-07 NOTE — Assessment & Plan Note (Addendum)
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

## 2022-04-08 LAB — HEMOGLOBIN A1C
Hgb A1c MFr Bld: 7.5 % of total Hgb — ABNORMAL HIGH (ref <5.7)
Mean Plasma Glucose: 169 mg/dL
eAG (mmol/L): 9.3 mmol/L

## 2022-07-07 ENCOUNTER — Encounter: Payer: Self-pay | Admitting: Internal Medicine

## 2022-07-07 ENCOUNTER — Ambulatory Visit: Payer: Medicare PPO | Admitting: Internal Medicine

## 2022-07-07 ENCOUNTER — Telehealth: Payer: Self-pay | Admitting: *Deleted

## 2022-07-07 VITALS — BP 136/82 | HR 95 | Temp 98.5°F | Ht 72.0 in | Wt 159.0 lb

## 2022-07-07 DIAGNOSIS — R5383 Other fatigue: Secondary | ICD-10-CM | POA: Diagnosis not present

## 2022-07-07 DIAGNOSIS — E1142 Type 2 diabetes mellitus with diabetic polyneuropathy: Secondary | ICD-10-CM

## 2022-07-07 DIAGNOSIS — G8929 Other chronic pain: Secondary | ICD-10-CM | POA: Diagnosis not present

## 2022-07-07 DIAGNOSIS — Z85038 Personal history of other malignant neoplasm of large intestine: Secondary | ICD-10-CM | POA: Diagnosis not present

## 2022-07-07 DIAGNOSIS — M5440 Lumbago with sciatica, unspecified side: Secondary | ICD-10-CM

## 2022-07-07 LAB — CBC WITH DIFFERENTIAL/PLATELET
Basophils Absolute: 0 10*3/uL (ref 0.0–0.1)
Basophils Relative: 0.6 % (ref 0.0–3.0)
Eosinophils Absolute: 0.1 10*3/uL (ref 0.0–0.7)
Eosinophils Relative: 0.9 % (ref 0.0–5.0)
HCT: 43.3 % (ref 39.0–52.0)
Hemoglobin: 14.8 g/dL (ref 13.0–17.0)
Lymphocytes Relative: 32.5 % (ref 12.0–46.0)
Lymphs Abs: 2.2 10*3/uL (ref 0.7–4.0)
MCHC: 34.3 g/dL (ref 30.0–36.0)
MCV: 95.8 fl (ref 78.0–100.0)
Monocytes Absolute: 0.7 10*3/uL (ref 0.1–1.0)
Monocytes Relative: 9.9 % (ref 3.0–12.0)
Neutro Abs: 3.9 10*3/uL (ref 1.4–7.7)
Neutrophils Relative %: 56.1 % (ref 43.0–77.0)
Platelets: 246 10*3/uL (ref 150.0–400.0)
RBC: 4.52 Mil/uL (ref 4.22–5.81)
RDW: 12.2 % (ref 11.5–15.5)
WBC: 6.9 10*3/uL (ref 4.0–10.5)

## 2022-07-07 LAB — URINALYSIS
Bilirubin Urine: NEGATIVE
Hgb urine dipstick: NEGATIVE
Ketones, ur: NEGATIVE
Leukocytes,Ua: NEGATIVE
Nitrite: NEGATIVE
Specific Gravity, Urine: 1.025 (ref 1.000–1.030)
Total Protein, Urine: NEGATIVE
Urine Glucose: NEGATIVE
Urobilinogen, UA: 0.2 (ref 0.0–1.0)
pH: 5.5 (ref 5.0–8.0)

## 2022-07-07 LAB — LIPID PANEL
Cholesterol: 190 mg/dL (ref 0–200)
HDL: 38.8 mg/dL — ABNORMAL LOW (ref >39.00)
LDL Cholesterol: 121 mg/dL — ABNORMAL HIGH (ref 0–99)
NonHDL: 150.93
Total CHOL/HDL Ratio: 5
Triglycerides: 149 mg/dL (ref 0.0–149.0)
VLDL: 29.8 mg/dL (ref 0.0–40.0)

## 2022-07-07 LAB — COMPREHENSIVE METABOLIC PANEL
ALT: 19 U/L (ref 0–53)
AST: 20 U/L (ref 0–37)
Albumin: 4.4 g/dL (ref 3.5–5.2)
Alkaline Phosphatase: 80 U/L (ref 39–117)
BUN: 17 mg/dL (ref 6–23)
CO2: 33 mEq/L — ABNORMAL HIGH (ref 19–32)
Calcium: 9.9 mg/dL (ref 8.4–10.5)
Chloride: 100 mEq/L (ref 96–112)
Creatinine, Ser: 0.77 mg/dL (ref 0.40–1.50)
GFR: 85.32 mL/min (ref >60.00)
Glucose, Bld: 142 mg/dL — ABNORMAL HIGH (ref 70–99)
Potassium: 4.6 mEq/L (ref 3.5–5.1)
Sodium: 138 mEq/L (ref 135–145)
Total Bilirubin: 0.5 mg/dL (ref 0.2–1.2)
Total Protein: 7.6 g/dL (ref 6.0–8.3)

## 2022-07-07 LAB — TSH: TSH: 1.03 u[IU]/mL (ref 0.35–5.50)

## 2022-07-07 LAB — HEMOGLOBIN A1C: Hgb A1c MFr Bld: 8.7 % — ABNORMAL HIGH (ref 4.6–6.5)

## 2022-07-07 MED ORDER — METHADONE HCL 10 MG PO TABS: 20.0000 mg | ORAL_TABLET | Freq: Two times a day (BID) | ORAL | 0 refills | Status: DC | PRN

## 2022-07-07 MED ORDER — LANTUS SOLOSTAR 100 UNIT/ML ~~LOC~~ SOPN: 10.0000 [IU] | PEN_INJECTOR | Freq: Every day | SUBCUTANEOUS | 11 refills | Status: DC

## 2022-07-07 MED ORDER — GLIMEPIRIDE 4 MG PO TABS: 4.0000 mg | ORAL_TABLET | Freq: Two times a day (BID) | ORAL | 3 refills | Status: DC

## 2022-07-07 NOTE — Telephone Encounter (Signed)
Per MD pt should take 10 units once a day. Called [facility] spoke w/ Leona Carry gave her MD request../lmb

## 2022-07-07 NOTE — Assessment & Plan Note (Signed)
Uncontrolled Cont Amaryl Start Lantus inj D/c Metformin

## 2022-07-07 NOTE — Progress Notes (Signed)
Subjective:  Patient ID: Gabriel Erickson, male    DOB: Nov 19, 1940  Age: 81 y.o. MRN: [id number]  CC: Follow-up (Concerns about blood sugar, medications is not controlling it only happens in the winter time )   HPI [place] presents for DM, HTN, LBP CBGs are worse in winter  Outpatient Medications Prior to Visit  Medication Sig Dispense Refill   Alcohol Swabs PADS Use to clean finger before checking blood sugar level BID 200 each 3   b complex vitamins tablet Take 1 tablet by mouth daily.     Blood Glucose Calibration (TRUE METRIX LEVEL 2) Normal SOLN Use to calibrate meter 1 each 3   Blood Glucose Monitoring Suppl (TRUE METRIX METER) w/Device KIT Use to check blood sugar BID 1 kit 0   Cholecalciferol 1000 UNITS tablet Take 1,000 Units by mouth daily.     diazepam (VALIUM) 5 MG tablet TAKE ONE TABLET TWICE DAILY AS NEEDED FOR muscle spasms, cramps, or insomnia 60 tablet 2   famotidine (PEPCID) 40 MG tablet Take 1 tablet (40 mg total) by mouth daily. 90 tablet 3   fluticasone (FLONASE) 50 MCG/ACT nasal spray Place 2 sprays into the nose daily. 16 g 11   glimepiride (AMARYL) 4 MG tablet Take 1 tablet (4 mg total) by mouth 2 (two) times daily. 180 tablet 3   glucose blood (TRUE METRIX BLOOD GLUCOSE TEST) test strip TEST BLOOD SUGAR TWICE DAILY 200 strip 3   loratadine (CLARITIN) 10 MG tablet Take 1 tablet (10 mg total) by mouth daily. 90 tablet 3   naloxone (NARCAN) nasal spray 4 mg/0.1 mL 1 actuation in one nostril once. May repeat in 2-3 min 1 each 2   RABEprazole (ACIPHEX) 20 MG tablet Take 1 tablet (20 mg total) by mouth daily. 30 tablet 11   tadalafil (CIALIS) 20 MG tablet Take 0.5-1 tablets (10-20 mg total) by mouth daily as needed for erectile dysfunction. 30 tablet 3   TRUEplus Lancets 30G MISC Use to check blood sugar BID 200 each 5   glimepiride (AMARYL) 4 MG tablet Take 1 tablet (4 mg total) by mouth 2 (two) times daily. 180 tablet 3   metFORMIN (GLUCOPHAGE) 500 MG tablet Take 1  tablet (500 mg total) by mouth 2 (two) times daily with a meal. 180 tablet 3   metFORMIN (GLUCOPHAGE) 500 MG tablet Take 1 tablet (500 mg total) by mouth 2 (two) times daily with a meal. 180 tablet 3   methadone (DOLOPHINE) 10 MG tablet Take 2 tablets (20 mg total) by mouth 2 (two) times daily as needed for severe pain. For pain M54.5 120 tablet 0   methadone (DOLOPHINE) 10 MG tablet Take 2 tablets (20 mg total) by mouth 2 (two) times daily as needed for severe pain. 120 tablet 0   methadone (DOLOPHINE) 10 MG tablet Take 2 tablets (20 mg total) by mouth 2 (two) times daily as needed for severe pain. 120 tablet 0   No facility-administered medications prior to visit.    ROS: Review of Systems  Constitutional:  Negative for appetite change, fatigue and unexpected weight change.  HENT:  Negative for congestion, nosebleeds, sneezing, sore throat and trouble swallowing.   Eyes:  Negative for itching and visual disturbance.  Respiratory:  Negative for cough.   Cardiovascular:  Negative for chest pain, palpitations and leg swelling.  Gastrointestinal:  Negative for abdominal distention, blood in stool, diarrhea and nausea.  Genitourinary:  Negative for frequency and hematuria.  Musculoskeletal:  Positive for back pain and gait problem. Negative for joint swelling and neck pain.  Skin:  Negative for rash.  Neurological:  Negative for dizziness, tremors, speech difficulty and weakness.  Psychiatric/Behavioral:  Negative for agitation, decreased concentration, dysphoric mood, sleep disturbance and suicidal ideas. The patient is not nervous/anxious.     Objective:  BP 136/82 (BP Location: Left Arm, Patient Position: Sitting, Cuff Size: Normal)   Pulse 95   Temp 98.5 F (36.9 C) (Oral)   Ht 6' (1.829 m)   Wt 159 lb (72.1 kg)   SpO2 98%   BMI 21.56 kg/m   BP Readings from Last 3 Encounters:  07/07/22 136/82  04/07/22 (!) 162/70  01/05/22 130/70    Wt Readings from Last 3 Encounters:   07/07/22 159 lb (72.1 kg)  04/07/22 156 lb 6.4 oz (70.9 kg)  01/05/22 159 lb (72.1 kg)    Physical Exam Constitutional:      General: He is not in acute distress.    Appearance: He is well-developed.     Comments: NAD  Eyes:     Conjunctiva/sclera: Conjunctivae normal.     Pupils: Pupils are equal, round, and reactive to light.  Neck:     Thyroid: No thyromegaly.     Vascular: No JVD.  Cardiovascular:     Rate and Rhythm: Normal rate and regular rhythm.     Heart sounds: Normal heart sounds. No murmur heard.    No friction rub. No gallop.  Pulmonary:     Effort: Pulmonary effort is normal. No respiratory distress.     Breath sounds: Normal breath sounds. No wheezing or rales.  Chest:     Chest wall: No tenderness.  Abdominal:     General: Bowel sounds are normal. There is no distension.     Palpations: Abdomen is soft. There is no mass.     Tenderness: There is no abdominal tenderness. There is no guarding or rebound.  Musculoskeletal:        General: No tenderness. Normal range of motion.     Cervical back: Normal range of motion.  Lymphadenopathy:     Cervical: No cervical adenopathy.  Skin:    General: Skin is warm and dry.     Findings: No rash.  Neurological:     Mental Status: He is alert and oriented to person, place, and time.     Cranial Nerves: No cranial nerve deficit.     Motor: No abnormal muscle tone.     Coordination: Coordination normal.     Gait: Gait normal.     Deep Tendon Reflexes: Reflexes are normal and symmetric.  Psychiatric:        Behavior: Behavior normal.        Thought Content: Thought content normal.        Judgment: Judgment normal.     Lab Results  Component Value Date   WBC 7.8 04/07/2022   HGB 14.4 04/07/2022   HCT 41.4 04/07/2022   PLT 234.0 04/07/2022   GLUCOSE 85 04/07/2022   CHOL 220 (H) 07/01/2019   TRIG 188.0 (H) 07/01/2019   HDL 37.60 (L) 07/01/2019   LDLDIRECT 142.3 04/12/2010   LDLCALC 145 (H) 07/01/2019   ALT  21 04/07/2022   AST 23 04/07/2022   NA 138 04/07/2022   K 4.2 04/07/2022   CL 100 04/07/2022   CREATININE 0.74 04/07/2022   BUN 15 04/07/2022   CO2 32 04/07/2022   TSH 0.46 07/07/2021   PSA 2.32 07/07/2021  HGBA1C 7.5 (H) 04/07/2022   MICROALBUR <0.7 06/19/2017    US THYROID  Result Date: 08/04/2021 CLINICAL DATA:  Goiter. EXAM: THYROID ULTRASOUND TECHNIQUE: Ultrasound examination of the thyroid gland and adjacent soft tissues was performed. COMPARISON:  Prior thyroid ultrasound 08/17/2015 FINDINGS: Parenchymal Echotexture: Moderately heterogenous Isthmus: 0.6 cm Right lobe: 4.4 x 2.2 x 2.5 cm Left lobe: 4.8 x 2.4 x 2.0 cm _________________________________________________________ Estimated total number of nodules >/= 1 cm: 1 Number of spongiform nodules >/=  2 cm not described below (TR1): 0 Number of mixed cystic and solid nodules >/= 1.5 cm not described below (TR2): 0 _________________________________________________________ Nodule # 1: Small cystic and solid nodule in the right upper gland which does not meet criteria to warrant further evaluation. _________________________________________________________ Nodule # 2: Location: Right; Mid Maximum size: 2.2 cm; Other 2 dimensions: 2.0 x 1.7 cm Composition: solid/almost completely solid (2) Echogenicity: isoechoic (1) Shape: not taller-than-wide (0) Margins: ill-defined (0) Echogenic foci: none (0) ACR TI-RADS total points: 0. ACR TI-RADS risk category: TR3 (3 points). ACR TI-RADS recommendations: *Given size (>/= 1.5 - 2.4 cm) and appearance, a follow-up ultrasound in 1 year should be considered based on TI-RADS criteria. _________________________________________________________ Comparison to the prior study is very challenging. Nodule # 2 above has likely enlarged compared to measurements of 1.4 x 1.6 x 1.2 cm previously. IMPRESSION: 1. Slowly enlarging right mid thyroid nodule meets criteria for continued imaging observation. Recommend follow-up  ultrasound in 1 year. The above is in keeping with the ACR TI-RADS recommendations - J Am Coll Radiol 2017;14:587-595. Electronically Signed   By: Jacqulynn Cadet M.D.   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 addiction risk, apnea etc) and complications (i.e. Somnolence, constipation and others) were explained to the patient and were aknowledged.      Relevant Medications   methadone (DOLOPHINE) 10 MG tablet   methadone (DOLOPHINE) 10 MG tablet   methadone (DOLOPHINE) 10 MG tablet   Other Relevant Orders   TSH   Urinalysis   CBC with Differential/Platelet   Lipid panel   Comprehensive metabolic panel   Hemoglobin A1c   AFP tumor marker   CEA   History of colon cancer   Relevant Orders   AFP tumor marker   CEA   Fatigue   Relevant Orders   TSH   Urinalysis   CBC with Differential/Platelet   Lipid panel   Comprehensive metabolic panel   Hemoglobin A1c   Diabetes mellitus type 2, controlled ([facility])    Uncontrolled Cont Amaryl Start Lantus inj D/c Metformin      Relevant Medications   glimepiride (AMARYL) 4 MG tablet   insulin glargine (LANTUS SOLOSTAR) 100 UNIT/ML Solostar Pen   Other Relevant Orders   TSH   Urinalysis   CBC with Differential/Platelet   Lipid panel   Comprehensive metabolic panel   Hemoglobin A1c   AFP tumor marker   CEA      Meds ordered this encounter  Medications   glimepiride (AMARYL) 4 MG tablet    Sig: Take 1 tablet (4 mg total) by mouth 2 (two) times daily.    Dispense:  180 tablet    Refill:  3   methadone (DOLOPHINE) 10 MG tablet  Sig: Take 2 tablets (20 mg total) by mouth 2 (two) times daily as needed for severe pain. For pain M54.5    Dispense:  120 tablet    Refill:  0    Please fill on or after 07/05/22   methadone  (DOLOPHINE) 10 MG tablet    Sig: Take 2 tablets (20 mg total) by mouth 2 (two) times daily as needed for severe pain.    Dispense:  120 tablet    Refill:  0    Please fill on or after 08/04/22   methadone (DOLOPHINE) 10 MG tablet    Sig: Take 2 tablets (20 mg total) by mouth 2 (two) times daily as needed for severe pain.    Dispense:  120 tablet    Refill:  0    Please fill on or after 09/03/22   insulin glargine (LANTUS SOLOSTAR) 100 UNIT/ML Solostar Pen    Sig: Inject 10 Units into the skin daily. Titrate up by 1 unit every 1-2 days for goal sugar  (CBG)70-130    Dispense:  15 mL    Refill:  11      Follow-up: Return in about 3 months (around 10/06/2022) for a follow-up visit.  Walker Kehr, MD

## 2022-07-07 NOTE — Assessment & Plan Note (Signed)
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 addiction risk, apnea etc) and complications (i.e. Somnolence, constipation and others) were explained to the patient and were aknowledged.

## 2022-07-07 NOTE — Telephone Encounter (Signed)
Rec'd call from [facility] she states they are needing the frequency of how many times pt is allowed to administer insulin daily. Need new rx sent over w/ info.Marland KitchenJohny Chess

## 2022-07-08 LAB — CEA: CEA: 3 ng/mL — ABNORMAL HIGH

## 2022-07-08 LAB — AFP TUMOR MARKER: AFP-Tumor Marker: 2.5 ng/mL (ref <6.1)

## 2022-08-09 ENCOUNTER — Ambulatory Visit
Admission: RE | Admit: 2022-08-09 | Discharge: 2022-08-09 | Disposition: A | Discharge status: Home / Self Care | Payer: Medicare PPO | Source: Ambulatory Visit | Attending: Internal Medicine | Admitting: Internal Medicine

## 2022-08-09 DIAGNOSIS — E041 Nontoxic single thyroid nodule: Secondary | ICD-10-CM | POA: Diagnosis not present

## 2022-10-03 ENCOUNTER — Ambulatory Visit: Payer: Medicare PPO | Admitting: Internal Medicine

## 2022-10-03 ENCOUNTER — Encounter: Payer: Self-pay | Admitting: Internal Medicine

## 2022-10-03 VITALS — BP 130/60 | HR 94 | Temp 98.3°F | Ht 72.0 in | Wt 161.0 lb

## 2022-10-03 DIAGNOSIS — K219 Gastro-esophageal reflux disease without esophagitis: Secondary | ICD-10-CM

## 2022-10-03 DIAGNOSIS — Z85038 Personal history of other malignant neoplasm of large intestine: Secondary | ICD-10-CM | POA: Diagnosis not present

## 2022-10-03 DIAGNOSIS — E041 Nontoxic single thyroid nodule: Secondary | ICD-10-CM | POA: Diagnosis not present

## 2022-10-03 DIAGNOSIS — E1142 Type 2 diabetes mellitus with diabetic polyneuropathy: Secondary | ICD-10-CM

## 2022-10-03 LAB — MICROALBUMIN / CREATININE URINE RATIO
Creatinine,U: 112.4 mg/dL
Microalb Creat Ratio: 1.2 mg/g (ref 0.0–30.0)
Microalb, Ur: 1.3 mg/dL (ref 0.0–1.9)

## 2022-10-03 LAB — HEMOGLOBIN A1C: Hgb A1c MFr Bld: 8.6 % — ABNORMAL HIGH (ref 4.6–6.5)

## 2022-10-03 MED ORDER — METHADONE HCL 10 MG PO TABS: 20.0000 mg | ORAL_TABLET | Freq: Two times a day (BID) | ORAL | 0 refills | Status: DC | PRN

## 2022-10-03 MED ORDER — BD PEN NEEDLE NANO U/F 32G X 4 MM MISC: 3 refills | Status: DC

## 2022-10-03 NOTE — Assessment & Plan Note (Signed)
[place] ok Repeat US in 1-2 years

## 2022-10-03 NOTE — Assessment & Plan Note (Signed)
F/u w/Dr Sherrill 

## 2022-10-03 NOTE — Assessment & Plan Note (Signed)
Cont on Glimepiride, Lantus. Pt declined [place] labs

## 2022-10-03 NOTE — Progress Notes (Signed)
Subjective:  Patient ID: Gabriel Erickson, male    DOB: 1941/04/02  Age: 82 y.o. MRN: [id number]  CC: Follow-up (3 MNTH F/U)   HPI Veral Cimo Mcevoy presents for LBP, GERD, DM    Outpatient Medications Prior to Visit  Medication Sig Dispense Refill   Alcohol Swabs PADS Use to clean finger before checking blood sugar level BID 200 each 3   b complex vitamins tablet Take 1 tablet by mouth daily.     Blood Glucose Calibration (TRUE METRIX LEVEL 2) Normal SOLN Use to calibrate meter 1 each 3   Blood Glucose Monitoring Suppl (TRUE METRIX METER) w/Device KIT Use to check blood sugar BID 1 kit 0   Cholecalciferol 1000 UNITS tablet Take 1,000 Units by mouth daily.     diazepam (VALIUM) 5 MG tablet TAKE ONE TABLET TWICE DAILY AS NEEDED FOR muscle spasms, cramps, or insomnia 60 tablet 2   famotidine (PEPCID) 40 MG tablet Take 1 tablet (40 mg total) by mouth daily. 90 tablet 3   fluticasone (FLONASE) 50 MCG/ACT nasal spray Place 2 sprays into the nose daily. 16 g 11   glimepiride (AMARYL) 4 MG tablet Take 1 tablet (4 mg total) by mouth 2 (two) times daily. 180 tablet 3   glucose blood (TRUE METRIX BLOOD GLUCOSE TEST) test strip TEST BLOOD SUGAR TWICE DAILY 200 strip 3   insulin glargine (LANTUS SOLOSTAR) 100 UNIT/ML Solostar Pen Inject 10 Units into the skin daily. Titrate up by 1 unit every 1-2 days for goal sugar  (CBG)70-130 15 mL 11   loratadine (CLARITIN) 10 MG tablet Take 1 tablet (10 mg total) by mouth daily. 90 tablet 3   naloxone (NARCAN) nasal spray 4 mg/0.1 mL 1 actuation in one nostril once. May repeat in 2-3 min 1 each 2   RABEprazole (ACIPHEX) 20 MG tablet Take 1 tablet (20 mg total) by mouth daily. 30 tablet 11   tadalafil (CIALIS) 20 MG tablet Take 0.5-1 tablets (10-20 mg total) by mouth daily as needed for erectile dysfunction. 30 tablet 3   TRUEplus Lancets 30G MISC Use to check blood sugar BID 200 each 5   glimepiride (AMARYL) 4 MG tablet Take 1 tablet (4 mg total) by mouth 2 (two) times  daily. 180 tablet 3   methadone (DOLOPHINE) 10 MG tablet Take 2 tablets (20 mg total) by mouth 2 (two) times daily as needed for severe pain. For pain M54.5 120 tablet 0   methadone (DOLOPHINE) 10 MG tablet Take 2 tablets (20 mg total) by mouth 2 (two) times daily as needed for severe pain. 120 tablet 0   methadone (DOLOPHINE) 10 MG tablet Take 2 tablets (20 mg total) by mouth 2 (two) times daily as needed for severe pain. 120 tablet 0   No facility-administered medications prior to visit.    ROS: Review of Systems  Constitutional:  Negative for appetite change, fatigue and unexpected weight change.  HENT:  Negative for congestion, nosebleeds, sneezing, sore throat and trouble swallowing.   Eyes:  Negative for itching and visual disturbance.  Respiratory:  Negative for cough.   Cardiovascular:  Negative for chest pain, palpitations and leg swelling.  Gastrointestinal:  Negative for abdominal distention, blood in stool, diarrhea and nausea.  Genitourinary:  Negative for frequency and hematuria.  Musculoskeletal:  Positive for back pain and gait problem. Negative for joint swelling and neck pain.  Skin:  Negative for rash.  Neurological:  Negative for dizziness, tremors, speech difficulty and weakness.  Psychiatric/Behavioral:  Negative for agitation, dysphoric mood and sleep disturbance. The patient is not nervous/anxious.     Objective:  BP 130/60 (BP Location: Left Arm, Patient Position: Sitting, Cuff Size: Large)   Pulse 94   Temp 98.3 F (36.8 C) (Oral)   Ht 6' (1.829 m)   Wt 161 lb (73 kg)   SpO2 97%   BMI 21.84 kg/m   BP Readings from Last 3 Encounters:  10/03/22 130/60  07/07/22 136/82  04/07/22 (!) 162/70    Wt Readings from Last 3 Encounters:  10/03/22 161 lb (73 kg)  07/07/22 159 lb (72.1 kg)  04/07/22 156 lb 6.4 oz (70.9 kg)    Physical Exam Constitutional:      General: He is not in acute distress.    Appearance: Normal appearance. He is well-developed.      Comments: NAD  Eyes:     Conjunctiva/sclera: Conjunctivae normal.     Pupils: Pupils are equal, round, and reactive to light.  Neck:     Thyroid: No thyromegaly.     Vascular: No JVD.  Cardiovascular:     Rate and Rhythm: Normal rate and regular rhythm.     Heart sounds: Normal heart sounds. No murmur heard.    No friction rub. No gallop.  Pulmonary:     Effort: Pulmonary effort is normal. No respiratory distress.     Breath sounds: Normal breath sounds. No wheezing or rales.  Chest:     Chest wall: No tenderness.  Abdominal:     General: Bowel sounds are normal. There is no distension.     Palpations: Abdomen is soft. There is no mass.     Tenderness: There is no abdominal tenderness. There is no guarding or rebound.  Musculoskeletal:        General: Tenderness present. Normal range of motion.     Cervical back: Normal range of motion.     Right lower leg: No edema.     Left lower leg: No edema.  Lymphadenopathy:     Cervical: No cervical adenopathy.  Skin:    General: Skin is warm and dry.     Findings: No rash.  Neurological:     Mental Status: He is alert and oriented to person, place, and time.     Cranial Nerves: No cranial nerve deficit.     Motor: No abnormal muscle tone.     Coordination: Coordination normal.     Gait: Gait abnormal.     Deep Tendon Reflexes: Reflexes are normal and symmetric.  Psychiatric:        Behavior: Behavior normal.        Thought Content: Thought content normal.        Judgment: Judgment normal.    Ls w/pain Weak LEs   Lab Results  Component Value Date   WBC 6.9 07/07/2022   HGB 14.8 07/07/2022   HCT 43.3 07/07/2022   PLT 246.0 07/07/2022   GLUCOSE 142 (H) 07/07/2022   CHOL 190 07/07/2022   TRIG 149.0 07/07/2022   HDL 38.80 (L) 07/07/2022   LDLDIRECT 142.3 04/12/2010   LDLCALC 121 (H) 07/07/2022   ALT 19 07/07/2022   AST 20 07/07/2022   NA 138 07/07/2022   K 4.6 07/07/2022   CL 100 07/07/2022   CREATININE 0.77  07/07/2022   BUN 17 07/07/2022   CO2 33 (H) 07/07/2022   TSH 1.03 07/07/2022   PSA 2.32 07/07/2021   HGBA1C 8.7 (H) 07/07/2022   MICROALBUR <0.7 06/19/2017  US THYROID  Result Date: 08/11/2022 CLINICAL DATA:  Prior ultrasound follow-up. TI-RADS category 3 nodule in the right mid gland currently under imaging surveillance. EXAM: THYROID ULTRASOUND TECHNIQUE: Ultrasound examination of the thyroid gland and adjacent soft tissues was performed. COMPARISON:  Prior thyroid ultrasound 08/04/2021 FINDINGS: Parenchymal Echotexture: Markedly heterogenous Isthmus: 0.3 cm Right lobe: 4.7 x 2.3 x 2.6 cm Left lobe: 5.2 x 2.8 x 2.0 cm _________________________________________________________ Estimated total number of nodules >/= 1 cm: 2 Number of spongiform nodules >/=  2 cm not described below (TR1): 0 Number of mixed cystic and solid nodules >/= 1.5 cm not described below ([place]): 0 _________________________________________________________ Nodule # 1: Small isoechoic nodule in the right upper gland measures only 1.2 cm. This does not meet criteria for further evaluation. Nodule # 2: Vague region of pseudo nodularity in the right inferior gland. This lesion is not clearly defined from the background thyroid parenchyma and is not considered a true thyroid nodule. Nodule # 3: Continued stability of isoechoic solid nodule (TI-RADS category 3) which is very ill-defined in the left mid gland at 2.2 x 1.7 x 1.7 cm compared to 2.2 x 2.0 x 1.7 cm previously. *Given size (>/= 1.5 - 2.4 cm) and appearance, a follow-up ultrasound in 1 year should be considered based on TI-RADS criteria. No new nodules or suspicious features. IMPRESSION: 1. Confirmed 1 year stability of TI-RADS category 3 nodule in the left mid gland. Recommend continued follow-up ultrasound every 1-2 years until 5 year stability is confirmed. 2. Diffusely enlarged and heterogeneous thyroid gland. 3. No new nodules or suspicious features. The above is in keeping  with the ACR TI-RADS recommendations - J Am Coll Radiol 2017;14:587-595. Electronically Signed   By: Jacqulynn Cadet M.D.   On: 08/11/2022 09:07    Assessment & Plan:   Problem List Items Addressed This Visit       Digestive   GERD    On Aciphex po        Endocrine   Thyroid nodule    [place] ok Repeat US in 1-2 years      Diabetes mellitus type 2, controlled ([place]) - Primary    Cont on Glimepiride, Lantus. Pt declined CGMCheck labs      Relevant Orders   Hemoglobin A1c   Microalbumin / creatinine urine ratio   Hepatitis C Antibody     Other   History of colon cancer    F/u w/Dr Benay Spice         Meds ordered this encounter  Medications   Insulin Pen Needle (BD PEN NEEDLE NANO U/F) 32G X 4 MM MISC    Sig: As directed    Dispense:  100 each    Refill:  3   methadone (DOLOPHINE) 10 MG tablet    Sig: Take 2 tablets (20 mg total) by mouth 2 (two) times daily as needed for severe pain. For pain M54.5    Dispense:  120 tablet    Refill:  0    Please fill on or after 10/03/22   methadone (DOLOPHINE) 10 MG tablet    Sig: Take 2 tablets (20 mg total) by mouth 2 (two) times daily as needed for severe pain.    Dispense:  120 tablet    Refill:  0    Please fill on or after 11/02/22   methadone (DOLOPHINE) 10 MG tablet    Sig: Take 2 tablets (20 mg total) by mouth 2 (two) times daily as needed for severe pain.  Dispense:  120 tablet    Refill:  0    Please fill on or after 12/02/22      Follow-up: Return in about 3 months (around 01/03/2023) for a follow-up visit.  Walker Kehr, MD

## 2022-10-03 NOTE — Assessment & Plan Note (Signed)
On Aciphex po

## 2022-10-04 LAB — HEPATITIS C ANTIBODY: Hepatitis C Ab: NONREACTIVE

## 2022-10-19 ENCOUNTER — Other Ambulatory Visit: Payer: Self-pay | Admitting: *Deleted

## 2022-10-19 MED ORDER — TRUE METRIX AIR GLUCOSE METER W/DEVICE KIT: PACK | 0 refills | Status: AC

## 2022-10-19 MED ORDER — TRUEPLUS LANCETS 30G MISC: 5 refills | Status: DC

## 2022-10-19 MED ORDER — TRUE METRIX BLOOD GLUCOSE TEST VI STRP: ORAL_STRIP | 3 refills | Status: DC

## 2022-10-20 MED ORDER — METHADONE HCL 10 MG PO TABS: 20.0000 mg | ORAL_TABLET | Freq: Two times a day (BID) | ORAL | 0 refills | Status: DC | PRN

## 2022-11-18 ENCOUNTER — Telehealth: Payer: Self-pay | Admitting: *Deleted

## 2022-11-18 NOTE — Telephone Encounter (Signed)
Rec'd fax pt needing refill on Methadone.Marland KitchenRaechel Chute

## 2022-11-22 MED ORDER — METHADONE HCL 10 MG PO TABS: 20.0000 mg | ORAL_TABLET | Freq: Two times a day (BID) | ORAL | 0 refills | Status: DC | PRN

## 2022-11-22 NOTE — Telephone Encounter (Signed)
May and June prescriptions are at the pharmacy.  Thanks

## 2022-11-23 NOTE — Telephone Encounter (Signed)
Notified pt w/MD response.../lmb 

## 2022-11-28 NOTE — Telephone Encounter (Signed)
Patient called and said that the pharmacy has Gabriel Erickson's script but they do not have one for May.  Please resend May's script.

## 2022-11-29 MED ORDER — METHADONE HCL 10 MG PO TABS: 20.0000 mg | ORAL_TABLET | Freq: Two times a day (BID) | ORAL | 0 refills | Status: DC | PRN

## 2022-11-29 NOTE — Telephone Encounter (Signed)
There are prescriptions for April, May and June on file.  I resent a May prescription again.  Thanks

## 2022-11-29 NOTE — Addendum Note (Signed)
Addended by: Tresa Garter on: 11/29/2022 12:29 AM   Modules accepted: Orders

## 2022-12-23 ENCOUNTER — Other Ambulatory Visit: Payer: Self-pay | Admitting: Internal Medicine

## 2022-12-27 ENCOUNTER — Other Ambulatory Visit: Payer: Self-pay | Admitting: *Deleted

## 2022-12-27 MED ORDER — RABEPRAZOLE SODIUM 20 MG PO TBEC: 20.0000 mg | DELAYED_RELEASE_TABLET | Freq: Every day | ORAL | 11 refills | Status: DC

## 2023-01-03 ENCOUNTER — Ambulatory Visit: Payer: Medicare PPO | Admitting: Internal Medicine

## 2023-01-03 ENCOUNTER — Encounter: Payer: Self-pay | Admitting: Internal Medicine

## 2023-01-03 VITALS — BP 118/82 | HR 75 | Temp 98.2°F | Ht 72.0 in | Wt 163.0 lb

## 2023-01-03 DIAGNOSIS — E1142 Type 2 diabetes mellitus with diabetic polyneuropathy: Secondary | ICD-10-CM

## 2023-01-03 DIAGNOSIS — G8929 Other chronic pain: Secondary | ICD-10-CM

## 2023-01-03 DIAGNOSIS — Z85038 Personal history of other malignant neoplasm of large intestine: Secondary | ICD-10-CM | POA: Diagnosis not present

## 2023-01-03 DIAGNOSIS — K219 Gastro-esophageal reflux disease without esophagitis: Secondary | ICD-10-CM

## 2023-01-03 DIAGNOSIS — M5440 Lumbago with sciatica, unspecified side: Secondary | ICD-10-CM | POA: Diagnosis not present

## 2023-01-03 DIAGNOSIS — Z7984 Long term (current) use of oral hypoglycemic drugs: Secondary | ICD-10-CM | POA: Diagnosis not present

## 2023-01-03 LAB — CBC WITH DIFFERENTIAL/PLATELET
Basophils Absolute: 0 10*3/uL (ref 0.0–0.1)
Basophils Relative: 0.4 % (ref 0.0–3.0)
Eosinophils Absolute: 0.1 10*3/uL (ref 0.0–0.7)
Eosinophils Relative: 1 % (ref 0.0–5.0)
HCT: 44.5 % (ref 39.0–52.0)
Hemoglobin: 14.6 g/dL (ref 13.0–17.0)
Lymphocytes Relative: 23.4 % (ref 12.0–46.0)
Lymphs Abs: 1.6 10*3/uL (ref 0.7–4.0)
MCHC: 32.9 g/dL (ref 30.0–36.0)
MCV: 96.2 fl (ref 78.0–100.0)
Monocytes Absolute: 0.6 10*3/uL (ref 0.1–1.0)
Monocytes Relative: 8.2 % (ref 3.0–12.0)
Neutro Abs: 4.7 10*3/uL (ref 1.4–7.7)
Neutrophils Relative %: 67 % (ref 43.0–77.0)
Platelets: 252 10*3/uL (ref 150.0–400.0)
RBC: 4.62 Mil/uL (ref 4.22–5.81)
RDW: 12.4 % (ref 11.5–15.5)
WBC: 7 10*3/uL (ref 4.0–10.5)

## 2023-01-03 LAB — COMPREHENSIVE METABOLIC PANEL
ALT: 20 U/L (ref 0–53)
AST: 21 U/L (ref 0–37)
Albumin: 4.3 g/dL (ref 3.5–5.2)
Alkaline Phosphatase: 72 U/L (ref 39–117)
BUN: 17 mg/dL (ref 6–23)
CO2: 33 mEq/L — ABNORMAL HIGH (ref 19–32)
Calcium: 9.7 mg/dL (ref 8.4–10.5)
Chloride: 99 mEq/L (ref 96–112)
Creatinine, Ser: 0.79 mg/dL (ref 0.40–1.50)
GFR: 84.37 mL/min (ref >60.00)
Glucose, Bld: 125 mg/dL — ABNORMAL HIGH (ref 70–99)
Potassium: 5.1 mEq/L (ref 3.5–5.1)
Sodium: 137 mEq/L (ref 135–145)
Total Bilirubin: 0.5 mg/dL (ref 0.2–1.2)
Total Protein: 8 g/dL (ref 6.0–8.3)

## 2023-01-03 LAB — HEMOGLOBIN A1C: Hgb A1c MFr Bld: 8 % — ABNORMAL HIGH (ref 4.6–6.5)

## 2023-01-03 MED ORDER — METHADONE HCL 10 MG PO TABS: 20.0000 mg | ORAL_TABLET | Freq: Two times a day (BID) | ORAL | 0 refills | Status: DC | PRN

## 2023-01-03 MED ORDER — RABEPRAZOLE SODIUM 20 MG PO TBEC: 20.0000 mg | DELAYED_RELEASE_TABLET | Freq: Every day | ORAL | 3 refills | Status: DC

## 2023-01-03 NOTE — Assessment & Plan Note (Signed)
On Aciphex po 

## 2023-01-03 NOTE — Assessment & Plan Note (Signed)
Cont on Glimepiride, Lantus. Pt declined CGM

## 2023-01-03 NOTE — Assessment & Plan Note (Signed)
Keep Hct <45% 

## 2023-01-03 NOTE — Progress Notes (Signed)
Subjective:  Patient ID: Gabriel Erickson, male    DOB: [birth year]  Age: 82 y.o. MRN: [id number]  CC: Follow-up (3 MNTH F/U)   HPI Gabriel Erickson presents for LBP, DM, GERD  Outpatient Medications Prior to Visit  Medication Sig Dispense Refill   Alcohol Swabs PADS Use to clean finger before checking blood sugar level BID 200 each 3   b complex vitamins tablet Take 1 tablet by mouth daily.     Blood Glucose Calibration (TRUE METRIX LEVEL 2) Normal SOLN Use to calibrate meter 1 each 3   Blood Glucose Monitoring Suppl (TRUE METRIX AIR GLUCOSE METER) w/Device KIT Use as directed to check blood sugars 1 kit 0   Blood Glucose Monitoring Suppl (TRUE METRIX METER) w/Device KIT Use to check blood sugar BID 1 kit 0   Cholecalciferol 1000 UNITS tablet Take 1,000 Units by mouth daily.     diazepam (VALIUM) 5 MG tablet TAKE ONE TABLET TWICE DAILY AS NEEDED FOR muscle spasms, cramps, or insomnia 60 tablet 2   DROPLET PEN NEEDLES 32G X 4 MM MISC USE AS DIRECTED 300 each 3   famotidine (PEPCID) 40 MG tablet Take 1 tablet (40 mg total) by mouth daily. 90 tablet 3   fluticasone (FLONASE) 50 MCG/ACT nasal spray Place 2 sprays into the nose daily. 16 g 11   glimepiride (AMARYL) 4 MG tablet Take 1 tablet (4 mg total) by mouth 2 (two) times daily. 180 tablet 3   glucose blood (TRUE METRIX BLOOD GLUCOSE TEST) test strip TEST BLOOD SUGAR TWICE DAILY 200 strip 3   insulin glargine (LANTUS SOLOSTAR) 100 UNIT/ML Solostar Pen Inject 10 Units into the skin daily. Titrate up by 1 unit every 1-2 days for goal sugar  (CBG)70-130 15 mL 11   loratadine (CLARITIN) 10 MG tablet Take 1 tablet (10 mg total) by mouth daily. 90 tablet 3   naloxone (NARCAN) nasal spray 4 mg/0.1 mL 1 actuation in one nostril once. May repeat in 2-3 min 1 each 2   tadalafil (CIALIS) 20 MG tablet Take 0.5-1 tablets (10-20 mg total) by mouth daily as needed for erectile dysfunction. 30 tablet 3   TRUEplus Lancets 30G MISC Use to check blood sugar BID 200  each 5   methadone (DOLOPHINE) 10 MG tablet Take 2 tablets (20 mg total) by mouth 2 (two) times daily as needed for severe pain. 120 tablet 0   methadone (DOLOPHINE) 10 MG tablet Take 2 tablets (20 mg total) by mouth 2 (two) times daily as needed for severe pain. For pain M54.5 120 tablet 0   methadone (DOLOPHINE) 10 MG tablet Take 2 tablets (20 mg total) by mouth 2 (two) times daily as needed for severe pain. 120 tablet 0   RABEprazole (ACIPHEX) 20 MG tablet Take 1 tablet (20 mg total) by mouth daily. 30 tablet 11   No facility-administered medications prior to visit.    ROS: Review of Systems  Constitutional:  Positive for fatigue. Negative for appetite change and unexpected weight change.  HENT:  Negative for congestion, nosebleeds, sneezing, sore throat and trouble swallowing.   Eyes:  Negative for itching and visual disturbance.  Respiratory:  Negative for cough.   Cardiovascular:  Negative for chest pain, palpitations and leg swelling.  Gastrointestinal:  Negative for abdominal distention, blood in stool, diarrhea and nausea.  Genitourinary:  Negative for frequency and hematuria.  Musculoskeletal:  Positive for arthralgias and back pain. Negative for gait problem, joint swelling and neck pain.  Skin:  Negative for rash.  Neurological:  Negative for dizziness, tremors, speech difficulty and weakness.  Psychiatric/Behavioral:  Negative for agitation, dysphoric mood and sleep disturbance. The patient is not nervous/anxious.     Objective:  BP 118/82 (BP Location: Right Arm, Patient Position: Sitting, Cuff Size: Normal)   Pulse 75   Temp 98.2 F (36.8 C) (Oral)   Ht 6' (1.829 m)   Wt 163 lb (73.9 kg)   SpO2 96%   BMI 22.11 kg/m   BP Readings from Last 3 Encounters:  01/03/23 118/82  10/03/22 130/60  07/07/22 136/82    Wt Readings from Last 3 Encounters:  01/03/23 163 lb (73.9 kg)  10/03/22 161 lb (73 kg)  07/07/22 159 lb (72.1 kg)    Physical Exam Constitutional:       General: He is not in acute distress.    Appearance: Normal appearance. He is well-developed.     Comments: NAD  Eyes:     Conjunctiva/sclera: Conjunctivae normal.     Pupils: Pupils are equal, round, and reactive to light.  Neck:     Thyroid: No thyromegaly.     Vascular: No JVD.  Cardiovascular:     Rate and Rhythm: Normal rate and regular rhythm.     Heart sounds: Normal heart sounds. No murmur heard.    No friction rub. No gallop.  Pulmonary:     Effort: Pulmonary effort is normal. No respiratory distress.     Breath sounds: Normal breath sounds. No wheezing or rales.  Chest:     Chest wall: No tenderness.  Abdominal:     General: Bowel sounds are normal. There is no distension.     Palpations: Abdomen is soft. There is no mass.     Tenderness: There is no abdominal tenderness. There is no guarding or rebound.  Musculoskeletal:        General: Tenderness present. Normal range of motion.     Cervical back: Normal range of motion.  Lymphadenopathy:     Cervical: No cervical adenopathy.  Skin:    General: Skin is warm and dry.     Findings: No rash.  Neurological:     Mental Status: He is alert and oriented to person, place, and time.     Cranial Nerves: No cranial nerve deficit.     Motor: Weakness present. No abnormal muscle tone.     Coordination: Coordination normal.     Gait: Gait abnormal.     Deep Tendon Reflexes: Reflexes are normal and symmetric.  Psychiatric:        Behavior: Behavior normal.        Thought Content: Thought content normal.        Judgment: Judgment normal.    LS w/pain LEs - weak   Lab Results  Component Value Date   WBC 6.9 07/07/2022   HGB 14.8 07/07/2022   HCT 43.3 07/07/2022   PLT 246.0 07/07/2022   GLUCOSE 142 (H) 07/07/2022   CHOL 190 07/07/2022   TRIG 149.0 07/07/2022   HDL 38.80 (L) 07/07/2022   LDLDIRECT 142.3 04/12/2010   LDLCALC 121 (H) 07/07/2022   ALT 19 07/07/2022   AST 20 07/07/2022   NA 138 07/07/2022   K 4.6  07/07/2022   CL 100 07/07/2022   CREATININE 0.77 07/07/2022   BUN 17 07/07/2022   CO2 33 (H) 07/07/2022   TSH 1.03 07/07/2022   PSA 2.32 07/07/2021   HGBA1C 8.6 (H) 10/03/2022   MICROALBUR 1.3 10/03/2022  US THYROID  Result Date: 08/11/2022 CLINICAL DATA:  Prior ultrasound follow-up. TI-RADS category 3 nodule in the right mid gland currently under imaging surveillance. EXAM: THYROID ULTRASOUND TECHNIQUE: Ultrasound examination of the thyroid gland and adjacent soft tissues was performed. COMPARISON:  Prior thyroid ultrasound 08/04/2021 FINDINGS: Parenchymal Echotexture: Markedly heterogenous Isthmus: 0.3 cm Right lobe: 4.7 x 2.3 x 2.6 cm Left lobe: 5.2 x 2.8 x 2.0 cm _________________________________________________________ Estimated total number of nodules >/= 1 cm: 2 Number of spongiform nodules >/=  2 cm not described below (TR1): 0 Number of mixed cystic and solid nodules >/= 1.5 cm not described below (TR2): 0 _________________________________________________________ Nodule # 1: Small isoechoic nodule in the right upper gland measures only 1.2 cm. This does not meet criteria for further evaluation. Nodule # 2: Vague region of pseudo nodularity in the right inferior gland. This lesion is not clearly defined from the background thyroid parenchyma and is not considered a true thyroid nodule. Nodule # 3: Continued stability of isoechoic solid nodule (TI-RADS category 3) which is very ill-defined in the left mid gland at 2.2 x 1.7 x 1.7 cm compared to 2.2 x 2.0 x 1.7 cm previously. *Given size (>/= 1.5 - 2.4 cm) and appearance, a follow-up ultrasound in 1 year should be considered based on TI-RADS criteria. No new nodules or suspicious features. IMPRESSION: 1. Confirmed 1 year stability of TI-RADS category 3 nodule in the left mid gland. Recommend continued follow-up ultrasound every 1-2 years until 5 year stability is confirmed. 2. Diffusely enlarged and heterogeneous thyroid gland. 3. No new nodules  or suspicious features. The above is in keeping with the ACR TI-RADS recommendations - J Am Coll Radiol 2017;14:587-595. Electronically Signed   By: Malachy Moan M.D.   On: 08/11/2022 09:07    Assessment & Plan:   Problem List Items Addressed This Visit     Diabetes mellitus type 2, controlled ([facility]) - Primary    Cont on Glimepiride, Lantus. 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 addiction risk, apnea etc) and complications (i.e. Somnolence, constipation and others) were explained to the patient and were aknowledged. 3/20 - reducing the dose per Gabriel Erickson's request w/caution Diazepam Benzo use risk is discussed - taking very little Narcane option discussed      Relevant Medications   methadone (DOLOPHINE) 10 MG tablet   methadone (DOLOPHINE) 10 MG tablet   methadone (DOLOPHINE) 10 MG tablet   Other Relevant Orders   CBC with Differential/Platelet   History of colon cancer    F/u w/Dr Truett Perna      Relevant Orders   CBC with Differential/Platelet   CEA      Meds ordered this encounter  Medications   methadone (DOLOPHINE) 10 MG tablet    Sig: Take 2 tablets (20 mg total) by mouth 2 (two) times daily as needed for severe pain. For pain M54.5    Dispense:  120 tablet    Refill:  0    Please fill on or after 04/01/23   RABEprazole (ACIPHEX) 20 MG tablet    Sig: Take 1 tablet (20 mg total) by mouth daily.    Dispense:  90 tablet    Refill:  3   methadone (DOLOPHINE) 10 MG tablet     Sig: Take 2 tablets (20 mg total) by mouth 2 (two) times daily as needed for severe pain.    Dispense:  120 tablet    Refill:  0    Please fill on or after 01/31/23   methadone (DOLOPHINE) 10 MG tablet    Sig: Take 2 tablets (20 mg total) by mouth 2 (two) times daily as needed for severe pain.    Dispense:  120 tablet    Refill:  0    Please fill on or after 03/02/23      Follow-up: No follow-ups on file.  Sonda Primes, MD

## 2023-01-03 NOTE — Assessment & Plan Note (Signed)
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 addiction risk, apnea etc) and complications (i.e. Somnolence, constipation and others) were explained to the patient and were aknowledged. 3/20 - reducing the dose per Abrham's request w/caution Diazepam Benzo use risk is discussed - taking very little Narcane option discussed

## 2023-01-03 NOTE — Assessment & Plan Note (Signed)
F/u w/Dr Sherrill 

## 2023-01-04 LAB — CEA: CEA: 3.7 ng/mL — ABNORMAL HIGH

## 2023-01-16 NOTE — Addendum Note (Signed)
Addended by: Tresa Garter on: 01/16/2023 07:21 AM   Modules accepted: Orders

## 2023-01-18 ENCOUNTER — Telehealth: Payer: Self-pay | Admitting: *Deleted

## 2023-01-18 NOTE — Telephone Encounter (Signed)
Tried to call Mr Garman again concerning his labs. No answer but left msg w/ MD response. Also will be mailing copy of labs to pt address.Marland KitchenRaechel Chute

## 2023-03-05 ENCOUNTER — Other Ambulatory Visit: Payer: Self-pay | Admitting: Internal Medicine

## 2023-03-28 ENCOUNTER — Other Ambulatory Visit (INDEPENDENT_AMBULATORY_CARE_PROVIDER_SITE_OTHER): Payer: Medicare PPO

## 2023-03-28 DIAGNOSIS — E1142 Type 2 diabetes mellitus with diabetic polyneuropathy: Secondary | ICD-10-CM | POA: Diagnosis not present

## 2023-03-28 DIAGNOSIS — Z85038 Personal history of other malignant neoplasm of large intestine: Secondary | ICD-10-CM

## 2023-03-28 LAB — COMPREHENSIVE METABOLIC PANEL
ALT: 22 U/L (ref 0–53)
AST: 25 U/L (ref 0–37)
Albumin: 4 g/dL (ref 3.5–5.2)
Alkaline Phosphatase: 71 U/L (ref 39–117)
BUN: 15 mg/dL (ref 6–23)
CO2: 32 meq/L (ref 19–32)
Calcium: 9.3 mg/dL (ref 8.4–10.5)
Chloride: 101 meq/L (ref 96–112)
Creatinine, Ser: 0.79 mg/dL (ref 0.40–1.50)
GFR: 84.24 mL/min (ref >60.00)
Glucose, Bld: 117 mg/dL — ABNORMAL HIGH (ref 70–99)
Potassium: 4 meq/L (ref 3.5–5.1)
Sodium: 138 meq/L (ref 135–145)
Total Bilirubin: 0.6 mg/dL (ref 0.2–1.2)
Total Protein: 7.3 g/dL (ref 6.0–8.3)

## 2023-03-28 LAB — HEMOGLOBIN A1C: Hgb A1c MFr Bld: 8.5 % — ABNORMAL HIGH (ref 4.6–6.5)

## 2023-03-29 LAB — CEA: CEA: 2.9 ng/mL — ABNORMAL HIGH

## 2023-04-04 ENCOUNTER — Encounter: Payer: Self-pay | Admitting: Internal Medicine

## 2023-04-04 ENCOUNTER — Ambulatory Visit: Payer: Medicare PPO | Admitting: Internal Medicine

## 2023-04-04 VITALS — BP 122/82 | HR 85 | Temp 98.8°F | Ht 72.0 in | Wt 167.2 lb

## 2023-04-04 DIAGNOSIS — K760 Fatty (change of) liver, not elsewhere classified: Secondary | ICD-10-CM

## 2023-04-04 DIAGNOSIS — Z794 Long term (current) use of insulin: Secondary | ICD-10-CM | POA: Diagnosis not present

## 2023-04-04 DIAGNOSIS — E1142 Type 2 diabetes mellitus with diabetic polyneuropathy: Secondary | ICD-10-CM

## 2023-04-04 DIAGNOSIS — M5440 Lumbago with sciatica, unspecified side: Secondary | ICD-10-CM | POA: Diagnosis not present

## 2023-04-04 DIAGNOSIS — Z7984 Long term (current) use of oral hypoglycemic drugs: Secondary | ICD-10-CM | POA: Diagnosis not present

## 2023-04-04 DIAGNOSIS — G8929 Other chronic pain: Secondary | ICD-10-CM | POA: Diagnosis not present

## 2023-04-04 MED ORDER — METHADONE HCL 10 MG PO TABS: 20.0000 mg | ORAL_TABLET | Freq: Two times a day (BID) | ORAL | 0 refills | Status: DC | PRN

## 2023-04-04 MED ORDER — LANTUS SOLOSTAR 100 UNIT/ML ~~LOC~~ SOPN: 10.0000 [IU] | PEN_INJECTOR | Freq: Every day | SUBCUTANEOUS | 11 refills | Status: DC

## 2023-04-04 NOTE — Assessment & Plan Note (Signed)
Chronic - on phlebotomies

## 2023-04-04 NOTE — Assessment & Plan Note (Signed)
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 addiction risk, apnea etc) and complications (i.e. Somnolence, constipation and others) were explained to the patient and were aknowledged. 3/20 - reducing the dose per Adyen's request w/caution Diazepam Benzo use risk is discussed - taking very little Narcane option discussed

## 2023-04-04 NOTE — Assessment & Plan Note (Signed)
Cont on Glimepiride Stipes wants to stay on it), Lantus - start to titrate up. Pt declined CGM

## 2023-04-04 NOTE — Progress Notes (Signed)
Subjective:  Patient ID: Gabriel Erickson, male    DOB: February 08, 1941  Age: 82 y.o. MRN: [id number]  CC: No chief complaint on file.   HPI Lavarious Bastone Ashton presents for DM, colon cancer, HTN f/u  Outpatient Medications Prior to Visit  Medication Sig Dispense Refill   Alcohol Swabs (DROPSAFE ALCOHOL PREP) 70 % PADS USE TO CLEAN FINGER BEFORE CHECKING BLOOD SUGAR LEVEL TWO TIMES DAILY 200 each 3   b complex vitamins tablet Take 1 tablet by mouth daily.     Blood Glucose Calibration (TRUE METRIX LEVEL 2) Normal SOLN Use to calibrate meter 1 each 3   Blood Glucose Monitoring Suppl (TRUE METRIX AIR GLUCOSE METER) w/Device KIT Use as directed to check blood sugars 1 kit 0   Blood Glucose Monitoring Suppl (TRUE METRIX METER) w/Device KIT Use to check blood sugar BID 1 kit 0   Cholecalciferol 1000 UNITS tablet Take 1,000 Units by mouth daily.     diazepam (VALIUM) 5 MG tablet TAKE ONE TABLET TWICE DAILY AS NEEDED FOR muscle spasms, cramps, or insomnia 60 tablet 2   DROPLET PEN NEEDLES 32G X 4 MM MISC USE AS DIRECTED 300 each 3   famotidine (PEPCID) 40 MG tablet Take 1 tablet (40 mg total) by mouth daily. 90 tablet 3   fluticasone (FLONASE) 50 MCG/ACT nasal spray Place 2 sprays into the nose daily. 16 g 11   glimepiride (AMARYL) 4 MG tablet Take 1 tablet (4 mg total) by mouth 2 (two) times daily. 180 tablet 3   glucose blood (TRUE METRIX BLOOD GLUCOSE TEST) test strip TEST BLOOD SUGAR TWICE DAILY 200 strip 3   loratadine (CLARITIN) 10 MG tablet Take 1 tablet (10 mg total) by mouth daily. 90 tablet 3   naloxone (NARCAN) nasal spray 4 mg/0.1 mL 1 actuation in one nostril once. May repeat in 2-3 min 1 each 2   RABEprazole (ACIPHEX) 20 MG tablet Take 1 tablet (20 mg total) by mouth daily. 90 tablet 3   tadalafil (CIALIS) 20 MG tablet Take 0.5-1 tablets (10-20 mg total) by mouth daily as needed for erectile dysfunction. 30 tablet 3   TRUEplus Lancets 30G MISC Use to check blood sugar BID 200 each 5   insulin  glargine (LANTUS SOLOSTAR) 100 UNIT/ML Solostar Pen Inject 10 Units into the skin daily. Titrate up by 1 unit every 1-2 days for goal sugar  (CBG)70-130 15 mL 11   methadone (DOLOPHINE) 10 MG tablet Take 2 tablets (20 mg total) by mouth 2 (two) times daily as needed for severe pain. For pain M54.5 120 tablet 0   methadone (DOLOPHINE) 10 MG tablet Take 2 tablets (20 mg total) by mouth 2 (two) times daily as needed for severe pain. (Patient not taking: Reported on 04/04/2023) 120 tablet 0   methadone (DOLOPHINE) 10 MG tablet Take 2 tablets (20 mg total) by mouth 2 (two) times daily as needed for severe pain. (Patient not taking: Reported on 04/04/2023) 120 tablet 0   No facility-administered medications prior to visit.    ROS: Review of Systems  Constitutional:  Negative for appetite change, fatigue and unexpected weight change.  HENT:  Negative for congestion, nosebleeds, sneezing, sore throat and trouble swallowing.   Eyes:  Negative for itching and visual disturbance.  Respiratory:  Negative for cough.   Cardiovascular:  Negative for chest pain, palpitations and leg swelling.  Gastrointestinal:  Negative for abdominal distention, blood in stool, diarrhea and nausea.  Genitourinary:  Negative for frequency and  hematuria.  Musculoskeletal:  Negative for back pain, gait problem, joint swelling and neck pain.  Skin:  Negative for rash.  Neurological:  Negative for dizziness, tremors, speech difficulty and weakness.  Psychiatric/Behavioral:  Negative for agitation, dysphoric mood and sleep disturbance. The patient is not nervous/anxious.     Objective:  BP 122/82 (BP Location: Left Arm, Patient Position: Sitting, Cuff Size: Normal)   Pulse 85   Temp 98.8 F (37.1 C) (Oral)   Ht 6' (1.829 m)   Wt 167 lb 3.2 oz (75.8 kg)   SpO2 97%   BMI 22.68 kg/m   BP Readings from Last 3 Encounters:  04/04/23 122/82  01/03/23 118/82  10/03/22 130/60    Wt Readings from Last 3 Encounters:  04/04/23  167 lb 3.2 oz (75.8 kg)  01/03/23 163 lb (73.9 kg)  10/03/22 161 lb (73 kg)    Physical Exam Constitutional:      General: He is not in acute distress.    Appearance: He is well-developed.     Comments: NAD  Eyes:     Conjunctiva/sclera: Conjunctivae normal.     Pupils: Pupils are equal, round, and reactive to light.  Neck:     Thyroid: No thyromegaly.     Vascular: No JVD.  Cardiovascular:     Rate and Rhythm: Normal rate and regular rhythm.     Heart sounds: Normal heart sounds. No murmur heard.    No friction rub. No gallop.  Pulmonary:     Effort: Pulmonary effort is normal. No respiratory distress.     Breath sounds: Normal breath sounds. No wheezing or rales.  Chest:     Chest wall: No tenderness.  Abdominal:     General: Bowel sounds are normal. There is no distension.     Palpations: Abdomen is soft. There is no mass.     Tenderness: There is no abdominal tenderness. There is no guarding or rebound.  Musculoskeletal:        General: No tenderness. Normal range of motion.     Cervical back: Normal range of motion.  Lymphadenopathy:     Cervical: No cervical adenopathy.  Skin:    General: Skin is warm and dry.     Findings: No rash.  Neurological:     Mental Status: He is alert and oriented to person, place, and time.     Cranial Nerves: No cranial nerve deficit.     Motor: No abnormal muscle tone.     Coordination: Coordination normal.     Gait: Gait normal.     Deep Tendon Reflexes: Reflexes are normal and symmetric.  Psychiatric:        Behavior: Behavior normal.        Thought Content: Thought content normal.        Judgment: Judgment normal.   LS w/pain  Lab Results  Component Value Date   WBC 7.0 01/03/2023   HGB 14.6 01/03/2023   HCT 44.5 01/03/2023   PLT 252.0 01/03/2023   GLUCOSE 117 (H) 03/28/2023   CHOL 190 07/07/2022   TRIG 149.0 07/07/2022   HDL 38.80 (L) 07/07/2022   LDLDIRECT 142.3 04/12/2010   LDLCALC 121 (H) 07/07/2022   ALT 22  03/28/2023   AST 25 03/28/2023   NA 138 03/28/2023   K 4.0 03/28/2023   CL 101 03/28/2023   CREATININE 0.79 03/28/2023   BUN 15 03/28/2023   CO2 32 03/28/2023   TSH 1.03 07/07/2022   PSA 2.32 07/07/2021  HGBA1C 8.5 (H) 03/28/2023   MICROALBUR 1.3 10/03/2022    US THYROID  Result Date: 08/11/2022 CLINICAL DATA:  Prior ultrasound follow-up. TI-RADS category 3 nodule in the right mid gland currently under imaging surveillance. EXAM: THYROID ULTRASOUND TECHNIQUE: Ultrasound examination of the thyroid gland and adjacent soft tissues was performed. COMPARISON:  Prior thyroid ultrasound 08/04/2021 FINDINGS: Parenchymal Echotexture: Markedly heterogenous Isthmus: 0.3 cm Right lobe: 4.7 x 2.3 x 2.6 cm Left lobe: 5.2 x 2.8 x 2.0 cm _________________________________________________________ Estimated total number of nodules >/= 1 cm: 2 Number of spongiform nodules >/=  2 cm not described below (TR1): 0 Number of mixed cystic and solid nodules >/= 1.5 cm not described below (TR2): 0 _________________________________________________________ Nodule # 1: Small isoechoic nodule in the right upper gland measures only 1.2 cm. This does not meet criteria for further evaluation. Nodule # 2: Vague region of pseudo nodularity in the right inferior gland. This lesion is not clearly defined from the background thyroid parenchyma and is not considered a true thyroid nodule. Nodule # 3: Continued stability of isoechoic solid nodule (TI-RADS category 3) which is very ill-defined in the left mid gland at 2.2 x 1.7 x 1.7 cm compared to 2.2 x 2.0 x 1.7 cm previously. *Given size (>/= 1.5 - 2.4 cm) and appearance, a follow-up ultrasound in 1 year should be considered based on TI-RADS criteria. No new nodules or suspicious features. IMPRESSION: 1. Confirmed 1 year stability of TI-RADS category 3 nodule in the left mid gland. Recommend continued follow-up ultrasound every 1-2 years until 5 year stability is confirmed. 2. Diffusely  enlarged and heterogeneous thyroid gland. 3. No new nodules or suspicious features. The above is in keeping with the ACR TI-RADS recommendations - J Am Coll Radiol 2017;14:587-595. Electronically Signed   By: Malachy Moan M.D.   On: 08/11/2022 09:07    Assessment & Plan:   Problem List Items Addressed This Visit     Diabetes mellitus type 2, controlled (HCC) - Primary    Cont on Glimepiride Peyton Najjar wants to stay on it), Lantus - start to titrate up. Pt declined CGM      Relevant Medications   insulin glargine (LANTUS SOLOSTAR) 100 UNIT/ML Solostar Pen   Other Relevant Orders   Hemoglobin A1c   Comprehensive metabolic panel   AFP tumor marker   PSA   TSH   Microalbumin / creatinine urine ratio   Pain Management Screening Profile (10S)   Hemochromatosis    Chronic - on phlebotomies      Relevant Orders   Hemoglobin A1c   Comprehensive metabolic panel   AFP tumor marker   PSA   TSH   Microalbumin / creatinine urine ratio   Pain Management Screening Profile (10S)   Hepatic steatosis    Treat DM. Cont on Glimepiride Olivos wants to stay on it), Lantus - start to titrate up. 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 addiction risk, apnea etc) and complications (i.e. Somnolence, constipation and others) were explained to the patient and were aknowledged. 3/20 - reducing the dose per Dai's request  w/caution Diazepam Benzo use risk is discussed - taking very little Narcane option discussed      Relevant Medications   methadone (DOLOPHINE) 10 MG tablet   methadone (DOLOPHINE) 10 MG tablet   methadone (DOLOPHINE) 10 MG tablet   Other Relevant Orders    Pain Management Screening Profile (10S)      Meds ordered this encounter  Medications   insulin glargine (LANTUS SOLOSTAR) 100 UNIT/ML Solostar Pen    Sig: Inject 10 Units into the skin daily. Titrate up by 1 unit every 1-2 days for goal sugar  (CBG) 100-130    Dispense:  15 mL    Refill:  11   methadone (DOLOPHINE) 10 MG tablet    Sig: Take 2 tablets (20 mg total) by mouth 2 (two) times daily as needed for severe pain. For pain M54.5    Dispense:  120 tablet    Refill:  0    Please fill on or after 05/01/23   methadone (DOLOPHINE) 10 MG tablet    Sig: Take 2 tablets (20 mg total) by mouth 2 (two) times daily as needed for severe pain.    Dispense:  120 tablet    Refill:  0    Please fill on or after 05/31/23   methadone (DOLOPHINE) 10 MG tablet    Sig: Take 2 tablets (20 mg total) by mouth 2 (two) times daily as needed for severe pain.    Dispense:  120 tablet    Refill:  0    Please fill on or after 06/30/23      Follow-up: Return in about 3 months (around 07/04/2023).  Sonda Primes, MD

## 2023-04-04 NOTE — Assessment & Plan Note (Signed)
Treat DM. Cont on Glimepiride Gabriel Erickson wants to stay on it), Lantus - start to titrate up. Pt declined CGM

## 2023-04-06 ENCOUNTER — Ambulatory Visit (INDEPENDENT_AMBULATORY_CARE_PROVIDER_SITE_OTHER): Payer: Medicare PPO

## 2023-04-06 VITALS — Ht 72.0 in | Wt 167.0 lb

## 2023-04-06 DIAGNOSIS — Z0000 Encounter for general adult medical examination without abnormal findings: Secondary | ICD-10-CM | POA: Diagnosis not present

## 2023-04-06 NOTE — Progress Notes (Cosign Needed Addendum)
Subjective:   Gabriel Erickson is a 82 y.o. male who presents for Medicare Annual/Subsequent preventive examination.  Visit Complete: Virtual  I connected with  Gabriel Erickson on 04/06/23 by a audio enabled telemedicine application and verified that I am speaking with the correct person using two identifiers.  Patient Location: Home  Provider Location: Office/Clinic  I discussed the limitations of evaluation and management by telemedicine. The patient expressed understanding and agreed to proceed.  Patient provided his/her weight during this virtual phone visit.  Vital Signs: Because this visit was a virtual/telehealth visit, some criteria may be missing or patient reported. Any vitals not documented were not able to be obtained and vitals that have been documented are patient reported.    Cardiac Risk Factors include: advanced age (>63men, >13 women);diabetes mellitus;family history of premature cardiovascular disease;male gender     Objective:    Today's Vitals   04/06/23 1531  Weight: 167 lb (75.8 kg)  Height: 6' (1.829 m)  PainSc: 0-No pain   Body mass index is 22.65 kg/m.     04/06/2023    3:38 PM 02/25/2022    3:00 PM 05/28/2018   10:45 AM 11/23/2017   10:43 AM 07/26/2017    8:00 AM 07/14/2017   12:59 PM 11/21/2016   11:52 AM  Advanced Directives  Does Patient Have a Medical Advance Directive? Yes Yes Yes Yes Yes;No Yes Yes  Type of Estate agent of [place];Living will Healthcare Power of [place];Living will Healthcare Power of [place];Living will Healthcare Power of [place];Living will  Healthcare Power of [employer] Power of Attorney  Does patient want to make changes to medical advance directive?   No - Patient declined      Copy of Healthcare Power of Attorney in Chart? No - copy requested  No - copy requested No - copy requested   No - copy requested    Current Medications (verified) Outpatient Encounter Medications as of 04/06/2023   Medication Sig   Alcohol Swabs (DROPSAFE ALCOHOL PREP) 70 % PADS USE TO CLEAN FINGER BEFORE CHECKING BLOOD SUGAR LEVEL TWO TIMES DAILY   b complex vitamins tablet Take 1 tablet by mouth daily.   Blood Glucose Calibration (TRUE METRIX LEVEL 2) Normal SOLN Use to calibrate meter   Blood Glucose Monitoring Suppl (TRUE METRIX AIR GLUCOSE METER) w/Device KIT Use as directed to check blood sugars   Blood Glucose Monitoring Suppl (TRUE METRIX METER) w/Device KIT Use to check blood sugar BID   Cholecalciferol 1000 UNITS tablet Take 1,000 Units by mouth daily.   diazepam (VALIUM) 5 MG tablet TAKE ONE TABLET TWICE DAILY AS NEEDED FOR muscle spasms, cramps, or insomnia   DROPLET PEN NEEDLES 32G X 4 MM MISC USE AS DIRECTED   famotidine (PEPCID) 40 MG tablet Take 1 tablet (40 mg total) by mouth daily.   fluticasone (FLONASE) 50 MCG/ACT nasal spray Place 2 sprays into the nose daily.   glimepiride (AMARYL) 4 MG tablet Take 1 tablet (4 mg total) by mouth 2 (two) times daily.   glucose blood (TRUE METRIX BLOOD GLUCOSE TEST) test strip TEST BLOOD SUGAR TWICE DAILY   insulin glargine (LANTUS SOLOSTAR) 100 UNIT/ML Solostar Pen Inject 10 Units into the skin daily. Titrate up by 1 unit every 1-2 days for goal sugar  (CBG) 100-130   loratadine (CLARITIN) 10 MG tablet Take 1 tablet (10 mg total) by mouth daily.   methadone (DOLOPHINE) 10 MG tablet Take 2 tablets (20 mg total) by mouth  2 (two) times daily as needed for severe pain. For pain M54.5   methadone (DOLOPHINE) 10 MG tablet Take 2 tablets (20 mg total) by mouth 2 (two) times daily as needed for severe pain.   methadone (DOLOPHINE) 10 MG tablet Take 2 tablets (20 mg total) by mouth 2 (two) times daily as needed for severe pain.   naloxone (NARCAN) nasal spray 4 mg/0.1 mL 1 actuation in one nostril once. May repeat in 2-3 min   RABEprazole (ACIPHEX) 20 MG tablet Take 1 tablet (20 mg total) by mouth daily.   tadalafil (CIALIS) 20 MG tablet Take 0.5-1 tablets  (10-20 mg total) by mouth daily as needed for erectile dysfunction.   TRUEplus Lancets 30G MISC Use to check blood sugar BID   No facility-administered encounter medications on file as of 04/06/2023.    Allergies (verified) Penicillins, Ciprofloxacin, Metformin and related, Rosuvastatin, Actos [pioglitazone], Jardiance [empagliflozin], and Influenza vaccines   History: Past Medical History:  Diagnosis Date   Allergy    Cancer (HCC)    COLON CANCER   Cataract    surgery - left eye only   Colon cancer, ascending (HCC) 06/24/2015   Constipation    Diabetes mellitus    Diverticulosis of colon    Fatty liver    Gallstones    Possible vs sludge   GERD (gastroesophageal reflux disease)    Hypogonadism male    Hypotension    LBP (low back pain)    Osteoarthritis    lower back   Peripheral neuropathy    occasional   Seasonal allergies    Urinary frequency    Past Surgical History:  Procedure Laterality Date   APPENDECTOMY     CATARACT EXTRACTION Left    COLON SURGERY     COLONOSCOPY  04/2015   [facility]   EYE SURGERY Left    FOOT SURGERY  1966   Left   HEMORRHOID SURGERY  2011   [facility]   LAPAROSCOPIC PARTIAL COLECTOMY N/A 06/24/2015   Procedure: LAPAROSCOPIC PARTIAL COLECTOMY;  Surgeon: Harriette Bouillon, MD;  Location: [facility] OR;  Service: General;  Laterality: N/A;   LUMBAR LAMINECTOMY     PARTIAL COLECTOMY  06/24/2015   POLYPECTOMY     TONSILLECTOMY     Family History  Problem Relation Age of Onset   Heart disease Mother    Cancer Sister        Liver, biliary duct & tube   Hemochromatosis Sister    Stroke Sister    Colon polyps Sister    Colon cancer Paternal Uncle    Esophageal cancer Neg Hx    Stomach cancer Neg Hx    Rectal cancer Neg Hx    Social History   Socioeconomic History   Marital status: Married    Spouse name: Not on file   Number of children: Not on file   Years of education: Not on file   Highest education level: Not on file  Occupational  History   Occupation: Retired    Associate Professor: RETIRED  Tobacco Use   Smoking status: Never   Smokeless tobacco: Never  Vaping Use   Vaping status: Never Used  Substance and Sexual Activity   Alcohol use: Yes    Alcohol/week: 0.0 standard drinks of alcohol    Comment: rarely wine   Drug use: No   Sexual activity: Yes  Other Topics Concern   Not on file  Social History Narrative   Family history diabetes 1st degree relative   Family  history hypertension   Family history of colon cancer: uncle         Caring for ill mother at home and a sister w/brain injury at a NH      Married, wife Clydie Braun for 20+ years   Retired as Psychologist, 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   Enjoys outdoors-fishing   Has no biological children, but # 3 step children   Has a cat, that "rules the home"-indoor cat (has claws)         Social Determinants of Health   Financial Resource Strain: Low Risk  (04/06/2023)   Overall Financial Resource Strain (CARDIA)    Difficulty of Paying Living Expenses: Not hard at all  Food Insecurity: No Food Insecurity (04/06/2023)   Hunger Vital Sign    Worried About Running Out of Food in the Last Year: Never true    Ran Out of Food in the Last Year: Never true  Transportation Needs: No Transportation Needs (04/06/2023)   PRAPARE - Administrator, Civil Service (Medical): No    Lack of Transportation (Non-Medical): No  Physical Activity: Sufficiently Active (04/06/2023)   Exercise Vital Sign    Days of Exercise per Week: 7 days    Minutes of Exercise per Session: 50 min  Stress: No Stress Concern Present (04/06/2023)   [employer] of Occupational Health - Occupational Stress Questionnaire    Feeling of Stress : Not at all  Social Connections: Socially Integrated (04/06/2023)   Social Connection and Isolation Panel [NHANES]    Frequency of Communication with Friends and Family: Three times a week    Frequency of Social Gatherings with Friends and  Family: Three times a week    Attends Religious Services: More than 4 times per year    Active Member of Clubs or Organizations: Yes    Attends Engineer, structural: More than 4 times per year    Marital Status: Married    Tobacco Counseling Counseling given: Not Answered   Clinical Intake:  Pre-visit preparation completed: Yes  Pain : No/denies pain Pain Score: 0-No pain     BMI - recorded: 22.65 Nutritional Status: BMI of 19-24  Normal Nutritional Risks: None Diabetes: Yes CBG done?: Yes (122 FASTING) CBG resulted in Enter/ Edit results?: No Did pt. bring in CBG monitor from home?: No  How often do you need to have someone help you when you read instructions, pamphlets, or other written materials from your doctor or pharmacy?: 1 - Never What is the last grade level you completed in school?: HSG; AIRFORCE (2 YEARS OF COURSES: [employer])  Interpreter Needed?: No  Information entered by :: Sophiea Ueda N. Sharnise Blough, LPN.   Activities of Daily Living    04/06/2023    3:43 PM  In your present state of health, do you have any difficulty performing the following activities:  Hearing? 0  Vision? 0  Difficulty concentrating or making decisions? 0  Walking or climbing stairs? 0  Dressing or bathing? 0  Doing errands, shopping? 0  Preparing Food and eating ? N  Using the Toilet? N  In the past six months, have you accidently leaked urine? N  Do you have problems with loss of bowel control? N  Managing your Medications? N  Managing your Finances? N  Housekeeping or managing your Housekeeping? N    Patient Care Team: Plotnikov, Georgina Quint, MD as PCP - General Harriette Bouillon, MD as Consulting Physician (General Surgery) Ileene Patrick  P, MD as Consulting Physician (Gastroenterology) Albin Felling, OD as Consulting Physician (Optometry)  Indicate any recent Medical Services you may have received from other than [facility] providers in the past year (date  may be approximate).     Assessment:   This is a routine wellness examination for Mahari.  Hearing/Vision screen Hearing Screening - Comments:: Patient denied any hearing difficulty.   No hearing aids.  Vision Screening - Comments:: Patient does not wear any corrective lenses/contacts.   Cataracts removed and laser surgery done by [facility] exam done by: Coralyn Pear, OD.    Goals Addressed             This Visit's Progress    Client understands the importance of follow-up with providers by attending scheduled visits        Depression Screen    04/06/2023    3:45 PM 04/04/2023    1:19 PM 01/03/2023    1:20 PM 10/03/2022    1:25 PM 07/07/2022    1:41 PM 02/25/2022    2:57 PM 02/25/2022    2:56 PM  PHQ 2/9 Scores  PHQ - 2 Score 0 0 0 0 0 0 0  PHQ- 9 Score 0    0      Fall Risk    04/06/2023    3:41 PM 04/04/2023    1:19 PM 01/03/2023    1:19 PM 10/03/2022    1:24 PM 07/07/2022    1:40 PM  Fall Risk   Falls in the past year? 0 0 0 0 0  Number falls in past yr: 0 0 0 0 0  Injury with Fall? 0 0 0 0 0  Risk for fall due to : No Fall Risks No Fall Risks  Impaired balance/gait;History of fall(s) No Fall Risks  Follow up Falls prevention discussed Falls evaluation completed Falls evaluation completed Falls evaluation completed Falls evaluation completed    MEDICARE RISK AT HOME: Medicare Risk at Home Any stairs in or around the home?: Yes (to basement) If so, are there any without handrails?: No Home free of loose throw rugs in walkways, pet beds, electrical cords, etc?: No Adequate lighting in your home to reduce risk of falls?: No Life alert?: No Use of a cane, walker or w/c?: No Grab bars in the bathroom?: Yes Shower chair or bench in shower?: Yes Elevated toilet seat or a handicapped toilet?: Yes  TIMED UP AND GO:  Was the test performed?  No    Cognitive Function:        04/06/2023    3:41 PM 02/25/2022    2:59 PM  6CIT Screen  What Year?  0 points 0 points  What month? 0 points 0 points  What time? 0 points 0 points  Count back from 20 0 points 0 points  Months in reverse 0 points 0 points  Repeat phrase 0 points 0 points  Total Score 0 points 0 points    Immunizations Immunization History  Administered Date(s) Administered   PFIZER(Purple Top)SARS-COV-2 Vaccination 09/10/2019, 06/26/2020   PNEUMOCOCCAL CONJUGATE-20 01/05/2021   Pneumococcal Conjugate-13 08/26/2014   Pneumococcal Polysaccharide-23 04/12/2010   Td 05/26/2014    TDAP status: Up to date  Flu Vaccine status: Declined, Education has been provided regarding the importance of this vaccine but patient still declined. Advised may receive this vaccine at local pharmacy or Health Dept. Aware to provide a copy of the vaccination record if obtained from local pharmacy or Health Dept. Verbalized acceptance  and understanding.  Pneumococcal vaccine status: Up to date  Covid-19 vaccine status: Completed vaccines  Qualifies for Shingles Vaccine? Yes   Zostavax completed No   Shingrix Completed?: No.    Education has been provided regarding the importance of this vaccine. Patient has been advised to call insurance company to determine out of pocket expense if they have not yet received this vaccine. Advised may also receive vaccine at local pharmacy or Health Dept. Verbalized acceptance and understanding.  Screening Tests Health Maintenance  Topic Date Due   FOOT EXAM  05/08/2012   Colonoscopy  07/26/2020   OPHTHALMOLOGY EXAM  06/04/2022   HEMOGLOBIN A1C  09/25/2023   Diabetic kidney evaluation - Urine ACR  10/03/2023   Diabetic kidney evaluation - eGFR measurement  03/27/2024   Medicare Annual Wellness (AWV)  04/05/2024   DTaP/Tdap/Td (2 - Tdap) 05/26/2024   Pneumonia Vaccine 30+ Years old  Completed   Hepatitis C Screening  Completed   HPV VACCINES  Aged Out   COVID-19 Vaccine  Discontinued   Zoster Vaccines- Shingrix  Discontinued    Health  Maintenance  Health Maintenance Due  Topic Date Due   FOOT EXAM  05/08/2012   Colonoscopy  07/26/2020   OPHTHALMOLOGY EXAM  06/04/2022    Colorectal cancer screening: No longer required.   Lung Cancer Screening: (Low Dose CT Chest recommended if Age 51-80 years, 20 pack-year currently smoking OR have quit w/in 15years.) does not qualify.   Lung Cancer Screening Referral: no  Additional Screening:  Hepatitis C Screening: does qualify; Completed 09/23/2022  Vision Screening: Recommended annual ophthalmology exams for early detection of glaucoma and other disorders of the eye. Is the patient up to date with their annual eye exam?  Yes  Who is the provider or what is the name of the office in which the patient attends annual eye exams? Ryan Snipes, OD. If pt is not established with a provider, would they like to be referred to a provider to establish care? No .   Dental Screening: Recommended annual dental exams for proper oral hygiene  Diabetic Foot Exam: Diabetic Foot Exam: Completed 9/172024  Community Resource Referral / Chronic Care Management: CRR required this visit?  No   CCM required this visit?  No     Plan:     I have personally reviewed and noted the following in the patient's chart:   Medical and social history Use of alcohol, tobacco or illicit drugs  Current medications and supplements including opioid prescriptions. Patient is currently taking opioid prescriptions. Information provided to patient regarding non-opioid alternatives. Patient advised to discuss non-opioid treatment plan with their provider. Functional ability and status Nutritional status Physical activity Advanced directives List of other physicians Hospitalizations, surgeries, and ER visits in previous 12 months Vitals Screenings to include cognitive, depression, and falls Referrals and appointments  In addition, I have reviewed and discussed with patient certain preventive protocols,  quality metrics, and best practice recommendations. A written personalized care plan for preventive services as well as general preventive health recommendations were provided to patient.     Mickeal Needy, LPN   1/61/0960   After Visit Summary: (Mail) Due to this being a telephonic visit, the after visit summary with patients personalized plan was offered to patient via mail   Nurse Notes: Normal cognitive status assessed by direct observation via telephone conversation by this Nurse Health Advisor. No abnormalities found.  Medical screening examination/treatment/procedure(s) were performed by non-physician practitioner and as supervising physician I  was immediately available for consultation/collaboration.  I agree with above. Jacinta Shoe, MD

## 2023-04-06 NOTE — Patient Instructions (Addendum)
Mr. Tibbals , Thank you for taking time to come for your Medicare Wellness Visit. I appreciate your ongoing commitment to your health goals. Please review the following plan we discussed and let me know if I can assist you in the future.   Referrals/Orders/Follow-Ups/Clinician Recommendations: No  This is a list of the screening recommended for you and due dates:  Health Maintenance  Topic Date Due   Complete foot exam   05/08/2012   Colon Cancer Screening  07/26/2020   Eye exam for diabetics  06/04/2022   Hemoglobin A1C  09/25/2023   Yearly kidney health urinalysis for diabetes  10/03/2023   Yearly kidney function blood test for diabetes  03/27/2024   Medicare Annual Wellness Visit  04/05/2024   DTaP/Tdap/Td vaccine (2 - Tdap) 05/26/2024   Pneumonia Vaccine  Completed   Hepatitis C Screening  Completed   HPV Vaccine  Aged Out   COVID-19 Vaccine  Discontinued   Zoster (Shingles) Vaccine  Discontinued    Advanced directives: (Copy Requested) Please bring a copy of your health care power of attorney and living will to the office to be added to your chart at your convenience.  Next Medicare Annual Wellness Visit scheduled for next year: Yes  *insert Preventive Care attachment *Insert FALL PREVENTION attachment

## 2023-06-02 DIAGNOSIS — J069 Acute upper respiratory infection, unspecified: Secondary | ICD-10-CM | POA: Diagnosis not present

## 2023-06-02 DIAGNOSIS — I10 Essential (primary) hypertension: Secondary | ICD-10-CM | POA: Diagnosis not present

## 2023-06-02 DIAGNOSIS — R070 Pain in throat: Secondary | ICD-10-CM | POA: Diagnosis not present

## 2023-06-02 DIAGNOSIS — R051 Acute cough: Secondary | ICD-10-CM | POA: Diagnosis not present

## 2023-06-02 DIAGNOSIS — J988 Other specified respiratory disorders: Secondary | ICD-10-CM | POA: Diagnosis not present

## 2023-07-03 ENCOUNTER — Ambulatory Visit: Payer: Medicare PPO | Admitting: Internal Medicine

## 2023-07-03 ENCOUNTER — Encounter: Payer: Self-pay | Admitting: Internal Medicine

## 2023-07-03 VITALS — BP 110/68 | HR 91 | Temp 98.0°F | Ht 72.0 in | Wt 166.0 lb

## 2023-07-03 DIAGNOSIS — Z7985 Long-term (current) use of injectable non-insulin antidiabetic drugs: Secondary | ICD-10-CM

## 2023-07-03 DIAGNOSIS — G8929 Other chronic pain: Secondary | ICD-10-CM

## 2023-07-03 DIAGNOSIS — Z7984 Long term (current) use of oral hypoglycemic drugs: Secondary | ICD-10-CM

## 2023-07-03 DIAGNOSIS — K760 Fatty (change of) liver, not elsewhere classified: Secondary | ICD-10-CM

## 2023-07-03 DIAGNOSIS — K219 Gastro-esophageal reflux disease without esophagitis: Secondary | ICD-10-CM

## 2023-07-03 DIAGNOSIS — M5440 Lumbago with sciatica, unspecified side: Secondary | ICD-10-CM

## 2023-07-03 DIAGNOSIS — E1142 Type 2 diabetes mellitus with diabetic polyneuropathy: Secondary | ICD-10-CM

## 2023-07-03 DIAGNOSIS — G609 Hereditary and idiopathic neuropathy, unspecified: Secondary | ICD-10-CM

## 2023-07-03 LAB — COMPREHENSIVE METABOLIC PANEL
ALT: 24 U/L (ref 0–53)
AST: 26 U/L (ref 0–37)
Albumin: 4.3 g/dL (ref 3.5–5.2)
Alkaline Phosphatase: 80 U/L (ref 39–117)
BUN: 21 mg/dL (ref 6–23)
CO2: 32 meq/L (ref 19–32)
Calcium: 9.4 mg/dL (ref 8.4–10.5)
Chloride: 100 meq/L (ref 96–112)
Creatinine, Ser: 0.78 mg/dL (ref 0.40–1.50)
GFR: 84.4 mL/min (ref >60.00)
Glucose, Bld: 130 mg/dL — ABNORMAL HIGH (ref 70–99)
Potassium: 4.3 meq/L (ref 3.5–5.1)
Sodium: 139 meq/L (ref 135–145)
Total Bilirubin: 0.6 mg/dL (ref 0.2–1.2)
Total Protein: 7.1 g/dL (ref 6.0–8.3)

## 2023-07-03 LAB — HEMOGLOBIN A1C: Hgb A1c MFr Bld: 8.7 % — ABNORMAL HIGH (ref 4.6–6.5)

## 2023-07-03 LAB — MICROALBUMIN / CREATININE URINE RATIO
Creatinine,U: 157.2 mg/dL
Microalb Creat Ratio: 1.6 mg/g (ref 0.0–30.0)
Microalb, Ur: 2.6 mg/dL — ABNORMAL HIGH (ref 0.0–1.9)

## 2023-07-03 LAB — PSA: PSA: 3.45 ng/mL (ref 0.10–4.00)

## 2023-07-03 LAB — TSH: TSH: 0.69 u[IU]/mL (ref 0.35–5.50)

## 2023-07-03 MED ORDER — METHADONE HCL 10 MG PO TABS: 20.0000 mg | ORAL_TABLET | Freq: Two times a day (BID) | ORAL | 0 refills | Status: DC | PRN

## 2023-07-03 NOTE — Assessment & Plan Note (Signed)
On Aciphex po

## 2023-07-03 NOTE — Assessment & Plan Note (Signed)
Keep Hct <45% 

## 2023-07-03 NOTE — Assessment & Plan Note (Signed)
Continue with current prescription therapy as reflected on the Med list.  

## 2023-07-03 NOTE — Assessment & Plan Note (Signed)
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 addiction risk, apnea etc) and complications (i.e. Somnolence, constipation and others) were explained to the patient and were aknowledged. 3/20 - reducing the dose per Gabriel Erickson's request w/caution Diazepam Benzo use risk is discussed - taking very little Narcane option

## 2023-07-03 NOTE — Progress Notes (Signed)
Subjective:  Patient ID: Gabriel Erickson, male    DOB: 1941/05/05  Age: 82 y.o. MRN: [id number]  CC: Medical Management of Chronic Issues (3 mnth f/u)   HPI Gabriel Erickson presents for LBP, DM, HTN  Outpatient Medications Prior to Visit  Medication Sig Dispense Refill   Alcohol Swabs (DROPSAFE ALCOHOL PREP) 70 % PADS USE TO CLEAN FINGER BEFORE CHECKING BLOOD SUGAR LEVEL TWO TIMES DAILY 200 each 3   b complex vitamins tablet Take 1 tablet by mouth daily.     Blood Glucose Calibration (TRUE METRIX LEVEL 2) Normal SOLN Use to calibrate meter 1 each 3   Blood Glucose Monitoring Suppl (TRUE METRIX AIR GLUCOSE METER) w/Device KIT Use as directed to check blood sugars 1 kit 0   Blood Glucose Monitoring Suppl (TRUE METRIX METER) w/Device KIT Use to check blood sugar BID 1 kit 0   Cholecalciferol 1000 UNITS tablet Take 1,000 Units by mouth daily.     diazepam (VALIUM) 5 MG tablet TAKE ONE TABLET TWICE DAILY AS NEEDED FOR muscle spasms, cramps, or insomnia 60 tablet 2   DROPLET PEN NEEDLES 32G X 4 MM MISC USE AS DIRECTED 300 each 3   famotidine (PEPCID) 40 MG tablet Take 1 tablet (40 mg total) by mouth daily. 90 tablet 3   fluticasone (FLONASE) 50 MCG/ACT nasal spray Place 2 sprays into the nose daily. 16 g 11   glimepiride (AMARYL) 4 MG tablet Take 1 tablet (4 mg total) by mouth 2 (two) times daily. 180 tablet 3   glucose blood (TRUE METRIX BLOOD GLUCOSE TEST) test strip TEST BLOOD SUGAR TWICE DAILY 200 strip 3   insulin glargine (LANTUS SOLOSTAR) 100 UNIT/ML Solostar Pen Inject 10 Units into the skin daily. Titrate up by 1 unit every 1-2 days for goal sugar  (CBG) 100-130 15 mL 11   loratadine (CLARITIN) 10 MG tablet Take 1 tablet (10 mg total) by mouth daily. 90 tablet 3   naloxone (NARCAN) nasal spray 4 mg/0.1 mL 1 actuation in one nostril once. May repeat in 2-3 min 1 each 2   RABEprazole (ACIPHEX) 20 MG tablet Take 1 tablet (20 mg total) by mouth daily. 90 tablet 3   tadalafil (CIALIS) 20 MG tablet  Take 0.5-1 tablets (10-20 mg total) by mouth daily as needed for erectile dysfunction. 30 tablet 3   TRUEplus Lancets 30G MISC Use to check blood sugar BID 200 each 5   methadone (DOLOPHINE) 10 MG tablet Take 2 tablets (20 mg total) by mouth 2 (two) times daily as needed for severe pain. For pain M54.5 120 tablet 0   methadone (DOLOPHINE) 10 MG tablet Take 2 tablets (20 mg total) by mouth 2 (two) times daily as needed for severe pain. 120 tablet 0   methadone (DOLOPHINE) 10 MG tablet Take 2 tablets (20 mg total) by mouth 2 (two) times daily as needed for severe pain. 120 tablet 0   No facility-administered medications prior to visit.    ROS: Review of Systems  Constitutional:  Positive for fatigue. Negative for appetite change and unexpected weight change.  HENT:  Negative for congestion, nosebleeds, sneezing, sore throat and trouble swallowing.   Eyes:  Negative for itching and visual disturbance.  Respiratory:  Negative for cough.   Cardiovascular:  Negative for chest pain, palpitations and leg swelling.  Gastrointestinal:  Negative for abdominal distention, blood in stool, diarrhea and nausea.  Genitourinary:  Negative for frequency and hematuria.  Musculoskeletal:  Positive for back pain  and gait problem. Negative for joint swelling and neck pain.  Skin:  Negative for rash.  Neurological:  Negative for dizziness, tremors, speech difficulty and weakness.  Psychiatric/Behavioral:  Negative for agitation, dysphoric mood and sleep disturbance. The patient is not nervous/anxious.     Objective:  BP 110/68 (BP Location: Left Arm, Patient Position: Sitting, Cuff Size: Normal)   Pulse 91   Temp 98 F (36.7 C) (Oral)   Ht 6' (1.829 m)   Wt 166 lb (75.3 kg)   SpO2 94%   BMI 22.51 kg/m   BP Readings from Last 3 Encounters:  07/03/23 110/68  04/04/23 122/82  01/03/23 118/82    Wt Readings from Last 3 Encounters:  07/03/23 166 lb (75.3 kg)  04/06/23 167 lb (75.8 kg)  04/04/23 167  lb 3.2 oz (75.8 kg)    Physical Exam Constitutional:      General: He is not in acute distress.    Appearance: He is well-developed.     Comments: NAD  Eyes:     Conjunctiva/sclera: Conjunctivae normal.     Pupils: Pupils are equal, round, and reactive to light.  Neck:     Thyroid: No thyromegaly.     Vascular: No JVD.  Cardiovascular:     Rate and Rhythm: Normal rate and regular rhythm.     Heart sounds: Normal heart sounds. No murmur heard.    No friction rub. No gallop.  Pulmonary:     Effort: Pulmonary effort is normal. No respiratory distress.     Breath sounds: Normal breath sounds. No wheezing or rales.  Chest:     Chest wall: No tenderness.  Abdominal:     General: Bowel sounds are normal. There is no distension.     Palpations: Abdomen is soft. There is no mass.     Tenderness: There is no abdominal tenderness. There is no guarding or rebound.  Musculoskeletal:        General: Tenderness present. Normal range of motion.     Cervical back: Normal range of motion.     Right lower leg: No edema.     Left lower leg: No edema.  Lymphadenopathy:     Cervical: No cervical adenopathy.  Skin:    General: Skin is warm and dry.     Findings: No rash.  Neurological:     Mental Status: He is alert and oriented to person, place, and time.     Cranial Nerves: No cranial nerve deficit.     Motor: No abnormal muscle tone.     Coordination: Coordination abnormal.     Gait: Gait abnormal.     Deep Tendon Reflexes: Reflexes are normal and symmetric.  Psychiatric:        Behavior: Behavior normal.        Thought Content: Thought content normal.        Judgment: Judgment normal.   LS w/pain Legs w/weakness  Lab Results  Component Value Date   WBC 7.0 01/03/2023   HGB 14.6 01/03/2023   HCT 44.5 01/03/2023   PLT 252.0 01/03/2023   GLUCOSE 117 (H) 03/28/2023   CHOL 190 07/07/2022   TRIG 149.0 07/07/2022   HDL 38.80 (L) 07/07/2022   LDLDIRECT 142.3 04/12/2010   LDLCALC  121 (H) 07/07/2022   ALT 22 03/28/2023   AST 25 03/28/2023   NA 138 03/28/2023   K 4.0 03/28/2023   CL 101 03/28/2023   CREATININE 0.79 03/28/2023   BUN 15 03/28/2023   CO2 32  03/28/2023   TSH 1.03 07/07/2022   PSA 2.32 07/07/2021   HGBA1C 8.5 (H) 03/28/2023   MICROALBUR 1.3 10/03/2022    US THYROID Result Date: 08/11/2022 CLINICAL DATA:  Prior ultrasound follow-up. TI-RADS category 3 nodule in the right mid gland currently under imaging surveillance. EXAM: THYROID ULTRASOUND TECHNIQUE: Ultrasound examination of the thyroid gland and adjacent soft tissues was performed. COMPARISON:  Prior thyroid ultrasound 08/04/2021 FINDINGS: Parenchymal Echotexture: Markedly heterogenous Isthmus: 0.3 cm Right lobe: 4.7 x 2.3 x 2.6 cm Left lobe: 5.2 x 2.8 x 2.0 cm _________________________________________________________ Estimated total number of nodules >/= 1 cm: 2 Number of spongiform nodules >/=  2 cm not described below (TR1): 0 Number of mixed cystic and solid nodules >/= 1.5 cm not described below (TR2): 0 _________________________________________________________ Nodule # 1: Small isoechoic nodule in the right upper gland measures only 1.2 cm. This does not meet criteria for further evaluation. Nodule # 2: Vague region of pseudo nodularity in the right inferior gland. This lesion is not clearly defined from the background thyroid parenchyma and is not considered a true thyroid nodule. Nodule # 3: Continued stability of isoechoic solid nodule (TI-RADS category 3) which is very ill-defined in the left mid gland at 2.2 x 1.7 x 1.7 cm compared to 2.2 x 2.0 x 1.7 cm previously. *Given size (>/= 1.5 - 2.4 cm) and appearance, a follow-up ultrasound in 1 year should be considered based on TI-RADS criteria. No new nodules or suspicious features. IMPRESSION: 1. Confirmed 1 year stability of TI-RADS category 3 nodule in the left mid gland. Recommend continued follow-up ultrasound every 1-2 years until 5 year stability  is confirmed. 2. Diffusely enlarged and heterogeneous thyroid gland. 3. No new nodules or suspicious features. The above is in keeping with the ACR TI-RADS recommendations - J Am Coll Radiol 2017;14:587-595. Electronically Signed   By: Malachy Moan M.D.   On: 08/11/2022 09:07    Assessment & Plan:   Problem List Items Addressed This Visit     Diabetes mellitus type 2, controlled (HCC) - Primary   Cont on Glimepiride Gabriel Erickson wants to stay on it), Lantus - start to titrate up. Pt declined CGM      Hemochromatosis   Keep Hct <45%      Hereditary and idiopathic peripheral neuropathy   Continue with current prescription therapy as reflected on the Med list.       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 addiction risk, apnea etc) and complications (i.e. Somnolence, constipation and others) were explained to the patient and were aknowledged. 3/20 - reducing the dose per Gabriel Erickson's request w/caution Diazepam Benzo use risk is discussed - taking very little Narcane option       Relevant Medications   methadone (DOLOPHINE) 10 MG tablet   methadone (DOLOPHINE) 10 MG tablet   methadone (DOLOPHINE) 10 MG tablet      Meds ordered this encounter  Medications   methadone (DOLOPHINE) 10 MG tablet    Sig: Take 2 tablets (20 mg total) by mouth 2 (two) times daily as needed for severe pain (pain score 7-10). For pain M54.5    Dispense:  120 tablet    Refill:  0    Please fill on or after 08/28/22   methadone (DOLOPHINE) 10 MG tablet    Sig: Take 2 tablets (20 mg total) by  mouth 2 (two) times daily as needed for severe pain (pain score 7-10).    Dispense:  120 tablet    Refill:  0    Please fill on or after 09/28/23   methadone (DOLOPHINE) 10 MG tablet    Sig: Take 2 tablets (20 mg total) by mouth 2 (two) times daily as needed for severe pain (pain score 7-10).     Dispense:  120 tablet    Refill:  0    Please fill on or after 07/30/23      Follow-up: Return in about 3 months (around 10/01/2023) for a follow-up visit.  Sonda Primes, MD

## 2023-07-03 NOTE — Assessment & Plan Note (Signed)
Cont on Glimepiride Stipes wants to stay on it), Lantus - start to titrate up. Pt declined CGM

## 2023-07-04 LAB — PMP SCREEN PROFILE (10S), URINE
Amphetamine Scrn, Ur: NEGATIVE ng/mL
BARBITURATE SCREEN URINE: NEGATIVE ng/mL
BENZODIAZEPINE SCREEN, URINE: NEGATIVE ng/mL
CANNABINOIDS UR QL SCN: NEGATIVE ng/mL
Cocaine (Metab) Scrn, Ur: NEGATIVE ng/mL
Creatinine(Crt), U: 159 mg/dL (ref 20.0–300.0)
Methadone Screen, Urine: POSITIVE ng/mL — AB
OXYCODONE+OXYMORPHONE UR QL SCN: NEGATIVE ng/mL
Opiate Scrn, Ur: NEGATIVE ng/mL
Ph of Urine: 5.5 (ref 4.5–8.9)
Phencyclidine Qn, Ur: NEGATIVE ng/mL
Propoxyphene Scrn, Ur: NEGATIVE ng/mL

## 2023-07-04 LAB — AFP TUMOR MARKER: AFP-Tumor Marker: 2.2 ng/mL (ref <6.1)

## 2023-08-03 ENCOUNTER — Other Ambulatory Visit: Payer: Self-pay | Admitting: Internal Medicine

## 2023-08-03 ENCOUNTER — Other Ambulatory Visit: Payer: Self-pay

## 2023-08-03 MED ORDER — LANTUS SOLOSTAR 100 UNIT/ML ~~LOC~~ SOPN: 10.0000 [IU] | PEN_INJECTOR | Freq: Every day | SUBCUTANEOUS | 11 refills | Status: DC

## 2023-08-03 NOTE — Telephone Encounter (Signed)
Copied from CRM [id number]. Topic: Clinical - Medication Refill >> Aug 03, 2023 12:44 PM Gurney Maxin H wrote: Most Recent Primary Care Visit:  Provider: Tresa Garter  Department: [facility]  Visit Type: OFFICE VISIT  Date: 07/03/2023  Medication: insulin glargine (LANTUS SOLOSTAR) 100 UNIT/ML Solostar Pen  Has the patient contacted their pharmacy? Yes, waiting on provider (Agent: If no, request that the patient contact the pharmacy for the refill. If patient does not wish to contact the pharmacy document the reason why and proceed with request.) (Agent: If yes, when and what did the pharmacy advise?)  Is this the correct pharmacy for this prescription? Yes If no, delete pharmacy and type the correct one.  This is the patient's preferred pharmacy:   [facility] - [place], [place] - [address] [place] [address] Phone: [phone] Fax: [phone]   Has the prescription been filled recently? No  Is the patient out of the medication? No  Has the patient been seen for an appointment in the last year OR does the patient have an upcoming appointment? Yes  Can we respond through MyChart? No  Agent: Please be advised that Rx refills may take up to 3 business days. We ask that you follow-up with your pharmacy.

## 2023-08-03 NOTE — Telephone Encounter (Signed)
Copied from CRM [id number]. Topic: Clinical - Medication Refill >> Aug 03, 2023 12:44 PM Gurney Maxin H wrote: Most Recent Primary Care Visit:  Provider: Tresa Garter  Department: [facility]  Visit Type: OFFICE VISIT  Date: 07/03/2023  Medication: insulin glargine (LANTUS SOLOSTAR) 100 UNIT/ML Solostar Pen  Has the patient contacted their pharmacy? Yes, waiting on provider (Agent: If no, request that the patient contact the pharmacy for the refill. If patient does not wish to contact the pharmacy document the reason why and proceed with request.) (Agent: If yes, when and what did the pharmacy advise?)  Is this the correct pharmacy for this prescription? Yes If no, delete pharmacy and type the correct one.  This is the patient's preferred pharmacy:   [facility] - [place], [place] - [address] [place] [address] Phone: [phone] Fax: [phone]   Has the prescription been filled recently? No  Is the patient out of the medication? No  Has the patient been seen for an appointment in the last year OR does the patient have an upcoming appointment?   Can we respond through MyChart?   Agent: Please be advised that Rx refills may take up to 3 business days. We ask that you follow-up with your pharmacy.

## 2023-08-04 NOTE — Telephone Encounter (Signed)
Copied from CRM [id number]. Topic: Clinical - Prescription Issue >> Aug 04, 2023  3:40 PM Gurney Maxin H wrote: Reason for CRM: Pharmacy is calling patient is at pharmacy to pickup prescription for insulin glargine (LANTUS SOLOSTAR) 100 UNIT/ML Solostar Pen but shows discontinued by provider.   Melissa [phone]

## 2023-08-04 NOTE — Telephone Encounter (Signed)
Copied from CRM [id number]. Topic: Clinical - Prescription Issue >> Aug 04, 2023 12:59 PM Elizebeth Brooking wrote: Reason for CRM: Patient called in regarding prescription insulin glargine (LANTUS SOLOSTAR) 100 UNIT/ML Solostar Pen refill, stated that the refill has been discontinued. He is requesting a callback in regards to this because he stated that he needs his insulin would like a callback

## 2023-08-04 NOTE — Telephone Encounter (Signed)
Change in pharmacy   Copied from CRM [id number]. Topic: Clinical - Medication Refill >> Aug 04, 2023  4:31 PM Rodman Pickle T wrote: Most Recent Primary Care Visit:  Provider: Tresa Garter  Department: [facility]  Visit Type: OFFICE VISIT  Date: 07/03/2023  Medication: insulin glargine (LANTUS SOLOSTAR) 100 UNIT/ML Solostar Pen  Has the patient contacted their pharmacy? Yes (Agent: If no, request that the patient contact the pharmacy for the refill. If patient does not wish to contact the pharmacy document the reason why and proceed with request.) (Agent: If yes, when and what did the pharmacy advise?)  Is this the correct pharmacy for this prescription? Yes If no, delete pharmacy and type the correct one.  This is the patient's preferred pharmacy:  [facility] - [place], [place] - [address] [place] [address] Phone: [phone] Fax: [phone]   Has the prescription been filled recently? No  Is the patient out of the medication? Yes  Has the patient been seen for an appointment in the last year OR does the patient have an upcoming appointment? Yes  Can we respond through MyChart? No  Agent: Please be advised that Rx refills may take up to 3 business days. We ask that you follow-up with your pharmacy.

## 2023-08-04 NOTE — Telephone Encounter (Signed)
Medication was sent to mail order pharmacy but patient prefers local pharmacy. Please send to local pharmacy. [facility] - [place], [place] - [address] [place] [address] Phone: [phone] Fax: [phone]

## 2023-08-07 ENCOUNTER — Telehealth: Payer: Self-pay | Admitting: Internal Medicine

## 2023-08-07 ENCOUNTER — Other Ambulatory Visit: Payer: Self-pay

## 2023-08-07 MED ORDER — LANTUS SOLOSTAR 100 UNIT/ML ~~LOC~~ SOPN: 10.0000 [IU] | PEN_INJECTOR | Freq: Every day | SUBCUTANEOUS | 11 refills | Status: DC

## 2023-08-07 NOTE — Telephone Encounter (Signed)
Rx has been sent to correct pharmacy. 

## 2023-08-07 NOTE — Telephone Encounter (Signed)
Copied from CRM [id number]. Topic: Clinical - Prescription Issue >> Aug 07, 2023  9:33 AM Corin V wrote: Reason for CRM: Inetta Fermo with [facility] calling regarding the [facility]. It was s3ent to a mail order pharmacy instead of them. Please resend to correct pharmacy:  [facility] - [place], [place] - [address] [place] [address] [place] [address] Phone: [phone] Fax: [phone]

## 2023-10-02 ENCOUNTER — Encounter: Payer: Self-pay | Admitting: Internal Medicine

## 2023-10-02 ENCOUNTER — Ambulatory Visit: Payer: Medicare PPO | Admitting: Internal Medicine

## 2023-10-02 VITALS — BP 122/80 | HR 99 | Temp 98.1°F | Ht 72.0 in | Wt 165.0 lb

## 2023-10-02 DIAGNOSIS — M5440 Lumbago with sciatica, unspecified side: Secondary | ICD-10-CM

## 2023-10-02 DIAGNOSIS — K760 Fatty (change of) liver, not elsewhere classified: Secondary | ICD-10-CM | POA: Diagnosis not present

## 2023-10-02 DIAGNOSIS — E1142 Type 2 diabetes mellitus with diabetic polyneuropathy: Secondary | ICD-10-CM | POA: Diagnosis not present

## 2023-10-02 DIAGNOSIS — Z85038 Personal history of other malignant neoplasm of large intestine: Secondary | ICD-10-CM | POA: Diagnosis not present

## 2023-10-02 DIAGNOSIS — G8929 Other chronic pain: Secondary | ICD-10-CM | POA: Diagnosis not present

## 2023-10-02 DIAGNOSIS — Z7984 Long term (current) use of oral hypoglycemic drugs: Secondary | ICD-10-CM | POA: Diagnosis not present

## 2023-10-02 DIAGNOSIS — R202 Paresthesia of skin: Secondary | ICD-10-CM

## 2023-10-02 LAB — COMPREHENSIVE METABOLIC PANEL
ALT: 20 U/L (ref 0–53)
AST: 19 U/L (ref 0–37)
Albumin: 4.5 g/dL (ref 3.5–5.2)
Alkaline Phosphatase: 76 U/L (ref 39–117)
BUN: 16 mg/dL (ref 6–23)
CO2: 31 meq/L (ref 19–32)
Calcium: 9.7 mg/dL (ref 8.4–10.5)
Chloride: 101 meq/L (ref 96–112)
Creatinine, Ser: 0.75 mg/dL (ref 0.40–1.50)
GFR: 85.26 mL/min (ref >60.00)
Glucose, Bld: 128 mg/dL — ABNORMAL HIGH (ref 70–99)
Potassium: 4.4 meq/L (ref 3.5–5.1)
Sodium: 139 meq/L (ref 135–145)
Total Bilirubin: 0.6 mg/dL (ref 0.2–1.2)
Total Protein: 7.4 g/dL (ref 6.0–8.3)

## 2023-10-02 LAB — HEMOGLOBIN A1C: Hgb A1c MFr Bld: 8.7 % — ABNORMAL HIGH (ref 4.6–6.5)

## 2023-10-02 MED ORDER — GLIMEPIRIDE 4 MG PO TABS: 4.0000 mg | ORAL_TABLET | Freq: Two times a day (BID) | ORAL | 3 refills | Status: AC

## 2023-10-02 MED ORDER — TRUE METRIX BLOOD GLUCOSE TEST VI STRP: ORAL_STRIP | 3 refills | Status: AC

## 2023-10-02 MED ORDER — METHADONE HCL 10 MG PO TABS: 20.0000 mg | ORAL_TABLET | Freq: Two times a day (BID) | ORAL | 0 refills | Status: DC | PRN

## 2023-10-02 MED ORDER — METHADONE HCL 10 MG PO TABS
20.0000 mg | ORAL_TABLET | Freq: Two times a day (BID) | ORAL | 0 refills | Status: AC | PRN
Start: 2023-10-02 — End: ?

## 2023-10-02 NOTE — Assessment & Plan Note (Signed)
Due to Xeloda 2017

## 2023-10-02 NOTE — Assessment & Plan Note (Signed)
 Monitor labs AFP q 12 mo

## 2023-10-02 NOTE — Assessment & Plan Note (Signed)
 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 addiction risk, apnea etc) and complications (i.e. Somnolence, constipation and others) were explained to the patient and were aknowledged. 3/20 - reducing the dose per Jareth's request w/caution Diazepam Benzo use risk is discussed - taking very little Narcane option

## 2023-10-02 NOTE — Progress Notes (Signed)
 Subjective:  Patient ID: Gabriel Erickson, male    DOB: 04/08/1941  Age: 83 y.o. MRN: [id number]  CC: No chief complaint on file.   HPI Gabriel Erickson presents for DM, HTN, LBP  Outpatient Medications Prior to Visit  Medication Sig Dispense Refill   Alcohol Swabs (DROPSAFE ALCOHOL PREP) 70 % PADS USE TO CLEAN FINGER BEFORE CHECKING BLOOD SUGAR LEVEL TWO TIMES DAILY 200 each 3   b complex vitamins tablet Take 1 tablet by mouth daily.     Blood Glucose Calibration (TRUE METRIX LEVEL 2) Normal SOLN Use to calibrate meter 1 each 3   Blood Glucose Monitoring Suppl (TRUE METRIX AIR GLUCOSE METER) w/Device KIT Use as directed to check blood sugars 1 kit 0   Cholecalciferol 1000 UNITS tablet Take 1,000 Units by mouth daily.     diazepam (VALIUM) 5 MG tablet TAKE ONE TABLET TWICE DAILY AS NEEDED FOR muscle spasms, cramps, or insomnia 60 tablet 2   DROPLET PEN NEEDLES 32G X 4 MM MISC USE AS DIRECTED 300 each 3   famotidine (PEPCID) 40 MG tablet Take 1 tablet (40 mg total) by mouth daily. 90 tablet 3   fluticasone (FLONASE) 50 MCG/ACT nasal spray Place 2 sprays into the nose daily. 16 g 11   insulin glargine (LANTUS SOLOSTAR) 100 UNIT/ML Solostar Pen Inject 10 Units into the skin daily. Titrate up by 1 unit every 1-2 days for goal sugar  (CBG) 100-130 15 mL 11   loratadine (CLARITIN) 10 MG tablet Take 1 tablet (10 mg total) by mouth daily. 90 tablet 3   naloxone (NARCAN) nasal spray 4 mg/0.1 mL 1 actuation in one nostril once. May repeat in 2-3 min 1 each 2   RABEprazole (ACIPHEX) 20 MG tablet Take 1 tablet (20 mg total) by mouth daily. 90 tablet 3   tadalafil (CIALIS) 20 MG tablet Take 0.5-1 tablets (10-20 mg total) by mouth daily as needed for erectile dysfunction. 30 tablet 3   TRUEplus Lancets 30G MISC Use to check blood sugar BID 200 each 5   Blood Glucose Monitoring Suppl (TRUE METRIX METER) w/Device KIT Use to check blood sugar BID 1 kit 0   glimepiride (AMARYL) 4 MG tablet Take 1 tablet (4 mg  total) by mouth 2 (two) times daily. 180 tablet 3   glucose blood (TRUE METRIX BLOOD GLUCOSE TEST) test strip TEST BLOOD SUGAR TWICE DAILY 200 strip 3   methadone (DOLOPHINE) 10 MG tablet Take 2 tablets (20 mg total) by mouth 2 (two) times daily as needed for severe pain (pain score 7-10). For pain M54.5 120 tablet 0   methadone (DOLOPHINE) 10 MG tablet Take 2 tablets (20 mg total) by mouth 2 (two) times daily as needed for severe pain (pain score 7-10). 120 tablet 0   methadone (DOLOPHINE) 10 MG tablet Take 2 tablets (20 mg total) by mouth 2 (two) times daily as needed for severe pain (pain score 7-10). 120 tablet 0   No facility-administered medications prior to visit.    ROS: Review of Systems  Constitutional:  Negative for appetite change, fatigue and unexpected weight change.  HENT:  Negative for congestion, nosebleeds, sneezing, sore throat and trouble swallowing.   Eyes:  Negative for itching and visual disturbance.  Respiratory:  Negative for cough.   Cardiovascular:  Negative for chest pain, palpitations and leg swelling.  Gastrointestinal:  Negative for abdominal distention, blood in stool, diarrhea and nausea.  Genitourinary:  Negative for frequency and hematuria.  Musculoskeletal:  Positive for back pain and gait problem. Negative for joint swelling and neck pain.  Skin:  Negative for rash.  Neurological:  Negative for dizziness, tremors, speech difficulty and weakness.  Psychiatric/Behavioral:  Negative for agitation, dysphoric mood and sleep disturbance. The patient is not nervous/anxious.     Objective:  BP 122/80   Pulse 99   Temp 98.1 F (36.7 C) (Oral)   Ht 6' (1.829 m)   Wt 165 lb (74.8 kg)   SpO2 97%   BMI 22.38 kg/m   BP Readings from Last 3 Encounters:  10/02/23 122/80  07/03/23 110/68  04/04/23 122/82    Wt Readings from Last 3 Encounters:  10/02/23 165 lb (74.8 kg)  07/03/23 166 lb (75.3 kg)  04/06/23 167 lb (75.8 kg)    Physical  Exam Constitutional:      General: He is not in acute distress.    Appearance: He is well-developed.     Comments: NAD  Eyes:     Conjunctiva/sclera: Conjunctivae normal.     Pupils: Pupils are equal, round, and reactive to light.  Neck:     Thyroid: No thyromegaly.     Vascular: No JVD.  Cardiovascular:     Rate and Rhythm: Normal rate and regular rhythm.     Heart sounds: Normal heart sounds. No murmur heard.    No friction rub. No gallop.  Pulmonary:     Effort: Pulmonary effort is normal. No respiratory distress.     Breath sounds: Normal breath sounds. No wheezing or rales.  Chest:     Chest wall: No tenderness.  Abdominal:     General: Bowel sounds are normal. There is no distension.     Palpations: Abdomen is soft. There is no mass.     Tenderness: There is no abdominal tenderness. There is no guarding or rebound.  Musculoskeletal:        General: Tenderness present. Normal range of motion.     Cervical back: Normal range of motion.     Right lower leg: No edema.     Left lower leg: No edema.  Lymphadenopathy:     Cervical: No cervical adenopathy.  Skin:    General: Skin is warm and dry.     Findings: No rash.  Neurological:     Mental Status: He is alert and oriented to person, place, and time.     Cranial Nerves: No cranial nerve deficit.     Motor: No abnormal muscle tone.     Coordination: Coordination normal.     Gait: Gait abnormal.     Deep Tendon Reflexes: Reflexes are normal and symmetric.  Psychiatric:        Behavior: Behavior normal.        Thought Content: Thought content normal.        Judgment: Judgment normal.    LS w/pain Weak legs, antalgic gait   Lab Results  Component Value Date   WBC 7.0 01/03/2023   HGB 14.6 01/03/2023   HCT 44.5 01/03/2023   PLT 252.0 01/03/2023   GLUCOSE 130 (H) 07/03/2023   CHOL 190 07/07/2022   TRIG 149.0 07/07/2022   HDL 38.80 (L) 07/07/2022   LDLDIRECT 142.3 04/12/2010   LDLCALC 121 (H) 07/07/2022   ALT  24 07/03/2023   AST 26 07/03/2023   NA 139 07/03/2023   K 4.3 07/03/2023   CL 100 07/03/2023   CREATININE 0.78 07/03/2023   BUN 21 07/03/2023   CO2 32 07/03/2023   TSH  0.69 07/03/2023   PSA 3.45 07/03/2023   HGBA1C 8.7 (H) 07/03/2023   MICROALBUR 2.6 (H) 07/03/2023    US THYROID Result Date: 08/11/2022 CLINICAL DATA:  Prior ultrasound follow-up. TI-RADS category 3 nodule in the right mid gland currently under imaging surveillance. EXAM: THYROID ULTRASOUND TECHNIQUE: Ultrasound examination of the thyroid gland and adjacent soft tissues was performed. COMPARISON:  Prior thyroid ultrasound 08/04/2021 FINDINGS: Parenchymal Echotexture: Markedly heterogenous Isthmus: 0.3 cm Right lobe: 4.7 x 2.3 x 2.6 cm Left lobe: 5.2 x 2.8 x 2.0 cm _________________________________________________________ Estimated total number of nodules >/= 1 cm: 2 Number of spongiform nodules >/=  2 cm not described below (TR1): 0 Number of mixed cystic and solid nodules >/= 1.5 cm not described below (TR2): 0 _________________________________________________________ Nodule # 1: Small isoechoic nodule in the right upper gland measures only 1.2 cm. This does not meet criteria for further evaluation. Nodule # 2: Vague region of pseudo nodularity in the right inferior gland. This lesion is not clearly defined from the background thyroid parenchyma and is not considered a true thyroid nodule. Nodule # 3: Continued stability of isoechoic solid nodule (TI-RADS category 3) which is very ill-defined in the left mid gland at 2.2 x 1.7 x 1.7 cm compared to 2.2 x 2.0 x 1.7 cm previously. *Given size (>/= 1.5 - 2.4 cm) and appearance, a follow-up ultrasound in 1 year should be considered based on TI-RADS criteria. No new nodules or suspicious features. IMPRESSION: 1. Confirmed 1 year stability of TI-RADS category 3 nodule in the left mid gland. Recommend continued follow-up ultrasound every 1-2 years until 5 year stability is confirmed. 2.  Diffusely enlarged and heterogeneous thyroid gland. 3. No new nodules or suspicious features. The above is in keeping with the ACR TI-RADS recommendations - J Am Coll Radiol 2017;14:587-595. Electronically Signed   By: Malachy Moan M.D.   On: 08/11/2022 09:07    Assessment & Plan:   Problem List Items Addressed This Visit     Diabetes mellitus type 2, controlled (HCC)   Cont on Glimepiride Gabriel Erickson wants to stay on it), Lantus 12 u/d. 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 addiction risk, apnea etc) and complications (i.e. Somnolence, constipation and others) were explained to the patient and were aknowledged. 3/20 - reducing the dose per Jackston's request w/caution Diazepam Benzo use risk is discussed - taking very little Narcane option       Relevant Medications   methadone (DOLOPHINE) 10 MG tablet   methadone (DOLOPHINE) 10 MG tablet   methadone (DOLOPHINE) 10 MG tablet   History of colon cancer   F/u w/Dr Truett Perna Monitor CEA      Paresthesia   Due to Xeloda 2017         Meds ordered this encounter  Medications   methadone (DOLOPHINE) 10 MG tablet    Sig: Take 2 tablets (20 mg total) by mouth 2 (two) times daily as needed for severe pain (pain score 7-10). For pain M54.5    Dispense:  120 tablet    Refill:  0    Please fill on or after 10/28/23   methadone (DOLOPHINE) 10 MG tablet    Sig: Take 2 tablets (20 mg total) by mouth 2 (two) times daily as needed for severe pain (pain score 7-10).    Dispense:  120 tablet    Refill:  0    Please fill on or after 09/28/23   methadone (DOLOPHINE) 10 MG tablet    Sig: Take 2  tablets (20 mg total) by mouth 2 (two) times daily as needed for severe pain (pain score 7-10).    Dispense:  120 tablet    Refill:  0    Please fill on or after 11/27/23   glimepiride (AMARYL) 4 MG tablet    Sig: Take 1 tablet (4 mg total) by mouth 2 (two) times daily.    Dispense:  180 tablet    Refill:  3   glucose blood (TRUE METRIX BLOOD GLUCOSE TEST) test strip    Sig: TEST BLOOD SUGAR TWICE DAILY    Dispense:  200 strip    Refill:  3    Dx E11.9      Follow-up: Return in about 3 months (around 01/02/2024) for a follow-up visit.  Sonda Primes, MD

## 2023-10-02 NOTE — Assessment & Plan Note (Addendum)
 F/u w/Dr Truett Perna Monitor CEA

## 2023-10-02 NOTE — Assessment & Plan Note (Signed)
 Cont on Glimepiride Wenker wants to stay on it), Lantus 12 u/d. Pt declined CGM Check labs

## 2023-10-03 LAB — PMP SCREEN PROFILE (10S), URINE
Amphetamine Scrn, Ur: NEGATIVE ng/mL
BARBITURATE SCREEN URINE: NEGATIVE ng/mL
BENZODIAZEPINE SCREEN, URINE: NEGATIVE ng/mL
CANNABINOIDS UR QL SCN: NEGATIVE ng/mL
Cocaine (Metab) Scrn, Ur: NEGATIVE ng/mL
Creatinine(Crt), U: 115.8 mg/dL (ref 20.0–300.0)
Methadone Screen, Urine: NEGATIVE ng/mL
OXYCODONE+OXYMORPHONE UR QL SCN: NEGATIVE ng/mL
Opiate Scrn, Ur: NEGATIVE ng/mL
Ph of Urine: 5.5 (ref 4.5–8.9)
Phencyclidine Qn, Ur: NEGATIVE ng/mL
Propoxyphene Scrn, Ur: NEGATIVE ng/mL

## 2023-12-03 ENCOUNTER — Other Ambulatory Visit: Payer: Self-pay | Admitting: Internal Medicine

## 2023-12-22 ENCOUNTER — Other Ambulatory Visit: Payer: Self-pay | Admitting: Internal Medicine

## 2023-12-22 MED ORDER — RABEPRAZOLE SODIUM 20 MG PO TBEC: 20.0000 mg | DELAYED_RELEASE_TABLET | Freq: Every day | ORAL | 3 refills | Status: AC

## 2023-12-22 NOTE — Telephone Encounter (Signed)
 Copied from CRM [id number]. Topic: Clinical - Medication Refill >> Dec 22, 2023 10:13 AM Zipporah Him wrote: Medication: RABEprazole  (ACIPHEX ) 20 MG tablet  Has the patient contacted their pharmacy? Yes Pharmacy states they faxed this over to the office with no response  This is the patient's preferred pharmacy:   [facility] - [place], [place] - [address] [place] [address] Phone: [phone] Fax: [phone]  Is this the correct pharmacy for this prescription? Yes If no, delete pharmacy and type the correct one.   Has the prescription been filled recently? No  Is the patient out of the medication? No, a week left  Has the patient been seen for an appointment in the last year OR does the patient have an upcoming appointment? Yes  Can we respond through MyChart? Yes  Agent: Please be advised that Rx refills may take up to 3 business days. We ask that you follow-up with your pharmacy.

## 2023-12-23 ENCOUNTER — Other Ambulatory Visit: Payer: Self-pay | Admitting: Internal Medicine

## 2024-01-01 ENCOUNTER — Other Ambulatory Visit: Payer: Self-pay | Admitting: Internal Medicine

## 2024-01-03 ENCOUNTER — Ambulatory Visit: Admitting: Internal Medicine

## 2024-01-03 ENCOUNTER — Encounter: Payer: Self-pay | Admitting: Internal Medicine

## 2024-01-03 VITALS — BP 132/86 | HR 72 | Temp 97.8°F | Ht 72.0 in | Wt 161.0 lb

## 2024-01-03 DIAGNOSIS — Z7984 Long term (current) use of oral hypoglycemic drugs: Secondary | ICD-10-CM | POA: Diagnosis not present

## 2024-01-03 DIAGNOSIS — M5440 Lumbago with sciatica, unspecified side: Secondary | ICD-10-CM | POA: Diagnosis not present

## 2024-01-03 DIAGNOSIS — Z85038 Personal history of other malignant neoplasm of large intestine: Secondary | ICD-10-CM | POA: Diagnosis not present

## 2024-01-03 DIAGNOSIS — G8929 Other chronic pain: Secondary | ICD-10-CM

## 2024-01-03 DIAGNOSIS — G609 Hereditary and idiopathic neuropathy, unspecified: Secondary | ICD-10-CM | POA: Diagnosis not present

## 2024-01-03 DIAGNOSIS — K219 Gastro-esophageal reflux disease without esophagitis: Secondary | ICD-10-CM | POA: Diagnosis not present

## 2024-01-03 DIAGNOSIS — E1142 Type 2 diabetes mellitus with diabetic polyneuropathy: Secondary | ICD-10-CM

## 2024-01-03 LAB — COMPREHENSIVE METABOLIC PANEL WITH GFR
ALT: 19 U/L (ref 0–53)
AST: 22 U/L (ref 0–37)
Albumin: 4.5 g/dL (ref 3.5–5.2)
Alkaline Phosphatase: 66 U/L (ref 39–117)
BUN: 18 mg/dL (ref 6–23)
CO2: 33 meq/L — ABNORMAL HIGH (ref 19–32)
Calcium: 9.5 mg/dL (ref 8.4–10.5)
Chloride: 101 meq/L (ref 96–112)
Creatinine, Ser: 0.81 mg/dL (ref 0.40–1.50)
GFR: 83.15 mL/min (ref >60.00)
Glucose, Bld: 89 mg/dL (ref 70–99)
Potassium: 3.8 meq/L (ref 3.5–5.1)
Sodium: 139 meq/L (ref 135–145)
Total Bilirubin: 0.5 mg/dL (ref 0.2–1.2)
Total Protein: 7.8 g/dL (ref 6.0–8.3)

## 2024-01-03 LAB — HEMOGLOBIN A1C: Hgb A1c MFr Bld: 8.3 % — ABNORMAL HIGH (ref 4.6–6.5)

## 2024-01-03 MED ORDER — FLUTICASONE PROPIONATE 50 MCG/ACT NA SUSP: 2.0000 | Freq: Every day | NASAL | 11 refills | Status: AC

## 2024-01-03 MED ORDER — METHADONE HCL 10 MG PO TABS
20.0000 mg | ORAL_TABLET | Freq: Two times a day (BID) | ORAL | 0 refills | Status: DC | PRN
Start: 2024-01-03 — End: 2024-04-08

## 2024-01-03 MED ORDER — DIAZEPAM 5 MG PO TABS: ORAL_TABLET | ORAL | 2 refills | Status: DC

## 2024-01-03 MED ORDER — METHADONE HCL 10 MG PO TABS: 20.0000 mg | ORAL_TABLET | Freq: Two times a day (BID) | ORAL | 0 refills | Status: DC | PRN

## 2024-01-03 NOTE — Assessment & Plan Note (Signed)
On Aciphex po

## 2024-01-03 NOTE — Assessment & Plan Note (Signed)
Continue with current prescription therapy as reflected on the Med list.  

## 2024-01-03 NOTE — Assessment & Plan Note (Signed)
 Monitor labs AFP q 12 mo

## 2024-01-03 NOTE — Assessment & Plan Note (Signed)
 Cont on Glimepiride Wenker wants to stay on it), Lantus 12 u/d. Pt declined CGM Check labs

## 2024-01-03 NOTE — Assessment & Plan Note (Signed)
 F/u w/Dr Truett Perna Monitor CEA

## 2024-01-03 NOTE — Progress Notes (Signed)
 Subjective:  Patient ID: Gabriel Erickson, male    DOB: 08/25/1940  Age: 83 y.o. MRN: [id number]  CC: Medical Management of Chronic Issues (3 month F/u no concerns )   HPI Gabriel Erickson presents for LBP, DM, GERD  Outpatient Medications Prior to Visit  Medication Sig Dispense Refill   Alcohol  Swabs  (DROPSAFE ALCOHOL  PREP) 70 % PADS USE TO CLEAN FINGER BEFORE CHECKING BLOOD SUGAR LEVEL TWO TIMES DAILY 200 each 3   b complex vitamins tablet Take 1 tablet by mouth daily.     Blood Glucose Calibration (TRUE METRIX LEVEL 2) Normal SOLN Use to calibrate meter 1 each 3   Blood Glucose Monitoring Suppl (TRUE METRIX AIR GLUCOSE METER) w/Device KIT Use as directed to check blood sugars 1 kit 0   Cholecalciferol 1000 UNITS tablet Take 1,000 Units by mouth daily.     DROPLET PEN NEEDLES 32G X 4 MM MISC USE AS DIRECTED 300 each 3   famotidine  (PEPCID ) 40 MG tablet Take 1 tablet (40 mg total) by mouth daily. 90 tablet 3   glimepiride  (AMARYL ) 4 MG tablet Take 1 tablet (4 mg total) by mouth 2 (two) times daily. 180 tablet 3   glucose blood (TRUE METRIX BLOOD GLUCOSE TEST) test strip TEST BLOOD SUGAR TWICE DAILY 200 strip 3   insulin  glargine (LANTUS  SOLOSTAR) 100 UNIT/ML Solostar Pen Inject 10 Units into the skin daily. Titrate up by 1 unit every 1-2 days for goal sugar  (CBG) 100-130 15 mL 11   Lancets Ultra Thin 30G MISC TEST BLOOD SUGAR TWICE DAILY 200 each 3   loratadine  (CLARITIN ) 10 MG tablet Take 1 tablet (10 mg total) by mouth daily. 90 tablet 3   naloxone  (NARCAN ) nasal spray 4 mg/0.1 mL 1 actuation in one nostril once. May repeat in 2-3 min 1 each 2   RABEprazole  (ACIPHEX ) 20 MG tablet Take 1 tablet (20 mg total) by mouth daily. 90 tablet 3   tadalafil  (CIALIS ) 20 MG tablet Take 0.5-1 tablets (10-20 mg total) by mouth daily as needed for erectile dysfunction. 30 tablet 3   diazepam  (VALIUM ) 5 MG tablet TAKE ONE TABLET TWICE DAILY AS NEEDED FOR muscle spasms, cramps, or insomnia 60 tablet 2    fluticasone  (FLONASE ) 50 MCG/ACT nasal spray Place 2 sprays into the nose daily. 16 g 11   methadone  (DOLOPHINE ) 10 MG tablet Take 2 tablets (20 mg total) by mouth 2 (two) times daily as needed for severe pain (pain score 7-10). For pain M54.5 120 tablet 0   methadone  (DOLOPHINE ) 10 MG tablet Take 2 tablets (20 mg total) by mouth 2 (two) times daily as needed for severe pain (pain score 7-10). 120 tablet 0   methadone  (DOLOPHINE ) 10 MG tablet Take 2 tablets (20 mg total) by mouth 2 (two) times daily as needed for severe pain (pain score 7-10). 120 tablet 0   No facility-administered medications prior to visit.    ROS: Review of Systems  Constitutional:  Negative for appetite change, fatigue and unexpected weight change.  HENT:  Negative for congestion, nosebleeds, sneezing, sore throat and trouble swallowing.   Eyes:  Negative for itching and visual disturbance.  Respiratory:  Negative for cough.   Cardiovascular:  Negative for chest pain, palpitations and leg swelling.  Gastrointestinal:  Negative for abdominal distention, blood in stool, diarrhea and nausea.  Genitourinary:  Negative for frequency and hematuria.  Musculoskeletal:  Positive for back pain and gait problem. Negative for joint swelling and neck pain.  Skin:  Negative for rash.  Neurological:  Negative for dizziness, tremors, speech difficulty and weakness.  Psychiatric/Behavioral:  Negative for agitation, dysphoric mood and sleep disturbance. The patient is not nervous/anxious.     Objective:  BP 132/86   Pulse 72   Temp 97.8 F (36.6 C) (Oral)   Ht 6' (1.829 m)   Wt 161 lb (73 kg)   SpO2 97%   BMI 21.84 kg/m   BP Readings from Last 3 Encounters:  01/03/24 132/86  10/02/23 122/80  07/03/23 110/68    Wt Readings from Last 3 Encounters:  01/03/24 161 lb (73 kg)  10/02/23 165 lb (74.8 kg)  07/03/23 166 lb (75.3 kg)    Physical Exam Constitutional:      General: He is not in acute distress.    Appearance:  Normal appearance. He is well-developed.     Comments: NAD   Eyes:     Conjunctiva/sclera: Conjunctivae normal.     Pupils: Pupils are equal, round, and reactive to light.   Neck:     Thyroid : No thyromegaly.     Vascular: No JVD.   Cardiovascular:     Rate and Rhythm: Normal rate and regular rhythm.     Heart sounds: Normal heart sounds. No murmur heard.    No friction rub. No gallop.  Pulmonary:     Effort: Pulmonary effort is normal. No respiratory distress.     Breath sounds: Normal breath sounds. No wheezing or rales.  Chest:     Chest wall: No tenderness.  Abdominal:     General: Bowel sounds are normal. There is no distension.     Palpations: Abdomen is soft. There is no mass.     Tenderness: There is no abdominal tenderness. There is no guarding or rebound.   Musculoskeletal:        General: Tenderness present. Normal range of motion.     Cervical back: Normal range of motion.  Lymphadenopathy:     Cervical: No cervical adenopathy.   Skin:    General: Skin is warm and dry.     Findings: No rash.   Neurological:     Mental Status: He is alert and oriented to person, place, and time.     Cranial Nerves: No cranial nerve deficit.     Motor: No abnormal muscle tone.     Coordination: Coordination normal.     Gait: Gait normal.     Deep Tendon Reflexes: Reflexes are normal and symmetric.   Psychiatric:        Behavior: Behavior normal.        Thought Content: Thought content normal.        Judgment: Judgment normal.   LS spine w/pain Weak legs  Lab Results  Component Value Date   WBC 7.0 01/03/2023   HGB 14.6 01/03/2023   HCT 44.5 01/03/2023   PLT 252.0 01/03/2023   GLUCOSE 128 (H) 10/02/2023   CHOL 190 07/07/2022   TRIG 149.0 07/07/2022   HDL 38.80 (L) 07/07/2022   LDLDIRECT 142.3 04/12/2010   LDLCALC 121 (H) 07/07/2022   ALT 20 10/02/2023   AST 19 10/02/2023   NA 139 10/02/2023   K 4.4 10/02/2023   CL 101 10/02/2023   CREATININE 0.75 10/02/2023    BUN 16 10/02/2023   CO2 31 10/02/2023   TSH 0.69 07/03/2023   PSA 3.45 07/03/2023   HGBA1C 8.7 (H) 10/02/2023   MICROALBUR 2.6 (H) 07/03/2023    US  THYROID  Result Date: 08/11/2022  CLINICAL DATA:  Prior ultrasound follow-up. TI-RADS category 3 nodule in the right mid gland currently under imaging surveillance. EXAM: THYROID  ULTRASOUND TECHNIQUE: Ultrasound examination of the thyroid  gland and adjacent soft tissues was performed. COMPARISON:  Prior thyroid  ultrasound 08/04/2021 FINDINGS: Parenchymal Echotexture: Markedly heterogenous Isthmus: 0.3 cm Right lobe: 4.7 x 2.3 x 2.6 cm Left lobe: 5.2 x 2.8 x 2.0 cm _________________________________________________________ Estimated total number of nodules >/= 1 cm: 2 Number of spongiform nodules >/=  2 cm not described below (TR1): 0 Number of mixed cystic and solid nodules >/= 1.5 cm not described below (TR2): 0 _________________________________________________________ Nodule # 1: Small isoechoic nodule in the right upper gland measures only 1.2 cm. This does not meet criteria for further evaluation. Nodule # 2: Vague region of pseudo nodularity in the right inferior gland. This lesion is not clearly defined from the background thyroid  parenchyma and is not considered a true thyroid  nodule. Nodule # 3: Continued stability of isoechoic solid nodule (TI-RADS category 3) which is very ill-defined in the left mid gland at 2.2 x 1.7 x 1.7 cm compared to 2.2 x 2.0 x 1.7 cm previously. *Given size (>/= 1.5 - 2.4 cm) and appearance, a follow-up ultrasound in 1 year should be considered based on TI-RADS criteria. No new nodules or suspicious features. IMPRESSION: 1. Confirmed 1 year stability of TI-RADS category 3 nodule in the left mid gland. Recommend continued follow-up ultrasound every 1-2 years until 5 year stability is confirmed. 2. Diffusely enlarged and heterogeneous thyroid  gland. 3. No new nodules or suspicious features. The above is in keeping with the ACR  TI-RADS recommendations - J Am Coll Radiol 2017;14:587-595. Electronically Signed   By: Fernando Hoyer M.D.   On: 08/11/2022 09:07    Assessment & Plan:   Problem List Items Addressed This Visit     Diabetes mellitus type 2, controlled (HCC)   Cont on Glimepiride  Margraf wants to stay on it), Lantus  12 u/d. Pt declined CGM Check labs      Relevant Orders   Comprehensive metabolic panel with GFR   Hemoglobin A1c   Hemochromatosis   Monitor labs AFP q 12 mo       Relevant Orders   Comprehensive metabolic panel with GFR   Hemoglobin A1c   Hereditary and idiopathic peripheral neuropathy   Continue with current prescription therapy as reflected on the Med list.       Relevant Medications   diazepam  (VALIUM ) 5 MG tablet   GERD (gastroesophageal reflux disease)   On Aciphex  po      LOW BACK PAIN - Primary   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 addiction risk, apnea etc) and complications (i.e. Somnolence, constipation and others) were explained to the patient and were aknowledged. 3/20 - reducing the dose per Wm's request w/caution Diazepam  Benzo use risk is discussed - taking very little Narcane option       Relevant Medications   methadone  (DOLOPHINE ) 10 MG tablet   methadone  (DOLOPHINE ) 10 MG tablet   methadone  (DOLOPHINE ) 10 MG tablet   History of colon cancer   F/u w/Dr Scherrie Curt Monitor CEA         Meds ordered this encounter  Medications   diazepam  (VALIUM ) 5 MG tablet    Sig: TAKE ONE TABLET TWICE DAILY AS NEEDED FOR muscle spasms, cramps, or insomnia    Dispense:  60  tablet    Refill:  2   fluticasone  (FLONASE ) 50 MCG/ACT nasal spray    Sig: Place 2 sprays into both nostrils daily.    Dispense:  16 g    Refill:  11   methadone  (DOLOPHINE ) 10 MG tablet    Sig: Take 2 tablets (20 mg total) by mouth 2 (two) times daily as needed for severe pain  (pain score 7-10). For pain M54.5    Dispense:  120 tablet    Refill:  0    Please fill on or after 12/28/23   methadone  (DOLOPHINE ) 10 MG tablet    Sig: Take 2 tablets (20 mg total) by mouth 2 (two) times daily as needed for severe pain (pain score 7-10).    Dispense:  120 tablet    Refill:  0    Please fill on or after 01/27/24   methadone  (DOLOPHINE ) 10 MG tablet    Sig: Take 2 tablets (20 mg total) by mouth 2 (two) times daily as needed for severe pain (pain score 7-10).    Dispense:  120 tablet    Refill:  0    Please fill on or after 02/26/24      Follow-up: Return in about 3 months (around 04/04/2024) for a follow-up visit.  Anitra Barn, MD

## 2024-01-03 NOTE — Assessment & Plan Note (Signed)
 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 addiction risk, apnea etc) and complications (i.e. Somnolence, constipation and others) were explained to the patient and were aknowledged. 3/20 - reducing the dose per Gabriel Erickson's request w/caution Diazepam Benzo use risk is discussed - taking very little Narcane option

## 2024-01-04 ENCOUNTER — Ambulatory Visit: Payer: Self-pay | Admitting: Internal Medicine

## 2024-01-05 NOTE — Telephone Encounter (Signed)
 Copied from CRM [id number]. Topic: Clinical - Lab/Test Results >> Jan 05, 2024 11:54 AM Albertha Alosa wrote: Reason for CRM: Patient called in regarding lab results, relay message patient stated he understood and had no further questions

## 2024-04-08 ENCOUNTER — Encounter: Payer: Self-pay | Admitting: Internal Medicine

## 2024-04-08 ENCOUNTER — Ambulatory Visit: Admitting: Internal Medicine

## 2024-04-08 VITALS — BP 130/86 | HR 84 | Temp 98.7°F | Ht 72.0 in | Wt 159.8 lb

## 2024-04-08 DIAGNOSIS — G8929 Other chronic pain: Secondary | ICD-10-CM

## 2024-04-08 DIAGNOSIS — K219 Gastro-esophageal reflux disease without esophagitis: Secondary | ICD-10-CM | POA: Diagnosis not present

## 2024-04-08 DIAGNOSIS — E1142 Type 2 diabetes mellitus with diabetic polyneuropathy: Secondary | ICD-10-CM | POA: Diagnosis not present

## 2024-04-08 DIAGNOSIS — M5440 Lumbago with sciatica, unspecified side: Secondary | ICD-10-CM

## 2024-04-08 DIAGNOSIS — Z794 Long term (current) use of insulin: Secondary | ICD-10-CM

## 2024-04-08 LAB — CBC WITH DIFFERENTIAL/PLATELET
Basophils Absolute: 0 K/uL (ref 0.0–0.1)
Basophils Relative: 0.6 % (ref 0.0–3.0)
Eosinophils Absolute: 0.1 K/uL (ref 0.0–0.7)
Eosinophils Relative: 1.1 % (ref 0.0–5.0)
HCT: 44 % (ref 39.0–52.0)
Hemoglobin: 14.9 g/dL (ref 13.0–17.0)
Lymphocytes Relative: 30.7 % (ref 12.0–46.0)
Lymphs Abs: 2 K/uL (ref 0.7–4.0)
MCHC: 33.8 g/dL (ref 30.0–36.0)
MCV: 95 fl (ref 78.0–100.0)
Monocytes Absolute: 0.7 K/uL (ref 0.1–1.0)
Monocytes Relative: 11 % (ref 3.0–12.0)
Neutro Abs: 3.6 K/uL (ref 1.4–7.7)
Neutrophils Relative %: 56.6 % (ref 43.0–77.0)
Platelets: 246 K/uL (ref 150.0–400.0)
RBC: 4.63 Mil/uL (ref 4.22–5.81)
RDW: 12.3 % (ref 11.5–15.5)
WBC: 6.4 K/uL (ref 4.0–10.5)

## 2024-04-08 LAB — COMPREHENSIVE METABOLIC PANEL WITH GFR
ALT: 20 U/L (ref 0–53)
AST: 21 U/L (ref 0–37)
Albumin: 4.5 g/dL (ref 3.5–5.2)
Alkaline Phosphatase: 70 U/L (ref 39–117)
BUN: 19 mg/dL (ref 6–23)
CO2: 33 meq/L — ABNORMAL HIGH (ref 19–32)
Calcium: 10 mg/dL (ref 8.4–10.5)
Chloride: 100 meq/L (ref 96–112)
Creatinine, Ser: 0.81 mg/dL (ref 0.40–1.50)
GFR: 83 mL/min (ref >60.00)
Glucose, Bld: 105 mg/dL — ABNORMAL HIGH (ref 70–99)
Potassium: 4.5 meq/L (ref 3.5–5.1)
Sodium: 138 meq/L (ref 135–145)
Total Bilirubin: 0.6 mg/dL (ref 0.2–1.2)
Total Protein: 7.7 g/dL (ref 6.0–8.3)

## 2024-04-08 LAB — HEMOGLOBIN A1C: Hgb A1c MFr Bld: 8.5 % — ABNORMAL HIGH (ref 4.6–6.5)

## 2024-04-08 MED ORDER — METHADONE HCL 10 MG PO TABS: 20.0000 mg | ORAL_TABLET | Freq: Two times a day (BID) | ORAL | 0 refills | Status: DC | PRN

## 2024-04-08 MED ORDER — DIAZEPAM 5 MG PO TABS: ORAL_TABLET | ORAL | 2 refills | Status: DC

## 2024-04-08 NOTE — Assessment & Plan Note (Addendum)
 Monitor labs AFP q 12 mo Keep Hct <45%

## 2024-04-08 NOTE — Assessment & Plan Note (Signed)
 Cont on Glimepiride  Gravley wants to stay on it), Lantus  12 u/d. Pt declined CGM Check labs again

## 2024-04-08 NOTE — Assessment & Plan Note (Signed)
 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 addiction risk, apnea etc) and complications (i.e. Somnolence, constipation and others) were explained to the patient and were aknowledged. 3/20 - reducing the dose per Gabriel Erickson's request w/caution Diazepam Benzo use risk is discussed - taking very little Narcane option

## 2024-04-08 NOTE — Progress Notes (Signed)
 Subjective:  Patient ID: Gabriel Erickson, male    DOB: 07-16-1941  Age: 83 y.o. MRN: [id number]  CC: Follow-up (Patient states everything has been pretty much the same as before, nothing has changed much. Nothing new to discuss. )   HPI Gabriel Erickson presents for chronic pain, DM, GERD  Outpatient Medications Prior to Visit  Medication Sig Dispense Refill   Alcohol  Swabs  (DROPSAFE ALCOHOL  PREP) 70 % PADS USE TO CLEAN FINGER BEFORE CHECKING BLOOD SUGAR LEVEL TWO TIMES DAILY 200 each 3   b complex vitamins tablet Take 1 tablet by mouth daily.     Blood Glucose Calibration (TRUE METRIX LEVEL 2) Normal SOLN Use to calibrate meter 1 each 3   Blood Glucose Monitoring Suppl (TRUE METRIX AIR GLUCOSE METER) w/Device KIT Use as directed to check blood sugars 1 kit 0   Cholecalciferol 1000 UNITS tablet Take 1,000 Units by mouth daily.     DROPLET PEN NEEDLES 32G X 4 MM MISC USE AS DIRECTED 300 each 3   fluticasone  (FLONASE ) 50 MCG/ACT nasal spray Place 2 sprays into both nostrils daily. 16 g 11   glimepiride  (AMARYL ) 4 MG tablet Take 1 tablet (4 mg total) by mouth 2 (two) times daily. 180 tablet 3   glucose blood (TRUE METRIX BLOOD GLUCOSE TEST) test strip TEST BLOOD SUGAR TWICE DAILY 200 strip 3   insulin  glargine (LANTUS  SOLOSTAR) 100 UNIT/ML Solostar Pen Inject 10 Units into the skin daily. Titrate up by 1 unit every 1-2 days for goal sugar  (CBG) 100-130 15 mL 11   Lancets Ultra Thin 30G MISC TEST BLOOD SUGAR TWICE DAILY 200 each 3   naloxone  (NARCAN ) nasal spray 4 mg/0.1 mL 1 actuation in one nostril once. May repeat in 2-3 min 1 each 2   RABEprazole  (ACIPHEX ) 20 MG tablet Take 1 tablet (20 mg total) by mouth daily. 90 tablet 3   diazepam  (VALIUM ) 5 MG tablet TAKE ONE TABLET TWICE DAILY AS NEEDED FOR muscle spasms, cramps, or insomnia 60 tablet 2   methadone  (DOLOPHINE ) 10 MG tablet Take 2 tablets (20 mg total) by mouth 2 (two) times daily as needed for severe pain (pain score 7-10). For pain M54.5  120 tablet 0   methadone  (DOLOPHINE ) 10 MG tablet Take 2 tablets (20 mg total) by mouth 2 (two) times daily as needed for severe pain (pain score 7-10). 120 tablet 0   methadone  (DOLOPHINE ) 10 MG tablet Take 2 tablets (20 mg total) by mouth 2 (two) times daily as needed for severe pain (pain score 7-10). 120 tablet 0   famotidine  (PEPCID ) 40 MG tablet Take 1 tablet (40 mg total) by mouth daily. (Patient not taking: Reported on 04/08/2024) 90 tablet 3   loratadine  (CLARITIN ) 10 MG tablet Take 1 tablet (10 mg total) by mouth daily. (Patient not taking: Reported on 04/08/2024) 90 tablet 3   tadalafil  (CIALIS ) 20 MG tablet Take 0.5-1 tablets (10-20 mg total) by mouth daily as needed for erectile dysfunction. (Patient not taking: Reported on 04/08/2024) 30 tablet 3   No facility-administered medications prior to visit.    ROS: Review of Systems  Constitutional:  Negative for appetite change, fatigue and unexpected weight change.  HENT:  Negative for congestion, nosebleeds, sneezing, sore throat and trouble swallowing.   Eyes:  Negative for itching and visual disturbance.  Respiratory:  Negative for cough.   Cardiovascular:  Negative for chest pain, palpitations and leg swelling.  Gastrointestinal:  Negative for abdominal distention, blood in  stool, diarrhea and nausea.  Genitourinary:  Negative for frequency and hematuria.  Musculoskeletal:  Positive for back pain and gait problem. Negative for joint swelling and neck pain.  Skin:  Negative for rash.  Neurological:  Negative for dizziness, tremors, speech difficulty and weakness.  Psychiatric/Behavioral:  Negative for agitation, dysphoric mood and sleep disturbance. The patient is not nervous/anxious.     Objective:  BP 130/86   Pulse 84   Temp 98.7 F (37.1 C) (Oral)   Ht 6' (1.829 m)   Wt 159 lb 12.8 oz (72.5 kg)   SpO2 97%   BMI 21.67 kg/m   BP Readings from Last 3 Encounters:  04/08/24 130/86  01/03/24 132/86  10/02/23 122/80     Wt Readings from Last 3 Encounters:  04/08/24 159 lb 12.8 oz (72.5 kg)  01/03/24 161 lb (73 kg)  10/02/23 165 lb (74.8 kg)    Physical Exam Constitutional:      General: He is not in acute distress.    Appearance: Normal appearance. He is well-developed.     Comments: NAD  Eyes:     Conjunctiva/sclera: Conjunctivae normal.     Pupils: Pupils are equal, round, and reactive to light.  Neck:     Thyroid : No thyromegaly.     Vascular: No JVD.  Cardiovascular:     Rate and Rhythm: Normal rate and regular rhythm.     Heart sounds: Normal heart sounds. No murmur heard.    No friction rub. No gallop.  Pulmonary:     Effort: Pulmonary effort is normal. No respiratory distress.     Breath sounds: Normal breath sounds. No wheezing or rales.  Chest:     Chest wall: No tenderness.  Abdominal:     General: Bowel sounds are normal. There is no distension.     Palpations: Abdomen is soft. There is no mass.     Tenderness: There is no abdominal tenderness. There is no guarding or rebound.  Musculoskeletal:        General: Tenderness present. Normal range of motion.     Cervical back: Normal range of motion.     Right lower leg: No edema.     Left lower leg: No edema.  Lymphadenopathy:     Cervical: No cervical adenopathy.  Skin:    General: Skin is warm and dry.     Findings: No rash.  Neurological:     Mental Status: He is alert and oriented to person, place, and time.     Cranial Nerves: No cranial nerve deficit.     Motor: Weakness present. No abnormal muscle tone.     Coordination: Coordination normal.     Gait: Gait abnormal.     Deep Tendon Reflexes: Reflexes are normal and symmetric.  Psychiatric:        Behavior: Behavior normal.        Thought Content: Thought content normal.        Judgment: Judgment normal.    Weak legs   Lab Results  Component Value Date   WBC 7.0 01/03/2023   HGB 14.6 01/03/2023   HCT 44.5 01/03/2023   PLT 252.0 01/03/2023   GLUCOSE 89  01/03/2024   CHOL 190 07/07/2022   TRIG 149.0 07/07/2022   HDL 38.80 (L) 07/07/2022   LDLDIRECT 142.3 04/12/2010   LDLCALC 121 (H) 07/07/2022   ALT 19 01/03/2024   AST 22 01/03/2024   NA 139 01/03/2024   K 3.8 01/03/2024   CL 101 01/03/2024  CREATININE 0.81 01/03/2024   BUN 18 01/03/2024   CO2 33 (H) 01/03/2024   TSH 0.69 07/03/2023   PSA 3.45 07/03/2023   HGBA1C 8.3 (H) 01/03/2024    US  THYROID  Result Date: 08/11/2022 CLINICAL DATA:  Prior ultrasound follow-up. TI-RADS category 3 nodule in the right mid gland currently under imaging surveillance. EXAM: THYROID  ULTRASOUND TECHNIQUE: Ultrasound examination of the thyroid  gland and adjacent soft tissues was performed. COMPARISON:  Prior thyroid  ultrasound 08/04/2021 FINDINGS: Parenchymal Echotexture: Markedly heterogenous Isthmus: 0.3 cm Right lobe: 4.7 x 2.3 x 2.6 cm Left lobe: 5.2 x 2.8 x 2.0 cm _________________________________________________________ Estimated total number of nodules >/= 1 cm: 2 Number of spongiform nodules >/=  2 cm not described below (TR1): 0 Number of mixed cystic and solid nodules >/= 1.5 cm not described below (TR2): 0 _________________________________________________________ Nodule # 1: Small isoechoic nodule in the right upper gland measures only 1.2 cm. This does not meet criteria for further evaluation. Nodule # 2: Vague region of pseudo nodularity in the right inferior gland. This lesion is not clearly defined from the background thyroid  parenchyma and is not considered a true thyroid  nodule. Nodule # 3: Continued stability of isoechoic solid nodule (TI-RADS category 3) which is very ill-defined in the left mid gland at 2.2 x 1.7 x 1.7 cm compared to 2.2 x 2.0 x 1.7 cm previously. *Given size (>/= 1.5 - 2.4 cm) and appearance, a follow-up ultrasound in 1 year should be considered based on TI-RADS criteria. No new nodules or suspicious features. IMPRESSION: 1. Confirmed 1 year stability of TI-RADS category 3 nodule  in the left mid gland. Recommend continued follow-up ultrasound every 1-2 years until 5 year stability is confirmed. 2. Diffusely enlarged and heterogeneous thyroid  gland. 3. No new nodules or suspicious features. The above is in keeping with the ACR TI-RADS recommendations - J Am Coll Radiol 2017;14:587-595. Electronically Signed   By: Wilkie Lent M.D.   On: 08/11/2022 09:07    Assessment & Plan:   Problem List Items Addressed This Visit     Diabetes mellitus type 2, controlled (HCC) - Primary   Cont on Glimepiride  Dung wants to stay on it), Lantus  12 u/d. 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 addiction risk, apnea etc) and complications (i.e. Somnolence, constipation and others) were explained to the patient and were aknowledged. 3/20 - reducing the dose per Takuma's request w/caution Diazepam  Benzo use risk is discussed - taking very little Narcane option       Relevant Medications   methadone  (DOLOPHINE ) 10 MG tablet   methadone  (DOLOPHINE ) 10 MG tablet   methadone  (DOLOPHINE ) 10 MG tablet      Meds ordered this encounter  Medications   methadone  (DOLOPHINE ) 10 MG tablet    Sig: Take 2 tablets (20 mg total) by mouth 2 (two) times daily as needed for severe pain (pain score 7-10). For pain M54.5    Dispense:  120 tablet  Refill:  0    Please fill on or after 04/08/24. Code: M54.50   methadone  (DOLOPHINE ) 10 MG tablet    Sig: Take 2 tablets (20 mg total) by mouth 2 (two) times daily as needed for severe pain (pain score  7-10).    Dispense:  120 tablet    Refill:  0    Please fill on or after 05/08/24. Code: M54.50   methadone  (DOLOPHINE ) 10 MG tablet    Sig: Take 2 tablets (20 mg total) by mouth 2 (two) times daily as needed for severe pain (pain score 7-10).    Dispense:  120 tablet    Refill:  0    Please fill on or after 06/07/24. Code: M54.50   diazepam  (VALIUM ) 5 MG tablet    Sig: TAKE ONE TABLET TWICE DAILY AS NEEDED FOR muscle spasms, cramps, or insomnia    Dispense:  60 tablet    Refill:  2      Follow-up: Return in about 3 months (around 07/08/2024) for a follow-up visit.  Marolyn Noel, MD

## 2024-04-08 NOTE — Assessment & Plan Note (Signed)
On Aciphex po

## 2024-04-09 ENCOUNTER — Ambulatory Visit: Admitting: Internal Medicine

## 2024-04-09 ENCOUNTER — Ambulatory Visit: Payer: Self-pay | Admitting: Internal Medicine

## 2024-04-09 ENCOUNTER — Ambulatory Visit: Payer: Medicare PPO

## 2024-04-09 VITALS — Ht 72.0 in | Wt 159.0 lb

## 2024-04-09 DIAGNOSIS — Z0000 Encounter for general adult medical examination without abnormal findings: Secondary | ICD-10-CM

## 2024-04-09 DIAGNOSIS — Z800 Family history of malignant neoplasm of digestive organs: Secondary | ICD-10-CM

## 2024-04-09 NOTE — Patient Instructions (Addendum)
 Gabriel Erickson,  Thank you for taking the time for your Medicare Wellness Visit. I appreciate your continued commitment to your health goals. Please review the care plan we discussed, and feel free to reach out if I can assist you further.  Medicare recommends these wellness visits once per year to help you and your care team stay ahead of potential health issues. These visits are designed to focus on prevention, allowing your provider to concentrate on managing your acute and chronic conditions during your regular appointments.  Please note that Annual Wellness Visits do not include a physical exam. Some assessments may be limited, especially if the visit was conducted virtually. If needed, we may recommend a separate in-person follow-up with your provider.  Ongoing Care Seeing your primary care provider every 3 to 6 months helps us  monitor your health and provide consistent, personalized care.   Referrals If a referral was made during today's visit and you haven't received any updates within two weeks, please contact the referred provider directly to check on the status.  Recommended Screenings:  Health Maintenance  Topic Date Due   Yearly kidney health urinalysis for diabetes  Never done   Complete foot exam   05/08/2012   Colon Cancer Screening  07/26/2020   Eye exam for diabetics  04/23/2024   DTaP/Tdap/Td vaccine (2 - Tdap) 05/26/2024   Hemoglobin A1C  10/06/2024   Yearly kidney function blood test for diabetes  04/08/2025   Medicare Annual Wellness Visit  04/09/2025   Pneumococcal Vaccine for age over 19  Completed   HPV Vaccine  Aged Out   Meningitis B Vaccine  Aged Out   COVID-19 Vaccine  Discontinued   Hepatitis C Screening  Discontinued   Zoster (Shingles) Vaccine  Discontinued       04/09/2024    3:53 PM  Advanced Directives  Does Patient Have a Medical Advance Directive? Yes  Type of Estate agent of [place];Living will  Copy of Healthcare Power of  Attorney in Chart? No - copy requested   Advance Care Planning is important because it: Ensures you receive medical care that aligns with your values, goals, and preferences. Provides guidance to your family and loved ones, reducing the emotional burden of decision-making during critical moments.  Vision: Annual vision screenings are recommended for early detection of glaucoma, cataracts, and diabetic retinopathy. These exams can also reveal signs of chronic conditions such as diabetes and high blood pressure.  Dental: Annual dental screenings help detect early signs of oral cancer, gum disease, and other conditions linked to overall health, including heart disease and diabetes.

## 2024-04-09 NOTE — Progress Notes (Cosign Needed Addendum)
 Subjective:   Gabriel Erickson is a 83 y.o. who presents for a Medicare Wellness preventive visit.  As a reminder, Annual Wellness Visits don't include a physical exam, and some assessments may be limited, especially if this visit is performed virtually. We may recommend an in-person follow-up visit with your provider if needed.  Visit Complete: Virtual I connected with  Gabriel Erickson on 04/09/24 by a audio enabled telemedicine application and verified that I am speaking with the correct person using two identifiers.  Patient Location: Home  Provider Location: Office/Clinic  I discussed the limitations of evaluation and management by telemedicine. The patient expressed understanding and agreed to proceed.  Vital Signs: Because this visit was a virtual/telehealth visit, some criteria may be missing or patient reported. Any vitals not documented were not able to be obtained and vitals that have been documented are patient reported.  VideoDeclined- This patient declined Librarian, academic. Therefore the visit was completed with audio only.  Persons Participating in Visit: Patient.  AWV Questionnaire: No: Patient Medicare AWV questionnaire was not completed prior to this visit.        Objective:    Today's Vitals   04/09/24 1552  Weight: 159 lb (72.1 kg)  Height: 6' (1.829 m)   Body mass index is 21.56 kg/m.     04/09/2024    3:53 PM 04/06/2023    3:38 PM 02/25/2022    3:00 PM 05/28/2018   10:45 AM 11/23/2017   10:43 AM 07/26/2017    8:00 AM 07/14/2017   12:59 PM  Advanced Directives  Does Patient Have a Medical Advance Directive? Yes Yes Yes Yes  Yes  Yes;No  Yes   Type of Estate agent of [place];Living will Healthcare Power of [place];Living will Healthcare Power of [place];Living will Healthcare Power of [place];Living will Healthcare Power of [place];Living will  Healthcare Power of Attorney  Does patient want to make changes  to medical advance directive?    No - Patient declined      Copy of Healthcare Power of Attorney in Chart? No - copy requested No - copy requested  No - copy requested  No - copy requested        Data saved with a previous flowsheet row definition    Current Medications (verified) Outpatient Encounter Medications as of 04/09/2024  Medication Sig   Alcohol  Swabs  (DROPSAFE ALCOHOL  PREP) 70 % PADS USE TO CLEAN FINGER BEFORE CHECKING BLOOD SUGAR LEVEL TWO TIMES DAILY   b complex vitamins tablet Take 1 tablet by mouth daily.   Blood Glucose Calibration (TRUE METRIX LEVEL 2) Normal SOLN Use to calibrate meter   Blood Glucose Monitoring Suppl (TRUE METRIX AIR GLUCOSE METER) w/Device KIT Use as directed to check blood sugars   Cholecalciferol 1000 UNITS tablet Take 1,000 Units by mouth daily.   diazepam  (VALIUM ) 5 MG tablet TAKE ONE TABLET TWICE DAILY AS NEEDED FOR muscle spasms, cramps, or insomnia   DROPLET PEN NEEDLES 32G X 4 MM MISC USE AS DIRECTED   fluticasone  (FLONASE ) 50 MCG/ACT nasal spray Place 2 sprays into both nostrils daily.   glimepiride  (AMARYL ) 4 MG tablet Take 1 tablet (4 mg total) by mouth 2 (two) times daily.   glucose blood (TRUE METRIX BLOOD GLUCOSE TEST) test strip TEST BLOOD SUGAR TWICE DAILY   insulin  glargine (LANTUS  SOLOSTAR) 100 UNIT/ML Solostar Pen Inject 10 Units into the skin daily. Titrate up by 1 unit every 1-2 days for goal sugar  (  CBG) 100-130   Lancets Ultra Thin 30G MISC TEST BLOOD SUGAR TWICE DAILY   methadone  (DOLOPHINE ) 10 MG tablet Take 2 tablets (20 mg total) by mouth 2 (two) times daily as needed for severe pain (pain score 7-10). For pain M54.5   methadone  (DOLOPHINE ) 10 MG tablet Take 2 tablets (20 mg total) by mouth 2 (two) times daily as needed for severe pain (pain score 7-10).   methadone  (DOLOPHINE ) 10 MG tablet Take 2 tablets (20 mg total) by mouth 2 (two) times daily as needed for severe pain (pain score 7-10).   naloxone  (NARCAN ) nasal spray 4  mg/0.1 mL 1 actuation in one nostril once. May repeat in 2-3 min   RABEprazole  (ACIPHEX ) 20 MG tablet Take 1 tablet (20 mg total) by mouth daily.   famotidine  (PEPCID ) 40 MG tablet Take 1 tablet (40 mg total) by mouth daily. (Patient not taking: Reported on 04/09/2024)   loratadine  (CLARITIN ) 10 MG tablet Take 1 tablet (10 mg total) by mouth daily. (Patient not taking: Reported on 04/09/2024)   tadalafil  (CIALIS ) 20 MG tablet Take 0.5-1 tablets (10-20 mg total) by mouth daily as needed for erectile dysfunction. (Patient not taking: Reported on 04/09/2024)   No facility-administered encounter medications on file as of 04/09/2024.    Allergies (verified) Penicillins, Ciprofloxacin, Metformin  and related, Rosuvastatin, Actos  [pioglitazone ], Jardiance  [empagliflozin ], and Influenza vaccines   History: Past Medical History:  Diagnosis Date   Allergy    Cancer (HCC)    COLON CANCER   Cataract    surgery - left eye only   Colon cancer, ascending (HCC) 06/24/2015   Constipation    Diabetes mellitus    Diverticulosis of colon    Fatty liver    Gallstones    Possible vs sludge   GERD (gastroesophageal reflux disease)    Hypogonadism male    Hypotension    LBP (low back pain)    Osteoarthritis    lower back   Peripheral neuropathy    occasional   Seasonal allergies    Urinary frequency    Past Surgical History:  Procedure Laterality Date   APPENDECTOMY     CATARACT EXTRACTION Left    COLON SURGERY     COLONOSCOPY  04/2015   [facility]   EYE SURGERY Left    FOOT SURGERY  1966   Left   HEMORRHOID SURGERY  2011   [facility]   LAPAROSCOPIC PARTIAL COLECTOMY N/A 06/24/2015   Procedure: LAPAROSCOPIC PARTIAL COLECTOMY;  Surgeon: Debby Shipper, MD;  Location: [facility] OR;  Service: General;  Laterality: N/A;   LUMBAR LAMINECTOMY     PARTIAL COLECTOMY  06/24/2015   POLYPECTOMY     TONSILLECTOMY     Family History  Problem Relation Age of Onset   Heart disease Mother    Cancer Sister         Liver, biliary duct & tube   Hemochromatosis Sister    Stroke Sister    Colon polyps Sister    Colon cancer Paternal Uncle    Esophageal cancer Neg Hx    Stomach cancer Neg Hx    Rectal cancer Neg Hx    Social History   Socioeconomic History   Marital status: Married    Spouse name: Not on file   Number of children: Not on file   Years of education: Not on file   Highest education level: Not on file  Occupational History   Occupation: Retired    Associate Professor: RETIRED  Tobacco Use  Smoking status: Never   Smokeless tobacco: Never  Vaping Use   Vaping status: Never Used  Substance and Sexual Activity   Alcohol  use: Yes    Alcohol /week: 0.0 standard drinks of alcohol     Comment: rarely wine   Drug use: No   Sexual activity: Yes  Other Topics Concern   Not on file  Social History Narrative   Family history diabetes 1st degree relative   Family history hypertension   Family history of colon cancer: uncle         Caring for ill mother at home and a sister w/brain injury at a NH      Married, wife Darice for 20+ years   Retired as Psychologist, 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   Enjoys outdoors-fishing   Has no biological children, but # 3 step children   Has a cat, that rules the home-indoor cat (has claws)         Social Drivers of Corporate investment banker Strain: Low Risk  (04/09/2024)   Overall Financial Resource Strain (CARDIA)    Difficulty of Paying Living Expenses: Not hard at all  Food Insecurity: No Food Insecurity (04/09/2024)   Hunger Vital Sign    Worried About Running Out of Food in the Last Year: Never true    Ran Out of Food in the Last Year: Never true  Transportation Needs: No Transportation Needs (04/09/2024)   PRAPARE - Administrator, Civil Service (Medical): No    Lack of Transportation (Non-Medical): No  Physical Activity: Sufficiently Active (04/09/2024)   Exercise Vital Sign    Days of Exercise per Week: 7 days    Minutes  of Exercise per Session: 50 min  Stress: No Stress Concern Present (04/09/2024)   [employer] of Occupational Health - Occupational Stress Questionnaire    Feeling of Stress: Not at all  Social Connections: Socially Integrated (04/09/2024)   Social Connection and Isolation Panel    Frequency of Communication with Friends and Family: Three times a week    Frequency of Social Gatherings with Friends and Family: Three times a week    Attends Religious Services: More than 4 times per year    Active Member of Clubs or Organizations: Yes    Attends Engineer, structural: More than 4 times per year    Marital Status: Married    Tobacco Counseling Counseling given: No    Clinical Intake:  Pre-visit preparation completed: Yes  Pain : No/denies pain     BMI - recorded: 21.56 Nutritional Status: BMI of 19-24  Normal Nutritional Risks: None Diabetes: Yes CBG done?: Yes CBG resulted in Enter/ Edit results?: Yes (fasting - 119) Did pt. bring in CBG monitor from home?: No  Lab Results  Component Value Date   HGBA1C 8.5 (H) 04/08/2024   HGBA1C 8.3 (H) 01/03/2024   HGBA1C 8.7 (H) 10/02/2023     How often do you need to have someone help you when you read instructions, pamphlets, or other written materials from your doctor or pharmacy?: 1 - Never  Interpreter Needed?: No  Information entered by :: Verdie Saba, CMA   Activities of Daily Living      No data to display          Patient Care Team: Plotnikov, Karlynn GAILS, MD as PCP - General Vanderbilt Ned, MD as Consulting Physician (General Surgery) Armbruster, Elspeth SQUIBB, MD as Consulting Physician (Gastroenterology) Erasmo [facility] BRAVO, OD as Consulting  Physician (Optometry)  I have updated your Care Teams any recent Medical Services you may have received from other providers in the past year.     Assessment:   This is a routine wellness examination for Dshaun.  Hearing/Vision screen Hearing Screening -  Comments:: Wears hearing aids Vision Screening - Comments:: Wears rx glasses - up to date with routine eye exams with Dr Erasmo   Goals Addressed               This Visit's Progress     Patient Stated (pt-stated)        Patient stated he plans to continue watching his diet (Diabetic) and taking meds       Depression Screen     04/09/2024    3:58 PM 01/03/2024    1:42 PM 07/03/2023   11:38 AM 04/06/2023    3:45 PM 04/04/2023    1:19 PM 01/03/2023    1:20 PM 10/03/2022    1:25 PM  PHQ 2/9 Scores  PHQ - 2 Score 0 0 0 0 0 0 0  PHQ- 9 Score 0   0       Fall Risk     04/09/2024    3:57 PM 04/08/2024    1:57 PM 01/03/2024    1:42 PM 07/03/2023   11:38 AM 04/06/2023    3:41 PM  Fall Risk   Falls in the past year? 0 0 0 0 0  Number falls in past yr: 0 0 0 0 0  Injury with Fall? 0 0 0 0 0  Risk for fall due to : No Fall Risks No Fall Risks No Fall Risks No Fall Risks No Fall Risks  Follow up Falls evaluation completed;Falls prevention discussed Falls evaluation completed  Falls evaluation completed Falls prevention discussed    MEDICARE RISK AT HOME:  Medicare Risk at Home Any stairs in or around the home?: Yes If so, are there any without handrails?: No Home free of loose throw rugs in walkways, pet beds, electrical cords, etc?: Yes Adequate lighting in your home to reduce risk of falls?: Yes Life alert?: No Use of a cane, walker or w/c?: No Grab bars in the bathroom?: Yes Shower chair or bench in shower?: Yes Elevated toilet seat or a handicapped toilet?: No  TIMED UP AND GO:  Was the test performed?  No  Cognitive Function: 6CIT completed        04/09/2024    4:00 PM 04/06/2023    3:41 PM 02/25/2022    2:59 PM  6CIT Screen  What Year? 0 points 0 points 0 points  What month? 0 points 0 points 0 points  What time? 0 points 0 points 0 points  Count back from 20 0 points 0 points 0 points  Months in reverse 0 points 0 points 0 points  Repeat phrase 0 points 0  points 0 points  Total Score 0 points 0 points 0 points    Immunizations Immunization History  Administered Date(s) Administered   PFIZER(Purple Top)SARS-COV-2 Vaccination 09/10/2019, 06/26/2020   PNEUMOCOCCAL CONJUGATE-20 01/05/2021   Pneumococcal Conjugate-13 08/26/2014   Pneumococcal Polysaccharide-23 04/12/2010   Td 05/26/2014    Screening Tests Health Maintenance  Topic Date Due   Diabetic kidney evaluation - Urine ACR  Never done   FOOT EXAM  05/08/2012   Colonoscopy  07/26/2020   OPHTHALMOLOGY EXAM  04/23/2024   DTaP/Tdap/Td (2 - Tdap) 05/26/2024   HEMOGLOBIN A1C  10/06/2024   Diabetic kidney evaluation -  eGFR measurement  04/08/2025   Medicare Annual Wellness (AWV)  04/09/2025   Pneumococcal Vaccine: 50+ Years  Completed   HPV VACCINES  Aged Out   Meningococcal B Vaccine  Aged Out   COVID-19 Vaccine  Discontinued   Hepatitis C Screening  Discontinued   Zoster Vaccines- Shingrix  Discontinued    Health Maintenance Items Addressed:  I have recommended that this patient have a Flu shot but he declines at this time. I have discussed the risks and benefits of this procedure with him. The patient verbalizes understanding.   Additional Screening:  Vision Screening: Recommended annual ophthalmology exams for early detection of glaucoma and other disorders of the eye. Is the patient up to date with their annual eye exam?  Yes  Who is the provider or what is the name of the office in which the patient attends annual eye exams? [facility]  Dental Screening: Recommended annual dental exams for proper oral hygiene  Community Resource Referral / Chronic Care Management: CRR required this visit?  No   CCM required this visit?  No   Plan:    I have personally reviewed and noted the following in the patient's chart:   Medical and social history Use of alcohol , tobacco or illicit drugs  Current medications and supplements including opioid prescriptions. Patient is not  currently taking opioid prescriptions. Functional ability and status Nutritional status Physical activity Advanced directives List of other physicians Hospitalizations, surgeries, and ER visits in previous 12 months Vitals Screenings to include cognitive, depression, and falls Referrals and appointments  In addition, I have reviewed and discussed with patient certain preventive protocols, quality metrics, and best practice recommendations. A written personalized care plan for preventive services as well as general preventive health recommendations were provided to patient.   Verdie CHRISTELLA Saba, CMA   04/09/2024   After Visit Summary: (Declined) Due to this being a telephonic visit, with patients personalized plan was offered to patient but patient Declined AVS at this time   Notes: Nothing significant to report at this time.  Medical screening examination/treatment/procedure(s) were performed by non-physician practitioner and as supervising physician I was immediately available for consultation/collaboration.  I agree with above. Karlynn Noel, MD

## 2024-07-16 ENCOUNTER — Encounter: Payer: Self-pay | Admitting: Internal Medicine

## 2024-07-16 ENCOUNTER — Ambulatory Visit: Admitting: Internal Medicine

## 2024-07-16 VITALS — BP 120/60 | HR 86 | Temp 98.3°F | Ht 72.0 in | Wt 158.0 lb

## 2024-07-16 DIAGNOSIS — M5440 Lumbago with sciatica, unspecified side: Secondary | ICD-10-CM

## 2024-07-16 DIAGNOSIS — Z85038 Personal history of other malignant neoplasm of large intestine: Secondary | ICD-10-CM | POA: Diagnosis not present

## 2024-07-16 DIAGNOSIS — G8929 Other chronic pain: Secondary | ICD-10-CM | POA: Diagnosis not present

## 2024-07-16 DIAGNOSIS — Z794 Long term (current) use of insulin: Secondary | ICD-10-CM | POA: Diagnosis not present

## 2024-07-16 DIAGNOSIS — E119 Type 2 diabetes mellitus without complications: Secondary | ICD-10-CM | POA: Diagnosis not present

## 2024-07-16 LAB — CBC WITH DIFFERENTIAL/PLATELET
Basophils Absolute: 0 K/uL (ref 0.0–0.1)
Basophils Relative: 0.3 % (ref 0.0–3.0)
Eosinophils Absolute: 0.1 K/uL (ref 0.0–0.7)
Eosinophils Relative: 1.3 % (ref 0.0–5.0)
HCT: 40.9 % (ref 39.0–52.0)
Hemoglobin: 13.9 g/dL (ref 13.0–17.0)
Lymphocytes Relative: 27.4 % (ref 12.0–46.0)
Lymphs Abs: 1.6 K/uL (ref 0.7–4.0)
MCHC: 33.9 g/dL (ref 30.0–36.0)
MCV: 94.9 fl (ref 78.0–100.0)
Monocytes Absolute: 0.6 K/uL (ref 0.1–1.0)
Monocytes Relative: 10.4 % (ref 3.0–12.0)
Neutro Abs: 3.5 K/uL (ref 1.4–7.7)
Neutrophils Relative %: 60.6 % (ref 43.0–77.0)
Platelets: 232 K/uL (ref 150.0–400.0)
RBC: 4.31 Mil/uL (ref 4.22–5.81)
RDW: 12.4 % (ref 11.5–15.5)
WBC: 5.7 K/uL (ref 4.0–10.5)

## 2024-07-16 LAB — URINALYSIS
Bilirubin Urine: NEGATIVE
Hgb urine dipstick: NEGATIVE
Ketones, ur: NEGATIVE
Leukocytes,Ua: NEGATIVE
Nitrite: NEGATIVE
Specific Gravity, Urine: 1.01 (ref 1.000–1.030)
Total Protein, Urine: NEGATIVE
Urine Glucose: NEGATIVE
Urobilinogen, UA: 0.2 (ref 0.0–1.0)
pH: 7.5 (ref 5.0–8.0)

## 2024-07-16 LAB — LIPID PANEL
Cholesterol: 165 mg/dL (ref 28–200)
HDL: 35.8 mg/dL — ABNORMAL LOW
LDL Cholesterol: 106 mg/dL — ABNORMAL HIGH (ref 10–99)
NonHDL: 129.03
Total CHOL/HDL Ratio: 5
Triglycerides: 115 mg/dL (ref 10.0–149.0)
VLDL: 23 mg/dL (ref 0.0–40.0)

## 2024-07-16 LAB — COMPREHENSIVE METABOLIC PANEL WITH GFR
ALT: 19 U/L (ref 3–53)
AST: 21 U/L (ref 5–37)
Albumin: 4.3 g/dL (ref 3.5–5.2)
Alkaline Phosphatase: 72 U/L (ref 39–117)
BUN: 13 mg/dL (ref 6–23)
CO2: 34 meq/L — ABNORMAL HIGH (ref 19–32)
Calcium: 9.4 mg/dL (ref 8.4–10.5)
Chloride: 100 meq/L (ref 96–112)
Creatinine, Ser: 0.76 mg/dL (ref 0.40–1.50)
GFR: 84.45 mL/min
Glucose, Bld: 105 mg/dL — ABNORMAL HIGH (ref 70–99)
Potassium: 4.1 meq/L (ref 3.5–5.1)
Sodium: 138 meq/L (ref 135–145)
Total Bilirubin: 0.6 mg/dL (ref 0.2–1.2)
Total Protein: 7.5 g/dL (ref 6.0–8.3)

## 2024-07-16 LAB — MICROALBUMIN / CREATININE URINE RATIO
Creatinine,U: 52.9 mg/dL
Microalb Creat Ratio: UNDETERMINED mg/g (ref 0.0–30.0)
Microalb, Ur: <0.7 mg/dL

## 2024-07-16 LAB — HEMOGLOBIN A1C: Hgb A1c MFr Bld: 8.1 % — ABNORMAL HIGH (ref 4.6–6.5)

## 2024-07-16 MED ORDER — METHADONE HCL 10 MG PO TABS: 20.0000 mg | ORAL_TABLET | Freq: Two times a day (BID) | ORAL | 0 refills | Status: AC | PRN

## 2024-07-16 MED ORDER — DIAZEPAM 5 MG PO TABS: ORAL_TABLET | ORAL | 2 refills | Status: AC

## 2024-07-16 MED ORDER — LANTUS SOLOSTAR 100 UNIT/ML ~~LOC~~ SOPN: 10.0000 [IU] | PEN_INJECTOR | Freq: Every day | SUBCUTANEOUS | 11 refills | Status: AC

## 2024-07-16 NOTE — Assessment & Plan Note (Signed)
 Monitor labs AFP q 12 mo Keep Hct <45%

## 2024-07-16 NOTE — Progress Notes (Signed)
 "  Subjective:  Patient ID: Gabriel Erickson, male    DOB: 1941-04-17  Age: 83 y.o. MRN: [id number]  CC: Medical Management of Chronic Issues (3 mnth f/u )   HPI Gabriel Erickson presents for LBP, HTN, GERD Using manuka honey  Outpatient Medications Prior to Visit  Medication Sig Dispense Refill   Alcohol  Swabs  (DROPSAFE ALCOHOL  PREP) 70 % PADS USE TO CLEAN FINGER BEFORE CHECKING BLOOD SUGAR LEVEL TWO TIMES DAILY 200 each 3   b complex vitamins tablet Take 1 tablet by mouth daily.     Blood Glucose Calibration (TRUE METRIX LEVEL 2) Normal SOLN Use to calibrate meter 1 each 3   Blood Glucose Monitoring Suppl (TRUE METRIX AIR GLUCOSE METER) w/Device KIT Use as directed to check blood sugars 1 kit 0   Cholecalciferol 1000 UNITS tablet Take 1,000 Units by mouth daily.     DROPLET PEN NEEDLES 32G X 4 MM MISC USE AS DIRECTED 300 each 3   fluticasone  (FLONASE ) 50 MCG/ACT nasal spray Place 2 sprays into both nostrils daily. 16 g 11   glimepiride  (AMARYL ) 4 MG tablet Take 1 tablet (4 mg total) by mouth 2 (two) times daily. 180 tablet 3   glucose blood (TRUE METRIX BLOOD GLUCOSE TEST) test strip TEST BLOOD SUGAR TWICE DAILY 200 strip 3   Lancets Ultra Thin 30G MISC TEST BLOOD SUGAR TWICE DAILY 200 each 3   naloxone  (NARCAN ) nasal spray 4 mg/0.1 mL 1 actuation in one nostril once. May repeat in 2-3 min 1 each 2   RABEprazole  (ACIPHEX ) 20 MG tablet Take 1 tablet (20 mg total) by mouth daily. 90 tablet 3   diazepam  (VALIUM ) 5 MG tablet TAKE ONE TABLET TWICE DAILY AS NEEDED FOR muscle spasms, cramps, or insomnia 60 tablet 2   insulin  glargine (LANTUS  SOLOSTAR) 100 UNIT/ML Solostar Pen Inject 10 Units into the skin daily. Titrate up by 1 unit every 1-2 days for goal sugar  (CBG) 100-130 15 mL 11   methadone  (DOLOPHINE ) 10 MG tablet Take 2 tablets (20 mg total) by mouth 2 (two) times daily as needed for severe pain (pain score 7-10). For pain M54.5 120 tablet 0   methadone  (DOLOPHINE ) 10 MG tablet Take 2 tablets (20  mg total) by mouth 2 (two) times daily as needed for severe pain (pain score 7-10). 120 tablet 0   methadone  (DOLOPHINE ) 10 MG tablet Take 2 tablets (20 mg total) by mouth 2 (two) times daily as needed for severe pain (pain score 7-10). 120 tablet 0   famotidine  (PEPCID ) 40 MG tablet Take 1 tablet (40 mg total) by mouth daily. (Patient not taking: Reported on 07/16/2024) 90 tablet 3   loratadine  (CLARITIN ) 10 MG tablet Take 1 tablet (10 mg total) by mouth daily. (Patient not taking: Reported on 07/16/2024) 90 tablet 3   tadalafil  (CIALIS ) 20 MG tablet Take 0.5-1 tablets (10-20 mg total) by mouth daily as needed for erectile dysfunction. (Patient not taking: Reported on 07/16/2024) 30 tablet 3   No facility-administered medications prior to visit.    ROS: Review of Systems  Constitutional:  Negative for appetite change, fatigue and unexpected weight change.  HENT:  Negative for congestion, nosebleeds, sneezing, sore throat and trouble swallowing.   Eyes:  Negative for itching and visual disturbance.  Respiratory:  Negative for cough.   Cardiovascular:  Negative for chest pain, palpitations and leg swelling.  Gastrointestinal:  Negative for abdominal distention, blood in stool, diarrhea and nausea.  Genitourinary:  Negative for  frequency and hematuria.  Musculoskeletal:  Positive for back pain and gait problem. Negative for joint swelling and neck pain.  Skin:  Negative for rash.  Neurological:  Negative for dizziness, tremors, speech difficulty and weakness.  Psychiatric/Behavioral:  Negative for agitation, dysphoric mood and sleep disturbance. The patient is not nervous/anxious.     Objective:  BP 120/60   Pulse 86   Temp 98.3 F (36.8 C) (Oral)   Ht 6' (1.829 m)   Wt 158 lb (71.7 kg)   SpO2 97%   BMI 21.43 kg/m   BP Readings from Last 3 Encounters:  07/16/24 120/60  04/08/24 130/86  01/03/24 132/86    Wt Readings from Last 3 Encounters:  07/16/24 158 lb (71.7 kg)  04/09/24  159 lb (72.1 kg)  04/08/24 159 lb 12.8 oz (72.5 kg)    Physical Exam Constitutional:      General: He is not in acute distress.    Appearance: Normal appearance. He is well-developed.     Comments: NAD  Eyes:     Conjunctiva/sclera: Conjunctivae normal.     Pupils: Pupils are equal, round, and reactive to light.  Neck:     Thyroid : No thyromegaly.     Vascular: No JVD.  Cardiovascular:     Rate and Rhythm: Normal rate and regular rhythm.     Heart sounds: Normal heart sounds. No murmur heard.    No friction rub. No gallop.  Pulmonary:     Effort: Pulmonary effort is normal. No respiratory distress.     Breath sounds: Normal breath sounds. No wheezing or rales.  Chest:     Chest wall: No tenderness.  Abdominal:     General: Bowel sounds are normal. There is no distension.     Palpations: Abdomen is soft. There is no mass.     Tenderness: There is no abdominal tenderness. There is no guarding or rebound.  Musculoskeletal:        General: No tenderness. Normal range of motion.     Cervical back: Normal range of motion.     Right lower leg: No edema.     Left lower leg: No edema.  Lymphadenopathy:     Cervical: No cervical adenopathy.  Skin:    General: Skin is warm and dry.     Findings: No lesion or rash.  Neurological:     Mental Status: He is alert and oriented to person, place, and time.     Cranial Nerves: No cranial nerve deficit.     Motor: No abnormal muscle tone.     Coordination: Coordination abnormal.     Gait: Gait abnormal.     Deep Tendon Reflexes: Reflexes are normal and symmetric.  Psychiatric:        Behavior: Behavior normal.        Thought Content: Thought content normal.        Judgment: Judgment normal.   LS w/pain  Lab Results  Component Value Date   WBC 6.4 04/08/2024   HGB 14.9 04/08/2024   HCT 44.0 04/08/2024   PLT 246.0 04/08/2024   GLUCOSE 105 (H) 04/08/2024   CHOL 190 07/07/2022   TRIG 149.0 07/07/2022   HDL 38.80 (L) 07/07/2022    LDLDIRECT 142.3 04/12/2010   LDLCALC 121 (H) 07/07/2022   ALT 20 04/08/2024   AST 21 04/08/2024   NA 138 04/08/2024   K 4.5 04/08/2024   CL 100 04/08/2024   CREATININE 0.81 04/08/2024   BUN 19 04/08/2024  CO2 33 (H) 04/08/2024   TSH 0.69 07/03/2023   PSA 3.45 07/03/2023   HGBA1C 8.5 (H) 04/08/2024    US  THYROID  Result Date: 08/11/2022 CLINICAL DATA:  Prior ultrasound follow-up. TI-RADS category 3 nodule in the right mid gland currently under imaging surveillance. EXAM: THYROID  ULTRASOUND TECHNIQUE: Ultrasound examination of the thyroid  gland and adjacent soft tissues was performed. COMPARISON:  Prior thyroid  ultrasound 08/04/2021 FINDINGS: Parenchymal Echotexture: Markedly heterogenous Isthmus: 0.3 cm Right lobe: 4.7 x 2.3 x 2.6 cm Left lobe: 5.2 x 2.8 x 2.0 cm _________________________________________________________ Estimated total number of nodules >/= 1 cm: 2 Number of spongiform nodules >/=  2 cm not described below (TR1): 0 Number of mixed cystic and solid nodules >/= 1.5 cm not described below (TR2): 0 _________________________________________________________ Nodule # 1: Small isoechoic nodule in the right upper gland measures only 1.2 cm. This does not meet criteria for further evaluation. Nodule # 2: Vague region of pseudo nodularity in the right inferior gland. This lesion is not clearly defined from the background thyroid  parenchyma and is not considered a true thyroid  nodule. Nodule # 3: Continued stability of isoechoic solid nodule (TI-RADS category 3) which is very ill-defined in the left mid gland at 2.2 x 1.7 x 1.7 cm compared to 2.2 x 2.0 x 1.7 cm previously. *Given size (>/= 1.5 - 2.4 cm) and appearance, a follow-up ultrasound in 1 year should be considered based on TI-RADS criteria. No new nodules or suspicious features. IMPRESSION: 1. Confirmed 1 year stability of TI-RADS category 3 nodule in the left mid gland. Recommend continued follow-up ultrasound every 1-2 years until 5  year stability is confirmed. 2. Diffusely enlarged and heterogeneous thyroid  gland. 3. No new nodules or suspicious features. The above is in keeping with the ACR TI-RADS recommendations - J Am Coll Radiol 2017;14:587-595. Electronically Signed   By: Wilkie Lent M.D.   On: 08/11/2022 09:07    Assessment & Plan:   Problem List Items Addressed This Visit     Diabetes mellitus type 2, controlled (HCC) - Primary   Using manuka honey Cont on Glimepiride  Gabriel Erickson wants to stay on it), Lantus  12 u/d. Pt declined CGM Check labs again      Relevant Medications   insulin  glargine (LANTUS  SOLOSTAR) 100 UNIT/ML Solostar Pen   Other Relevant Orders   Urine microalbumin-creatinine with uACR   Hemoglobin A1c   Comprehensive metabolic panel with GFR   CBC with Differential/Platelet   CEA   AFP tumor marker   Urinalysis   Hemoglobin A1c   Microalbumin / creatinine urine ratio   Lipid panel   CBC with Differential/Platelet   Hemochromatosis   Monitor labs AFP q 12 mo Keep Hct <45%      Relevant Orders   Urine microalbumin-creatinine with uACR   Hemoglobin A1c   Comprehensive metabolic panel with GFR   CBC with Differential/Platelet   CEA   AFP tumor marker   Urinalysis   Hemoglobin A1c   Microalbumin / creatinine urine ratio   Lipid panel   CBC with Differential/Platelet   LOW BACK PAIN   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 addiction risk, apnea etc) and complications (i.e. Somnolence, constipation and others) were explained to the patient and were aknowledged. 3/20 - reducing the dose per Arnell's request w/caution Diazepam  Benzo use risk is discussed - taking very little Narcane option  Relevant Medications   methadone  (DOLOPHINE ) 10 MG tablet   methadone  (DOLOPHINE ) 10 MG tablet   methadone  (DOLOPHINE ) 10 MG tablet   Other Relevant Orders   Urine  microalbumin-creatinine with uACR   Hemoglobin A1c   Comprehensive metabolic panel with GFR   CBC with Differential/Platelet   CEA   AFP tumor marker   Urinalysis   Hemoglobin A1c   Microalbumin / creatinine urine ratio   Lipid panel   CBC with Differential/Platelet   History of colon cancer   Relevant Orders   CEA   AFP tumor marker      Meds ordered this encounter  Medications   diazepam  (VALIUM ) 5 MG tablet    Sig: TAKE ONE TABLET TWICE DAILY AS NEEDED FOR muscle spasms, cramps, or insomnia    Dispense:  60 tablet    Refill:  2   methadone  (DOLOPHINE ) 10 MG tablet    Sig: Take 2 tablets (20 mg total) by mouth 2 (two) times daily as needed for severe pain (pain score 7-10). For pain M54.5    Dispense:  120 tablet    Refill:  0    Please fill on or after 07/16/24. Code: M54.50   methadone  (DOLOPHINE ) 10 MG tablet    Sig: Take 2 tablets (20 mg total) by mouth 2 (two) times daily as needed for severe pain (pain score 7-10).    Dispense:  120 tablet    Refill:  0    Please fill on or after 08/15/24. Code: M54.50   methadone  (DOLOPHINE ) 10 MG tablet    Sig: Take 2 tablets (20 mg total) by mouth 2 (two) times daily as needed for severe pain (pain score 7-10).    Dispense:  120 tablet    Refill:  0    Please fill on or after 09/14/24. Code: M54.50   insulin  glargine (LANTUS  SOLOSTAR) 100 UNIT/ML Solostar Pen    Sig: Inject 10 Units into the skin daily. Titrate up by 1 unit every 1-2 days for goal sugar  (CBG) 100-130    Dispense:  15 mL    Refill:  11      Follow-up: Return in about 3 months (around 10/14/2024) for a follow-up visit.  Marolyn Noel, MD "

## 2024-07-16 NOTE — Assessment & Plan Note (Signed)
 Using manuka honey Cont on Glimepiride  Gabriel Erickson wants to stay on it), Lantus  12 u/d. Pt declined CGM Check labs again

## 2024-07-16 NOTE — Assessment & Plan Note (Signed)
 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 addiction risk, apnea etc) and complications (i.e. Somnolence, constipation and others) were explained to the patient and were aknowledged. 3/20 - reducing the dose per Jareth's request w/caution Diazepam Benzo use risk is discussed - taking very little Narcane option

## 2024-07-16 NOTE — Patient Instructions (Signed)

## 2024-07-19 ENCOUNTER — Ambulatory Visit: Payer: Self-pay | Admitting: Internal Medicine

## 2024-07-20 LAB — CEA: CEA: 3 ng/mL — ABNORMAL HIGH

## 2024-07-20 LAB — AFP TUMOR MARKER: AFP-Tumor Marker: 1.5 ng/mL

## 2024-10-15 ENCOUNTER — Ambulatory Visit: Admitting: Internal Medicine

## 2025-04-11 ENCOUNTER — Ambulatory Visit
